# Patient Record
Sex: Female | Born: 1955
Health system: Southern US, Community
[De-identification: ages and names within clinical notes are randomized; demographics above are authoritative.]

## PROBLEM LIST (undated history)

## (undated) DIAGNOSIS — I341 Nonrheumatic mitral (valve) prolapse: Secondary | ICD-10-CM

## (undated) DIAGNOSIS — R9431 Abnormal electrocardiogram [ECG] [EKG]: Secondary | ICD-10-CM

## (undated) DIAGNOSIS — R202 Paresthesia of skin: Secondary | ICD-10-CM

## (undated) DIAGNOSIS — F319 Bipolar disorder, unspecified: Secondary | ICD-10-CM

## (undated) DIAGNOSIS — Z85828 Personal history of other malignant neoplasm of skin: Secondary | ICD-10-CM

## (undated) DIAGNOSIS — Z8619 Personal history of other infectious and parasitic diseases: Secondary | ICD-10-CM

## (undated) DIAGNOSIS — F32A Depression, unspecified: Secondary | ICD-10-CM

## (undated) DIAGNOSIS — B009 Herpesviral infection, unspecified: Secondary | ICD-10-CM

## (undated) DIAGNOSIS — U071 COVID-19: Secondary | ICD-10-CM

## (undated) DIAGNOSIS — K219 Gastro-esophageal reflux disease without esophagitis: Secondary | ICD-10-CM

## (undated) DIAGNOSIS — F419 Anxiety disorder, unspecified: Secondary | ICD-10-CM

## (undated) DIAGNOSIS — J42 Unspecified chronic bronchitis: Secondary | ICD-10-CM

## (undated) DIAGNOSIS — T7840XA Allergy, unspecified, initial encounter: Secondary | ICD-10-CM

## (undated) DIAGNOSIS — F329 Major depressive disorder, single episode, unspecified: Secondary | ICD-10-CM

## (undated) DIAGNOSIS — I471 Supraventricular tachycardia, unspecified: Secondary | ICD-10-CM

## (undated) DIAGNOSIS — M1990 Unspecified osteoarthritis, unspecified site: Secondary | ICD-10-CM

## (undated) DIAGNOSIS — H269 Unspecified cataract: Secondary | ICD-10-CM

## (undated) DIAGNOSIS — M810 Age-related osteoporosis without current pathological fracture: Secondary | ICD-10-CM

## (undated) DIAGNOSIS — R413 Other amnesia: Secondary | ICD-10-CM

## (undated) DIAGNOSIS — C801 Malignant (primary) neoplasm, unspecified: Secondary | ICD-10-CM

## (undated) DIAGNOSIS — S2239XA Fracture of one rib, unspecified side, initial encounter for closed fracture: Secondary | ICD-10-CM

## (undated) HISTORY — DX: Abnormal electrocardiogram (ECG) (EKG): R94.31

## (undated) HISTORY — PX: RHINOPLASTY: SHX2354

## (undated) HISTORY — DX: Age-related osteoporosis without current pathological fracture: M81.0

## (undated) HISTORY — DX: COVID-19: U07.1

## (undated) HISTORY — DX: Personal history of other malignant neoplasm of skin: Z85.828

## (undated) HISTORY — DX: Nonrheumatic mitral (valve) prolapse: I34.1

## (undated) HISTORY — DX: Gastro-esophageal reflux disease without esophagitis: K21.9

## (undated) HISTORY — DX: Herpesviral infection, unspecified: B00.9

## (undated) HISTORY — DX: Allergy, unspecified, initial encounter: T78.40XA

## (undated) HISTORY — PX: MOHS SURGERY: SHX181

## (undated) HISTORY — DX: Supraventricular tachycardia, unspecified: I47.10

## (undated) HISTORY — DX: Major depressive disorder, single episode, unspecified: F32.9

## (undated) HISTORY — DX: Paresthesia of skin: R20.2

## (undated) HISTORY — DX: Fracture of one rib, unspecified side, initial encounter for closed fracture: S22.39XA

## (undated) HISTORY — DX: Personal history of other infectious and parasitic diseases: Z86.19

## (undated) HISTORY — DX: Unspecified cataract: H26.9

## (undated) HISTORY — DX: Malignant (primary) neoplasm, unspecified: C80.1

## (undated) HISTORY — DX: Depression, unspecified: F32.A

## (undated) HISTORY — DX: Other amnesia: R41.3

## (undated) HISTORY — PX: TUBAL LIGATION: SHX77

## (undated) HISTORY — DX: Anxiety disorder, unspecified: F41.9

## (undated) HISTORY — DX: Bipolar disorder, unspecified: F31.9

## (undated) HISTORY — DX: Unspecified chronic bronchitis: J42

## (undated) HISTORY — DX: Unspecified osteoarthritis, unspecified site: M19.90

---

## 1978-09-30 DIAGNOSIS — Z85828 Personal history of other malignant neoplasm of skin: Secondary | ICD-10-CM

## 1978-09-30 HISTORY — DX: Personal history of other malignant neoplasm of skin: Z85.828

## 1999-05-04 ENCOUNTER — Other Ambulatory Visit: Admission: RE | Admit: 1999-05-04 | Discharge: 1999-05-04 | Payer: Self-pay | Admitting: *Deleted

## 1999-06-08 ENCOUNTER — Ambulatory Visit (HOSPITAL_COMMUNITY): Admission: RE | Admit: 1999-06-08 | Discharge: 1999-06-08 | Payer: Self-pay | Admitting: *Deleted

## 2000-05-26 ENCOUNTER — Other Ambulatory Visit: Admission: RE | Admit: 2000-05-26 | Discharge: 2000-05-26 | Payer: Self-pay | Admitting: *Deleted

## 2000-10-28 ENCOUNTER — Emergency Department (HOSPITAL_COMMUNITY): Admission: EM | Admit: 2000-10-28 | Discharge: 2000-10-28 | Payer: Self-pay | Admitting: Emergency Medicine

## 2001-01-05 ENCOUNTER — Encounter: Payer: Self-pay | Admitting: Internal Medicine

## 2001-01-05 ENCOUNTER — Ambulatory Visit (HOSPITAL_COMMUNITY): Admission: RE | Admit: 2001-01-05 | Discharge: 2001-01-05 | Payer: Self-pay | Admitting: Internal Medicine

## 2001-01-12 ENCOUNTER — Ambulatory Visit (HOSPITAL_BASED_OUTPATIENT_CLINIC_OR_DEPARTMENT_OTHER): Admission: RE | Admit: 2001-01-12 | Discharge: 2001-01-12 | Payer: Self-pay | Admitting: *Deleted

## 2001-01-12 ENCOUNTER — Encounter (INDEPENDENT_AMBULATORY_CARE_PROVIDER_SITE_OTHER): Payer: Self-pay | Admitting: *Deleted

## 2001-03-05 ENCOUNTER — Encounter (INDEPENDENT_AMBULATORY_CARE_PROVIDER_SITE_OTHER): Payer: Self-pay | Admitting: Specialist

## 2001-03-05 ENCOUNTER — Ambulatory Visit (HOSPITAL_BASED_OUTPATIENT_CLINIC_OR_DEPARTMENT_OTHER): Admission: RE | Admit: 2001-03-05 | Discharge: 2001-03-05 | Payer: Self-pay | Admitting: *Deleted

## 2001-07-13 ENCOUNTER — Other Ambulatory Visit: Admission: RE | Admit: 2001-07-13 | Discharge: 2001-07-13 | Payer: Self-pay | Admitting: *Deleted

## 2001-11-24 ENCOUNTER — Ambulatory Visit (HOSPITAL_COMMUNITY): Admission: RE | Admit: 2001-11-24 | Discharge: 2001-11-24 | Payer: Self-pay | Admitting: *Deleted

## 2001-11-24 ENCOUNTER — Encounter: Payer: Self-pay | Admitting: *Deleted

## 2001-12-08 ENCOUNTER — Encounter: Payer: Self-pay | Admitting: *Deleted

## 2001-12-08 ENCOUNTER — Ambulatory Visit (HOSPITAL_COMMUNITY): Admission: RE | Admit: 2001-12-08 | Discharge: 2001-12-08 | Payer: Self-pay | Admitting: *Deleted

## 2003-04-21 ENCOUNTER — Other Ambulatory Visit: Admission: RE | Admit: 2003-04-21 | Discharge: 2003-04-21 | Payer: Self-pay | Admitting: *Deleted

## 2004-09-26 ENCOUNTER — Ambulatory Visit: Payer: Self-pay | Admitting: Pulmonary Disease

## 2005-02-28 ENCOUNTER — Ambulatory Visit: Payer: Self-pay | Admitting: Pulmonary Disease

## 2005-03-01 ENCOUNTER — Ambulatory Visit: Payer: Self-pay | Admitting: Pulmonary Disease

## 2005-04-30 ENCOUNTER — Ambulatory Visit: Payer: Self-pay | Admitting: Internal Medicine

## 2006-09-03 ENCOUNTER — Ambulatory Visit: Payer: Self-pay | Admitting: Pulmonary Disease

## 2007-06-19 ENCOUNTER — Ambulatory Visit: Payer: Self-pay | Admitting: Pulmonary Disease

## 2007-06-19 DIAGNOSIS — R209 Unspecified disturbances of skin sensation: Secondary | ICD-10-CM | POA: Insufficient documentation

## 2007-06-19 DIAGNOSIS — K589 Irritable bowel syndrome without diarrhea: Secondary | ICD-10-CM | POA: Insufficient documentation

## 2007-06-19 DIAGNOSIS — J42 Unspecified chronic bronchitis: Secondary | ICD-10-CM | POA: Insufficient documentation

## 2007-06-19 DIAGNOSIS — Z8619 Personal history of other infectious and parasitic diseases: Secondary | ICD-10-CM | POA: Insufficient documentation

## 2007-06-19 DIAGNOSIS — F411 Generalized anxiety disorder: Secondary | ICD-10-CM | POA: Insufficient documentation

## 2007-06-19 DIAGNOSIS — B009 Herpesviral infection, unspecified: Secondary | ICD-10-CM | POA: Insufficient documentation

## 2007-06-19 DIAGNOSIS — R9431 Abnormal electrocardiogram [ECG] [EKG]: Secondary | ICD-10-CM | POA: Insufficient documentation

## 2007-06-19 DIAGNOSIS — J309 Allergic rhinitis, unspecified: Secondary | ICD-10-CM | POA: Insufficient documentation

## 2007-06-19 DIAGNOSIS — Z85828 Personal history of other malignant neoplasm of skin: Secondary | ICD-10-CM | POA: Insufficient documentation

## 2007-06-19 DIAGNOSIS — R413 Other amnesia: Secondary | ICD-10-CM | POA: Insufficient documentation

## 2007-06-19 DIAGNOSIS — I059 Rheumatic mitral valve disease, unspecified: Secondary | ICD-10-CM | POA: Insufficient documentation

## 2007-06-19 LAB — CONVERTED CEMR LAB
ALT: 15 units/L (ref 0–35)
Alkaline Phosphatase: 85 units/L (ref 39–117)
Basophils Absolute: 0 10*3/uL (ref 0.0–0.1)
Bilirubin Urine: NEGATIVE
CO2: 32 meq/L (ref 19–32)
Cholesterol: 172 mg/dL (ref 0–200)
Eosinophils Absolute: 0.1 10*3/uL (ref 0.0–0.6)
Glucose, Bld: 91 mg/dL (ref 70–99)
HCT: 37.5 % (ref 36.0–46.0)
Ketones, ur: NEGATIVE mg/dL
MCHC: 34.8 g/dL (ref 30.0–36.0)
Monocytes Absolute: 0.5 10*3/uL (ref 0.2–0.7)
Neutro Abs: 4 10*3/uL (ref 1.4–7.7)
Nitrite: NEGATIVE
RBC: 4.21 M/uL (ref 3.87–5.11)
Sodium: 144 meq/L (ref 135–145)
Total Bilirubin: 0.7 mg/dL (ref 0.3–1.2)
Total CHOL/HDL Ratio: 2.4
Total Protein: 7.7 g/dL (ref 6.0–8.3)
Urobilinogen, UA: 0.2 (ref 0.0–1.0)
WBC: 5.9 10*3/uL (ref 4.5–10.5)
pH: 6 (ref 5.0–8.0)

## 2007-07-27 ENCOUNTER — Ambulatory Visit: Payer: Self-pay | Admitting: Pulmonary Disease

## 2007-07-27 LAB — CONVERTED CEMR LAB
ALT: 14 units/L (ref 0–35)
Alkaline Phosphatase: 81 units/L (ref 39–117)
Bilirubin Urine: NEGATIVE
Calcium: 9.6 mg/dL (ref 8.4–10.5)
Creatinine, Ser: 0.7 mg/dL (ref 0.4–1.2)
GFR calc Af Amer: 113 mL/min
GFR calc non Af Amer: 94 mL/min
Ketones, ur: 15 mg/dL — AB
Urine Glucose: NEGATIVE mg/dL
Urobilinogen, UA: 0.2 (ref 0.0–1.0)
pH: 6 (ref 5.0–8.0)

## 2007-07-28 ENCOUNTER — Ambulatory Visit: Payer: Self-pay | Admitting: Cardiology

## 2007-07-28 ENCOUNTER — Inpatient Hospital Stay (HOSPITAL_COMMUNITY): Admission: EM | Admit: 2007-07-28 | Discharge: 2007-07-31 | Payer: Self-pay | Admitting: Emergency Medicine

## 2007-07-28 ENCOUNTER — Encounter (INDEPENDENT_AMBULATORY_CARE_PROVIDER_SITE_OTHER): Payer: Self-pay | Admitting: General Surgery

## 2007-08-01 HISTORY — PX: APPENDECTOMY: SHX54

## 2007-08-03 ENCOUNTER — Ambulatory Visit: Payer: Self-pay | Admitting: Pulmonary Disease

## 2007-08-03 LAB — CONVERTED CEMR LAB
BUN: 10 mg/dL (ref 6–23)
CO2: 30 meq/L (ref 19–32)
Calcium: 9.4 mg/dL (ref 8.4–10.5)
Creatinine, Ser: 0.7 mg/dL (ref 0.4–1.2)
GFR calc non Af Amer: 94 mL/min
Glucose, Bld: 79 mg/dL (ref 70–99)
Potassium: 4.8 meq/L (ref 3.5–5.1)

## 2007-08-04 ENCOUNTER — Ambulatory Visit: Payer: Self-pay | Admitting: Cardiology

## 2007-08-10 ENCOUNTER — Encounter: Payer: Self-pay | Admitting: Cardiology

## 2007-08-10 ENCOUNTER — Ambulatory Visit: Payer: Self-pay

## 2007-08-21 ENCOUNTER — Telehealth (INDEPENDENT_AMBULATORY_CARE_PROVIDER_SITE_OTHER): Payer: Self-pay | Admitting: *Deleted

## 2007-08-21 ENCOUNTER — Ambulatory Visit: Payer: Self-pay | Admitting: Pulmonary Disease

## 2007-08-21 LAB — CONVERTED CEMR LAB
Bilirubin Urine: NEGATIVE
Hemoglobin, Urine: NEGATIVE
Ketones, ur: NEGATIVE mg/dL
Leukocytes, UA: NEGATIVE
Specific Gravity, Urine: <=1.005 (ref 1.000–1.03)
Total Protein, Urine: NEGATIVE mg/dL
Urine Glucose: NEGATIVE mg/dL
pH: 6 (ref 5.0–8.0)

## 2007-09-03 ENCOUNTER — Ambulatory Visit: Payer: Self-pay | Admitting: Cardiology

## 2007-09-17 ENCOUNTER — Encounter: Payer: Self-pay | Admitting: Pulmonary Disease

## 2007-09-28 ENCOUNTER — Encounter: Payer: Self-pay | Admitting: Pulmonary Disease

## 2007-10-15 ENCOUNTER — Encounter: Payer: Self-pay | Admitting: Pulmonary Disease

## 2008-06-14 ENCOUNTER — Ambulatory Visit: Payer: Self-pay | Admitting: Internal Medicine

## 2008-06-27 ENCOUNTER — Ambulatory Visit: Payer: Self-pay | Admitting: Internal Medicine

## 2008-07-12 ENCOUNTER — Ambulatory Visit: Payer: Self-pay | Admitting: Pulmonary Disease

## 2008-08-08 ENCOUNTER — Ambulatory Visit: Payer: Self-pay | Admitting: Internal Medicine

## 2009-04-26 ENCOUNTER — Ambulatory Visit: Payer: Self-pay | Admitting: Pulmonary Disease

## 2009-06-26 ENCOUNTER — Ambulatory Visit: Payer: Self-pay | Admitting: Pulmonary Disease

## 2009-08-15 ENCOUNTER — Telehealth (INDEPENDENT_AMBULATORY_CARE_PROVIDER_SITE_OTHER): Payer: Self-pay | Admitting: *Deleted

## 2009-08-17 ENCOUNTER — Ambulatory Visit: Payer: Self-pay | Admitting: Pulmonary Disease

## 2009-08-22 ENCOUNTER — Ambulatory Visit: Payer: Self-pay | Admitting: Pulmonary Disease

## 2009-08-22 LAB — CONVERTED CEMR LAB
Alkaline Phosphatase: 63 units/L (ref 39–117)
BUN: 10 mg/dL (ref 6–23)
Basophils Absolute: 0 10*3/uL (ref 0.0–0.1)
Basophils Relative: 0.9 % (ref 0.0–3.0)
Bilirubin, Direct: 0.1 mg/dL (ref 0.0–0.3)
Calcium: 9.3 mg/dL (ref 8.4–10.5)
Cholesterol: 162 mg/dL (ref 0–200)
Creatinine, Ser: 0.7 mg/dL (ref 0.4–1.2)
GFR calc non Af Amer: 92.72 mL/min (ref >60)
HDL: 70.2 mg/dL (ref >39.00)
Hemoglobin: 13.1 g/dL (ref 12.0–15.0)
MCHC: 33.5 g/dL (ref 30.0–36.0)
Monocytes Absolute: 0.5 10*3/uL (ref 0.1–1.0)
Monocytes Relative: 11.7 % (ref 3.0–12.0)
Neutro Abs: 1.9 10*3/uL (ref 1.4–7.7)
Neutrophils Relative %: 48.9 % (ref 43.0–77.0)
RDW: 12.9 % (ref 11.5–14.6)
Sodium: 143 meq/L (ref 135–145)
Triglycerides: 62 mg/dL (ref 0.0–149.0)
VLDL: 12.4 mg/dL (ref 0.0–40.0)

## 2009-10-06 ENCOUNTER — Telehealth: Payer: Self-pay | Admitting: Pulmonary Disease

## 2009-10-06 DIAGNOSIS — M549 Dorsalgia, unspecified: Secondary | ICD-10-CM | POA: Insufficient documentation

## 2009-10-07 ENCOUNTER — Encounter: Payer: Self-pay | Admitting: Pulmonary Disease

## 2010-07-02 ENCOUNTER — Telehealth (INDEPENDENT_AMBULATORY_CARE_PROVIDER_SITE_OTHER): Payer: Self-pay | Admitting: *Deleted

## 2010-07-02 ENCOUNTER — Ambulatory Visit: Payer: Self-pay | Admitting: Pulmonary Disease

## 2010-07-02 DIAGNOSIS — J209 Acute bronchitis, unspecified: Secondary | ICD-10-CM | POA: Insufficient documentation

## 2010-09-13 ENCOUNTER — Ambulatory Visit: Payer: Self-pay | Admitting: Pulmonary Disease

## 2010-10-10 ENCOUNTER — Telehealth: Payer: Self-pay | Admitting: Pulmonary Disease

## 2010-10-15 ENCOUNTER — Ambulatory Visit
Admission: RE | Admit: 2010-10-15 | Discharge: 2010-10-15 | Payer: Self-pay | Source: Home / Self Care | Attending: Pulmonary Disease | Admitting: Pulmonary Disease

## 2010-10-18 ENCOUNTER — Other Ambulatory Visit: Payer: Self-pay | Admitting: Pulmonary Disease

## 2010-10-18 ENCOUNTER — Encounter: Payer: Self-pay | Admitting: Pulmonary Disease

## 2010-10-18 ENCOUNTER — Ambulatory Visit
Admission: RE | Admit: 2010-10-18 | Discharge: 2010-10-18 | Payer: Self-pay | Source: Home / Self Care | Attending: Pulmonary Disease | Admitting: Pulmonary Disease

## 2010-10-18 LAB — BASIC METABOLIC PANEL
BUN: 12 mg/dL (ref 6–23)
CO2: 28 mEq/L (ref 19–32)
Calcium: 8.9 mg/dL (ref 8.4–10.5)
Chloride: 104 mEq/L (ref 96–112)
Creatinine, Ser: 0.6 mg/dL (ref 0.4–1.2)
GFR: 106.19 mL/min (ref >60.00)
Glucose, Bld: 81 mg/dL (ref 70–99)
Potassium: 4.7 mEq/L (ref 3.5–5.1)
Sodium: 138 mEq/L (ref 135–145)

## 2010-10-18 LAB — CBC WITH DIFFERENTIAL/PLATELET
Basophils Absolute: 0.1 10*3/uL (ref 0.0–0.1)
Basophils Relative: 2.1 % (ref 0.0–3.0)
Eosinophils Absolute: 0.1 10*3/uL (ref 0.0–0.7)
Eosinophils Relative: 2.3 % (ref 0.0–5.0)
HCT: 39 % (ref 36.0–46.0)
Hemoglobin: 13.4 g/dL (ref 12.0–15.0)
Lymphocytes Relative: 25 % (ref 12.0–46.0)
Lymphs Abs: 1.3 10*3/uL (ref 0.7–4.0)
MCHC: 34.3 g/dL (ref 30.0–36.0)
MCV: 91.3 fl (ref 78.0–100.0)
Monocytes Absolute: 0.3 10*3/uL (ref 0.1–1.0)
Monocytes Relative: 6.4 % (ref 3.0–12.0)
Neutro Abs: 3.2 10*3/uL (ref 1.4–7.7)
Neutrophils Relative %: 64.2 % (ref 43.0–77.0)
Platelets: 192 10*3/uL (ref 150.0–400.0)
RBC: 4.27 Mil/uL (ref 3.87–5.11)
RDW: 13.5 % (ref 11.5–14.6)
WBC: 5 10*3/uL (ref 4.5–10.5)

## 2010-10-18 LAB — TSH: TSH: 1.26 u[IU]/mL (ref 0.35–5.50)

## 2010-10-18 LAB — LIPID PANEL
Cholesterol: 179 mg/dL (ref 0–200)
HDL: 74.9 mg/dL (ref >39.00)
LDL Cholesterol: 86 mg/dL (ref 0–99)
Total CHOL/HDL Ratio: 2
Triglycerides: 89 mg/dL (ref 0.0–149.0)
VLDL: 17.8 mg/dL (ref 0.0–40.0)

## 2010-10-18 LAB — HEPATIC FUNCTION PANEL
ALT: 16 U/L (ref 0–35)
AST: 22 U/L (ref 0–37)
Albumin: 4.1 g/dL (ref 3.5–5.2)
Alkaline Phosphatase: 73 U/L (ref 39–117)
Bilirubin, Direct: 0.1 mg/dL (ref 0.0–0.3)
Total Bilirubin: 0.4 mg/dL (ref 0.3–1.2)
Total Protein: 7 g/dL (ref 6.0–8.3)

## 2010-10-21 ENCOUNTER — Encounter: Payer: Self-pay | Admitting: Pulmonary Disease

## 2010-10-29 ENCOUNTER — Telehealth: Payer: Self-pay | Admitting: Pulmonary Disease

## 2010-11-01 NOTE — Progress Notes (Signed)
Summary: SICK NEEDS APPT  Phone Note Call from Patient Call back at Home Phone [phone]   Caller: Patient Call For: NADEL Summary of Call: PT IS SICK [place] WANTS AN APPT HAS COUGH /SINUS/TEMP/ACHE TAMMY IS FULL EXCEPT NURSE SPOTS + [place]  Initial call taken by: Lacinda Axon,  July 02, 2010 11:04 AM  Follow-up for Phone Call        [facility] Vernie Murders  July 02, 2010 11:13 AM  Spoke with pt.  She c/o "chest cold" x 2 days- has prod cough with ? color sputum.  She also c/o hoarness and sore throat since this am and states that she has low grade fever. Wants appt today and nothing available.  Pls advise thanks! NKDA Follow-up by: Vernie Murders,  July 02, 2010 11:20 AM  Additional Follow-up for Phone Call Additional follow up Details #1::        per SN---ok to add her on this afternoon to be seen Randell Loop CMA  July 02, 2010 12:54 PM .  Jeanene Erb, spoke with pt.  Informed her SN can see her today at 4pm -- pt ok with this.  Appt scheduled.  Gweneth Dimitri RN  July 02, 2010 1:39 PM

## 2010-11-01 NOTE — Progress Notes (Signed)
Summary: back aches  Phone Note Call from Patient Call back at Home Phone [phone]   Caller: Patient Call For: Monique [name] Reason for Call: Talk to Nurse Summary of Call: 2 weeks got bad feeling in back, been using heat, bengay and trammadol - pain has gotten worse, please respond.  does she need to come in? Initial call taken by: Eugene Gavia,  October 06, 2009 11:22 AM  Follow-up for Phone Call        Pt states she was exercising on new years eve, she was doing positive negative resistance training and was doing lateral pull downs. After that shebagan to have low back pain that has gradually gotten worse over the last 2 weeks. Pt has been applying heat, and using tramadol as needed for pain without releif. Pelase advise.Carron Curie CMA  October 06, 2009 11:38 AM   Additional Follow-up for Phone Call Additional follow up Details #1::        per SN---he would rec eval by sports meds--ortho specialist--for xrays, etc...refer to [facility]---thanks Randell Loop CMA  October 06, 2009 11:53 AM   New Problems: BACK PAIN (ICD-724.5)   Additional Follow-up for Phone Call Additional follow up Details #2::    Pt advised and orders placed. Zackery Barefoot CMA  October 06, 2009 12:30 PM   New Problems: BACK PAIN (ICD-724.5)

## 2010-11-01 NOTE — Progress Notes (Signed)
Summary: appt w/ [name]  Phone Note Call from Patient Call back at Home Phone [phone]   Caller: Patient Call For: [name] Summary of Call: Patient saw Dr. Edwin Dada a week ago.  Patient had a chest xray done at visit and was later left message on machine that she needed to f/u w/ Dr. Kriste Basque concerning xray results.  She accidentally erased message so she can not go into further detail.  Patient needing appt. w/Juventino Pavone. Initial call taken by: Lehman Prom,  October 10, 2010 3:39 PM  Follow-up for Phone Call        called and spoke with pt and she stated that she was sick and went to urgent care at [facility]---they did cxr and told her to follow up with SN due to spot seen on her lung and pleural thickening.  appt made for pt on 1-16 at 57,  pt is aware Randell Loop [facility]  October 10, 2010 3:46 PM

## 2010-11-01 NOTE — Letter (Signed)
Summary: [facility]  [facility]   Imported By: Lester Homecroft 10/20/2009 08:25:06  _____________________________________________________________________  External Attachment:    Type:   Image     Comment:   External Document

## 2010-11-01 NOTE — Assessment & Plan Note (Signed)
Summary: Acute NP office visit - sinus inf   CC:  red/sore throat, dry cough, sinus pressure/congestion, watery eyes, sneezing, PND, clear nasal drainage, "white bumps" on the inside of mouth, and chills x3-4days.  History of Present Illness: 55 year old with known history of  recurrent bronchitis, mitral valve prolapse, bipolar disorder, and anxiety.   July 12, 2008--Complains of nasal congestion, post nasal drip, cough, thick mucus x 4 days- Strep pharygitis-tx with 10 day of Pen Vee K.   August 08, 2008 --returns for follow up. feeling much better. Still complains of intermittent nasal drip and watery eyes.   April 26, 2009--Acute office viist. Complains of sinus congestion with clear drainage, headaches, PND with hacking cough, runny nose, eyes watering x2weeks.   June 26, 2009--Presents for an acute office visit. Complains of sore/itchy throat, head congestion with clear/watery drainage, ears itching, prod cough with clear mucus, wheezing, increased SOB, body aches x3days. otc not helping. Denies chest pain, dyspnea, orthopnea, hemoptysis, fever, n/v/d, edema, headache.  September 13, 2010 --Presents for red/sore throat, dry cough, sinus pressure/congestion, watery eyes, sneezing, PND, clear nasal drainage, "white bumps" on the inside of mouth, chills x3-4days. Started w/ stuffy nose, drainage, and sore throat. Feels tired, no energy. OTC not helping. Denies chest pain,  orthopnea, hemoptysis, fever, n/v/d, edema, headache.   Medications Prior to Update: 1)  Zyrtec Allergy 10 Mg Tabs (Cetirizine Hcl) .... Take 1 Tab By Mouth Once Daily in The Am... 2)  Flonase 50 Mcg/act Susp (Fluticasone Propionate) .... 2 Sp in Each Nostril At Bedtime.Marland KitchenMarland Kitchen 3)  Mucinex Maximum Strength 1200 Mg Xr12h-Tab (Guaifenesin) .... Take 1 Tab By Mouth Two Times A Day W/ Fluids.Marland KitchenMarland Kitchen 4)  Tramadol Hcl 50 Mg Tabs (Tramadol Hcl) .... Take 1 Tab By Mouth Every 6-8 H As Needed For Pain... 5)  Os-Cal 500 + D 500-200  Mg-Unit  Tabs (Calcium Carbonate-Vitamin D) .Marland Kitchen.. 1 Tab Two Times A Day 6)  Multivitamins   Tabs (Multiple Vitamin) .Marland Kitchen.. 1 Tab Daily 7)  Zoloft 50 Mg Tabs (Sertraline Hcl) .... 1.5 Tabs By Mouth Once Daily 8)  Lorazepam 0.5 Mg  Tabs (Lorazepam) .... 1.5 Tabs At Bedtime As Needed 9)  Augmentin 875-125 Mg Tabs (Amoxicillin-Pot Clavulanate) .... Take 1 Tab By Mouth Two Times A Day Til Gone... 10)  Prednisone (Pak) 5 Mg Tabs (Prednisone) .... Take As Directed 11)  Tussionex Pennkinetic Er 10-8 Mg/58ml Lqcr (Hydrocod Polst-Chlorphen Polst) .Marland Kitchen.. 1 Tsp By Mouth Two Times A Day As Needed For Cough...  Current Medications (verified): 1)  Zyrtec Allergy 10 Mg Tabs (Cetirizine Hcl) .... Take 1 Tab By Mouth Once Daily in The Am... 2)  Flonase 50 Mcg/act Susp (Fluticasone Propionate) .... 2 Sp in Each Nostril At Bedtime.Marland KitchenMarland Kitchen 3)  Mucinex Maximum Strength 1200 Mg Xr12h-Tab (Guaifenesin) .... Take 1 Tab By Mouth Two Times A Day W/ Fluids.Marland KitchenMarland Kitchen 4)  Tramadol Hcl 50 Mg Tabs (Tramadol Hcl) .... Take 1 Tab By Mouth Every 6-8 H As Needed For Pain... 5)  Os-Cal 500 + D 500-200 Mg-Unit  Tabs (Calcium Carbonate-Vitamin D) .Marland Kitchen.. 1 Tab Two Times A Day 6)  Multivitamins   Tabs (Multiple Vitamin) .Marland Kitchen.. 1 Tab Daily 7)  Zoloft 50 Mg Tabs (Sertraline Hcl) .... 1.5 Tabs By Mouth Once Daily 8)  Lorazepam 1 Mg Tabs (Lorazepam) .Marland Kitchen.. 1 - 1.5 Tabs By Mouth At Bedtime As Needed 9)  Tussionex Pennkinetic Er 10-8 Mg/8ml Lqcr (Hydrocod Polst-Chlorphen Polst) .Marland Kitchen.. 1 Tsp By Mouth Two Times A Day As  Needed For Cough...  Allergies (verified): No Known Drug Allergies  Past History:  Past Medical History: Last updated: 07/02/2010 RHINITIS, ALLERGIC NOS (ICD-477.9) BRONCHITIS, RECURRENT (ICD-491.9) MITRAL VALVE PROLAPSE (ICD-424.0) ABNORMAL ELECTROCARDIOGRAM (ICD-794.31) Hx of HEPATITIS C (ICD-070.51) IRRITABLE BOWEL SYNDROME (ICD-564.1) Family Hx of CANCER, COLORECTAL (ICD-154.0) HSV (ICD-054.9) SYMPTOM, MEMORY LOSS (ICD-780.93) Hx of  PARESTHESIA (ICD-782.0) DISORDER, BIPOLAR NOS (ICD-296.80) ANXIETY (ICD-300.00) SKIN CANCER, HX OF (ICD-V10.83)  Past Surgical History: Last updated: 07/02/2010 Rhinoplasty - 1983  Bilat Tubal Ligation S/P appendectomy 11/08  Family History: Last updated: 06/26/2009 emphysema - mother has COPD, pat uncle allergies - mother, father asthma - asthma heart disease - mother has a-fib, CHF rheumatism - father cancer - mother (colon), father (bladder) hypercholesterolemia - brother  Social History: Last updated: 07/02/2010 former smoker.  quit in 1998, 1ppd x15yrs. rare alcohol 1 cup caffeine daily. married no children  Risk Factors: Smoking Status: quit (07/02/2010)  Review of Systems      See HPI  Vital Signs:  Patient profile:   55 year old female Height:      67.5 inches Weight:      125.13 pounds BMI:     19.38 O2 Sat:      97 % on Room air Temp:     98.7 degrees F oral Pulse rate:   73 / minute BP sitting:   114 / 66  (left arm) Cuff size:   regular  Vitals Entered By: Boone Master CNA/MA (September 13, 2010 3:53 PM)  O2 Flow:  Room air  Physical Exam  Additional Exam:  GEN: A/Ox3; pleasant , NAD HEENT:  [place]/AT, , EACs-clear, TMs-wnl, NOSE-clear discharge, , non tender sinus, THROAT-clear NECK:  Supple w/ fair ROM; no JVD; normal carotid impulses w/o bruits; no thyromegaly or nodules palpated; no lymphadenopathy. CHEST:  CTA w/ no wheezing  HEART:  RRR, no m/r/g   ABDOMEN:  Soft & nt; nml bowel sounds; no organomegaly or masses detected. EXT: Warm bil,  no calf pain, edema, clubbing, pulses intact     Impression & Recommendations:  Problem # 1:  URI (ICD-465.9)  Mucinex DM two times a day as needed cough/congestion Saline nasal rinses as needed  Tylenol cold and sinus as needed sinus congestion  Increase fluids and rest.  Majic mouthwash as needed sore throat Omnicef 300mg  two times a day to have on hold if symptoms worsen with discolored mucus.    Please contact office for sooner follow up if symptoms do not improve or worsen  Her updated medication list for this problem includes:    Zyrtec Allergy 10 Mg Tabs (Cetirizine hcl) .Marland Kitchen... Take 1 tab by mouth once daily in the am...    Mucinex Maximum Strength 1200 Mg Xr12h-tab (Guaifenesin) .Marland Kitchen... Take 1 tab by mouth two times a day w/ fluids...    Tussionex Pennkinetic Er 10-8 Mg/76ml Lqcr (Hydrocod polst-chlorphen polst) .Marland Kitchen... 1 tsp by mouth two times a day as needed for cough...  Orders: Est. Patient Level III (40981)  Medications Added to Medication List This Visit: 1)  Lorazepam 1 Mg Tabs (Lorazepam) .Marland Kitchen.. 1 - 1.5 tabs by mouth at bedtime as needed 2)  First-dukes Mouthwash Susp (Diphenhyd-hydrocort-nystatin) .Marland Kitchen.. 1 tsp four times a day as needed sore throat, swish/swallow. 3)  Cefdinir 300 Mg Caps (Cefdinir) .Marland Kitchen.. 1 by mouth two times a day  Patient Instructions: 1)  Mucinex DM two times a day as needed cough/congestion 2)  Saline nasal rinses as needed  3)  Tylenol cold and sinus  as needed sinus congestion  4)  Increase fluids and rest.  5)  Majic mouthwash as needed sore throat 6)  Omnicef 300mg  two times a day to have on hold if symptoms worsen with discolored mucus.  7)  Please contact office for sooner follow up if symptoms do not improve or worsen  Prescriptions: CEFDINIR 300 MG CAPS (CEFDINIR) 1 by mouth two times a day  #14 x 0   Entered and Authorized by:   Rubye Oaks NP   Signed by:   Tammy Parrett NP on 09/13/2010   Method used:   Print then Give to Patient   RxID:   [id number] FIRST-DUKES MOUTHWASH  SUSP (DIPHENHYD-HYDROCORT-NYSTATIN) 1 tsp four times a day as needed sore throat, swish/swallow.  #4 oz x 0   Entered and Authorized by:   Rubye Oaks NP   Signed by:   Rubye Oaks NP on 09/13/2010   Method used:   Electronically to        [facility] (retail)       [address]       [place], [place]  [id number]       Ph: [phone]        Fax: [phone]   RxID:   [id number]    Immunization History:  Influenza Immunization History:    Influenza:  historical (06/30/2010)

## 2010-11-01 NOTE — Assessment & Plan Note (Signed)
Summary: chest cold - ok per [place] / cj   CC:  11 month ROV & add-on for URI....  History of Present Illness: 55 y/o WF here for a follow up visit... he has multiple medical problems as noted below...   ~  July 02, 2010:  Yearly ROV & add-on for URI symptoms> started w/sinus pressure, head & chest congestion, dry hacking cough, ?fever & chills, +swollen glands, and chest tightness x several days... taking OTC meds w/o relief & we discussed checking CXR (chr changes, NAD), & Rx w/ Depo/ dosepak/ Augmentin/ Tussionex... otherw notes stable> hx MVP- no CP, rare palpit, denies SOB etc... GI stable and she had her f/u colonoscopy 9/09> neg, no polyps & f/u 38yrs...  she still sees [name] on Zoloft & Lorazepam...    Problem List:  RHINITIS, ALLERGIC NOS (ICD-477.9) - uses Nasacort AQ as needed... she notes sinus & allergy problems on & off...  BRONCHITIS, RECURRENT (ICD-491.9) - exsmoker quit 1990's (20 yrs of up to 1.5ppd prior to that), uses Mucinex as needed  ~  CXR 10/11 showed sl hyperinflation, mild incr markings & apical scarring, NAD...  MITRAL VALVE PROLAPSE (ICD-424.0) - she has MSC on exam... she notes rare palpit (self limited), & Echo 1986 w/ mild MVP.  ~  2DEcho 11/08 showed norm LV w/ EF= 55-65% & no regional wall motion abn, mild thickening of MV w/ mild MR...  ABNORMAL ELECTROCARDIOGRAM (ICD-794.31) - poor R wave progression V1-V3, no known CAD  Hx of HEPATITIS C (ICD-070.51) - Hx IV drug abuse as teen; Abn LFT's in late 80's/ early 90's; liver Bx 93 w/ chr persist hepatitis w/o necrosis (DrPatterson); serology pos for HepB antibodies, neg HBSAg, pos HepCAb; had pos HepC PCR 2002 (genotype 2B) & Rx w/ interferon 9/02-3/03 ([name]) w/ complete disappearance of HepC viral RNA; all subseq tests have shown - nl LFT's,  pos HepCAb, & neg HepC RNA Quant PCR.  ~  labs 11/10 showed normal LFTs  IRRITABLE BOWEL SYNDROME (ICD-564.1)  Family Hx of CANCER, COLORECTAL (ICD-154.0) -  Mother w/ colon Ca age 22 (resected, no known recur); pt had normal colonoscopy 4/02, and f/u colonoscopy 9/09 was also neg- no polyps w/ f/u sched 2yrs...  HSV (ICD-054.9) - lab eval 5/08 by gyn [name])  SYMPTOM, MEMORY LOSS (ICD-780.93) - new complaint 9/08 w/ essent nl MMSE, refer to neuro fo rfurther testing per pt request... seen by [name] who felt her memory disturbance, fatigue, & pain were c/w Fibromyalgia> EEG was normal, MRI brain was normal, Sleep Study was relatively unremarkable, and Labs were neg including Lyme titer... Rx'd w/ Seroquel 50mg  Qhs, tried Provigil10mg /d, w/ consideration for use of Cymbalta or Adderall depending on response.  Hx of PARESTHESIA (ICD-782.0) - eval by neuro ([name]) nothing found.  DISORDER, BIPOLAR NOS (ICD-296.80) - Rx by [name] on Lamictal prev- off now &  taking ZOLOFT 100mg /d & LORAZEPAM 0.5mg - taking 1.5 tabs at bedtime...  ANXIETY (ICD-300.00) - takes Lorazepam as noted...  SKIN CANCER, HX OF (ICD-V10.83) - several basal cell carcinomas removed in past.   Preventive Screening-Counseling & Management  Alcohol-Tobacco     Smoking Status: quit     Year Started: 1974     Year Quit: 1999     Pack years: 1ppd x 20 yrs  Allergies (verified): No Known Drug Allergies  Comments:  Nurse/Medical Assistant: The patient's medications and allergies were reviewed with the patient and were updated in the Medication and Allergy Lists.  Past History:  Past  Medical History: RHINITIS, ALLERGIC NOS (ICD-477.9) BRONCHITIS, RECURRENT (ICD-491.9) MITRAL VALVE PROLAPSE (ICD-424.0) ABNORMAL ELECTROCARDIOGRAM (ICD-794.31) Hx of HEPATITIS C (ICD-070.51) IRRITABLE BOWEL SYNDROME (ICD-564.1) Family Hx of CANCER, COLORECTAL (ICD-154.0) HSV (ICD-054.9) SYMPTOM, MEMORY LOSS (ICD-780.93) Hx of PARESTHESIA (ICD-782.0) DISORDER, BIPOLAR NOS (ICD-296.80) ANXIETY (ICD-300.00) SKIN CANCER, HX OF (ICD-V10.83)  Past Surgical History: Rhinoplasty -  1983  Bilat Tubal Ligation S/P appendectomy 11/08  Family History: Reviewed history from 06/26/2009 and no changes required. emphysema - mother has COPD, pat uncle allergies - mother, father asthma - asthma heart disease - mother has a-fib, CHF rheumatism - father cancer - mother (colon), father (bladder) hypercholesterolemia - brother  Social History: Reviewed history from 04/26/2009 and no changes required. former smoker.  quit in 1998, 1ppd x72yrs. rare alcohol 1 cup caffeine daily. married no children  Review of Systems      See HPI       The patient complains of prolonged cough.  The patient denies anorexia, fever, weight loss, weight gain, vision loss, decreased hearing, hoarseness, chest pain, syncope, dyspnea on exertion, peripheral edema, headaches, hemoptysis, abdominal pain, melena, hematochezia, severe indigestion/heartburn, hematuria, incontinence, muscle weakness, suspicious skin lesions, transient blindness, difficulty walking, depression, unusual weight change, abnormal bleeding, enlarged lymph nodes, and angioedema.    Vital Signs:  Patient profile:   55 year old female Height:      67.5 inches Weight:      124.13 pounds BMI:     19.22 O2 Sat:      97 % on Room air Temp:     98.1 degrees F oral Pulse rate:   78 / minute BP sitting:   104 / 68  (left arm) Cuff size:   regular  Vitals Entered By: Randell Loop CMA (July 02, 2010 4:28 PM)  O2 Sat at Rest %:  97 O2 Flow:  Room air CC: 11 month ROV & add-on for URI... Is Patient Diabetic? No Pain Assessment Patient in pain? no      Comments no changes in meds today   Physical Exam  Additional Exam:  WD, WN, 55 y/o WF in NAD... GENERAL:  Alert & oriented; pleasant & cooperative... HEENT:  /AT, EOM-wnl, PERRLA, Fundi-benign, EACs-clear, TMs-wnl, NOSE-clear, THROAT-clear & wnl. NECK:  Supple w/ fairROM; no JVD; normal carotid impulses w/o bruits; no thyromegaly or nodules palpated; no  lymphadenopathy. CHEST:  Clear to P & A; without wheezes/ rales/ or rhonchi. HEART:  Regular Rhythm; without murmurs/ rubs/ or gallops. ABDOMEN:  Soft & nontender; normal bowel sounds; no organomegaly or masses detected. EXT: without deformities or arthritic changes; no varicose veins/ venous insuffic/ or edema; +triggers... NEURO:  CN's intact; motor testing normal; sensory testing normal; gait normal & balance OK. DERM:  No lesions noted; no rash etc...    CXR  Procedure date:  07/02/2010  Findings:      CHEST - 2 VIEW Comparison: 06/19/2007.   Findings: Chronic hyperinflation of the lungs.  Cardiac size and mediastinal contours are within normal limits.  Visualized tracheal air column is within normal limits.  Stable biapical pleural/parenchymal scarring.  No pneumothorax, pulmonary edema, pleural effusion or acute airspace opacity.  Diffuse increased interstitial markings are stable. No acute osseous abnormality identified.   IMPRESSION: Chronic pulmonary hyperinflation. No acute cardiopulmonary abnormality.   Read By:  Augusto Gamble,  M.D.   Impression & Recommendations:  Problem # 1:  ACUTE BRONCHITIS (ICD-466.0) She has an URI w/ sinus & bronchitic symptoms... CXR w/o acute changes... we discussed Rx  w/ Augmentin, Depo80, Pred dosepak, & Tussionex for Prn use... Her updated medication list for this problem includes:    Mucinex Maximum Strength 1200 Mg Xr12h-tab (Guaifenesin) .Marland Kitchen... Take 1 tab by mouth two times a day w/ fluids...    Augmentin 875-125 Mg Tabs (Amoxicillin-pot clavulanate) .Marland Kitchen... Take 1 tab by mouth two times a day til gone...    Tussionex Pennkinetic Er 10-8 Mg/10ml Lqcr (Hydrocod polst-chlorphen polst) .Marland Kitchen... 1 tsp by mouth two times a day as needed for cough...  Orders: T-2 View CXR (71020TC) Depo- Medrol 80mg  (J1040) Admin of Therapeutic Inj  intramuscular or subcutaneous (78469)  Problem # 2:  MITRAL VALVE PROLAPSE (ICD-424.0) Stable w/o signif  symptoms other than rare palpit which remains self limited etc...  Problem # 3:  Hx of HEPATITIS C (ICD-070.51) GI remains stable> up to date on colon & followed by [name]...  Problem # 4:  SYMPTOM, MEMORY LOSS (ICD-780.93) Prev eval from [name] w/ numerous tests, normal MMSE, felt to be poss CFS/ FM & pt denies on going symptoms & states she's doing well w/ Yoga...  Problem # 5:  DISORDER, BIPOLAR NOS (ICD-296.80) She remains under the care of [name] on Zoloft & Lorazepam...  Problem # 6:  OTHER MEDICAL PROBLEMS AS NOTED>>>  Complete Medication List: 1)  Zyrtec Allergy 10 Mg Tabs (Cetirizine hcl) .... Take 1 tab by mouth once daily in the am... 2)  Flonase 50 Mcg/act Susp (Fluticasone propionate) .... 2 sp in each nostril at bedtime.Marland KitchenMarland Kitchen 3)  Mucinex Maximum Strength 1200 Mg Xr12h-tab (Guaifenesin) .... Take 1 tab by mouth two times a day w/ fluids.Marland KitchenMarland Kitchen 4)  Tramadol Hcl 50 Mg Tabs (Tramadol hcl) .... Take 1 tab by mouth every 6-8 h as needed for pain.Marland KitchenMarland Kitchen 5)  Os-cal 500 + D 500-200 Mg-unit Tabs (Calcium carbonate-vitamin d) .Marland Kitchen.. 1 tab two times a day 6)  Multivitamins Tabs (Multiple vitamin) .Marland Kitchen.. 1 tab daily 7)  Zoloft 50 Mg Tabs (Sertraline hcl) .... 1.5 tabs by mouth once daily 8)  Lorazepam 0.5 Mg Tabs (Lorazepam) .... 1.5 tabs at bedtime as needed 9)  Augmentin 875-125 Mg Tabs (Amoxicillin-pot clavulanate) .... Take 1 tab by mouth two times a day til gone... 10)  Prednisone (pak) 5 Mg Tabs (Prednisone) .... Take as directed 11)  Tussionex Pennkinetic Er 10-8 Mg/50ml Lqcr (Hydrocod polst-chlorphen polst) .Marland Kitchen.. 1 tsp by mouth two times a day as needed for cough...  Patient Instructions: 1)  Today we updated your med list- see below.... 2)  We decided to treat your bronchitis & URI w/ AUGMENTIN - take one tab twice daily til gone... 3)  We will reduce the bronchial tube inflammation w/ the Depo shot & a Pred dosepak- take as directed til gone... 4)  Finally you may use the Tussionex  as needed for cough... 5)  Today we did your follow up CXR- please call the "phone tree" in a few days for your results.Marland KitchenMarland Kitchen 6)  Rest at home, drink plenty of fluids, try some Tylenol/ Mucinex/ hot Tea etc... 7)  Call for any questions... Prescriptions: AUGMENTIN 875-125 MG TABS (AMOXICILLIN-POT CLAVULANATE) take 1 tab by mouth two times a day til gone...  #14 x 0   Entered and Authorized by:   Michele Mcalpine MD   Signed by:   Michele Mcalpine MD on 07/02/2010   Method used:   Print then Give to Patient   RxID:   [id number] TUSSIONEX PENNKINETIC ER 10-8 MG/5ML LQCR (HYDROCOD POLST-CHLORPHEN POLST) 1 tsp by  mouth two times a day as needed for cough...  #4 oz x 3   Entered and Authorized by:   Michele Mcalpine MD   Signed by:   Michele Mcalpine MD on 07/02/2010   Method used:   Print then Give to Patient   RxID:   [id number] PREDNISONE (PAK) 5 MG TABS (PREDNISONE) take as directed  #5mg  6dpack x 0   Entered and Authorized by:   Michele Mcalpine MD   Signed by:   Michele Mcalpine MD on 07/02/2010   Method used:   Print then Give to Patient   RxID:   [id number]    Immunization History:  Influenza Immunization History:    Influenza:  historical (07/10/2009)    Medication Administration  Injection # 1:    Medication: Depo- Medrol 80mg     Diagnosis: ACUTE BRONCHITIS (ICD-466.0)    Route: IM    Site: LUOQ gluteus    Exp Date: 12/2012    Lot #: [id number]    Mfr: [employer]    Patient tolerated injection without complications    Given by: Randell Loop CMA (July 02, 2010 5:37 PM)  Orders Added: 1)  Est. Patient Level V [84696] 2)  T-2 View CXR [71020TC] 3)  Depo- Medrol 80mg  [J1040] 4)  Admin of Therapeutic Inj  intramuscular or subcutaneous [96372]   Appended Document: chest cold - ok per [place] / cj "Phone Tree" message recorded... SN

## 2010-11-01 NOTE — Assessment & Plan Note (Signed)
Summary: follow up from cxr done at [facility]   CC:  3 month ROV & add-on for abn CXR....  History of Present Illness: 55 y/o WF here for a follow up visit... he has multiple medical problems as noted below...   ~  July 02, 2010:  Yearly ROV & add-on for URI symptoms> started w/sinus pressure, head & chest congestion, dry hacking cough, ?fever & chills, +swollen glands, and chest tightness x several days... taking OTC meds w/o relief & we discussed checking CXR (chr changes, NAD), & Rx w/ Depo/ dosepak/ Augmentin/ Tussionex... otherw notes stable> hx MVP- no CP, rare palpit, denies SOB etc... GI stable and she had her f/u colonoscopy 9/09> neg, no polyps & f/u 62yrs...  she still sees [name] on Zoloft & Lorazepam...   ~  October 15, 2010:  she went to [facility] 10/02/10 w/ URI "I couldn't talk" & had CXR showing biapical pleuroparenchymal scarring & rather than checking old films in the [facility] sys they scared the bejesus out of her thinking she had pneumonia, lung ca, or worse... we did CXR 10/11 w/ no ch in this area over a 54yr period & she is reassured... incidently- no CP, cough, sputum, f/ c/ s, etc.    Problem List:  RHINITIS, ALLERGIC NOS (ICD-477.9) - uses FLONASE & ZYRTEK as needed... she notes sinus & allergy problems on & off... she wonders about "allergies" & we discussed checking IgE & Rast > pending.  BRONCHITIS, RECURRENT (ICD-491.9) - exsmoker quit 1990's (20 yrs of up to 1.5ppd prior to that), uses Mucinex as needed  ~  CXR 10/11 showed sl hyperinflation, mild incr markings & apical scarring, NAD...  ~  1/12:  CXR report from [facility] sounds similar to our prev films (not avail for review) see above.  MITRAL VALVE PROLAPSE (ICD-424.0) - she has MSC on exam... she notes rare palpit (self limited), & Echo 1986 w/ mild MVP.  ~  2DEcho 11/08 showed norm LV w/ EF= 55-65% & no regional wall motion abn, mild thickening of MV w/ mild MR...  ABNORMAL ELECTROCARDIOGRAM (ICD-794.31) - poor R wave  progression V1-V3, no known CAD  Hx of HEPATITIS C (ICD-070.51) - Hx IV drug abuse as teen; Abn LFT's in late 80's/ early 90's; liver Bx 93 w/ chr persist hepatitis w/o necrosis (DrPatterson); serology pos for HepB antibodies, neg HBSAg, pos HepCAb; had pos HepC PCR 2002 (genotype 2B) & Rx w/ interferon 9/02-3/03 ([name]) w/ complete disappearance of HepC viral RNA; all subseq tests have shown - nl LFT's,  pos HepCAb, & neg HepC RNA Quant PCR.  ~  labs 11/10 showed normal LFTs  IRRITABLE BOWEL SYNDROME (ICD-564.1)  Family Hx of CANCER, COLORECTAL (ICD-154.0) - Mother w/ colon Ca age 66 (resected, no known recur); pt had normal colonoscopy 4/02, and f/u colonoscopy 9/09 was also neg- no polyps w/ f/u sched 2yrs...  HSV (ICD-054.9) - lab eval 5/08 by gyn [name])  SYMPTOM, MEMORY LOSS (ICD-780.93) - new complaint 9/08 w/ essent nl MMSE, refer to neuro fo rfurther testing per pt request... seen by [name] who felt her memory disturbance, fatigue, & pain were c/w Fibromyalgia> EEG was normal, MRI brain was normal, Sleep Study was relatively unremarkable, and Labs were neg including Lyme titer... Rx'd w/ Seroquel 50mg  Qhs, tried Provigil10mg /d, w/ consideration for use of Cymbalta or Adderall depending on response.  Hx of PARESTHESIA (ICD-782.0) - eval by neuro ([name]) nothing found... she uses TRAMADOL 50mg  Prn.  DISORDER, BIPOLAR NOS (ICD-296.80) - Rx  by [name] on Lamictal prev- off now &  taking ZOLOFT 100mg /d & LORAZEPAM 0.5mg - taking 1.5 tabs at bedtime...  ANXIETY (ICD-300.00) - takes Lorazepam as noted...  SKIN CANCER, HX OF (ICD-V10.83) - several basal cell carcinomas removed in past.  Health Maintenance:  ~  GI:  followed by [name]> colonoscopy 9/09 was neg- no polyps seen, f/u 71yrs.  ~  GYN:  followed by [name] for PAP, Mammograms, BMD (?when done last & ?results)- Rx Calcium, MVI, Vit D).  ~  Immunizations:  she declines the yearly seasonal Flu  vaccine...   Preventive Screening-Counseling & Management  Alcohol-Tobacco     Smoking Status: quit     Year Started: 1974     Year Quit: 1999     Pack years: 1ppd x 20 yrs  Allergies (verified): No Known Drug Allergies  Comments:  Nurse/Medical Assistant: The patient's medications and allergies were reviewed with the patient and were updated in the Medication and Allergy Lists.  Past History:  Past Medical History: RHINITIS, ALLERGIC NOS (ICD-477.9) BRONCHITIS, RECURRENT (ICD-491.9) MITRAL VALVE PROLAPSE (ICD-424.0) ABNORMAL ELECTROCARDIOGRAM (ICD-794.31) Hx of HEPATITIS C (ICD-070.51) IRRITABLE BOWEL SYNDROME (ICD-564.1) Family Hx of CANCER, COLORECTAL (ICD-154.0) HSV (ICD-054.9) SYMPTOM, MEMORY LOSS (ICD-780.93) Hx of PARESTHESIA (ICD-782.0) DISORDER, BIPOLAR NOS (ICD-296.80) ANXIETY (ICD-300.00) SKIN CANCER, HX OF (ICD-V10.83)  Past Surgical History: Rhinoplasty - 1983  Bilat Tubal Ligation S/P appendectomy 11/08  Family History: Reviewed history from 06/26/2009 and no changes required. emphysema - mother has COPD, pat uncle allergies - mother, father asthma - asthma heart disease - mother has a-fib, CHF rheumatism - father cancer - mother (colon), father (bladder) hypercholesterolemia - brother  Social History: Reviewed history from 07/02/2010 and no changes required. former smoker.  quit in 1998, 1ppd x36yrs. rare alcohol 1 cup caffeine daily. married no children  Review of Systems      See HPI       The patient complains of dyspnea on exertion.  The patient denies anorexia, fever, weight loss, weight gain, vision loss, decreased hearing, hoarseness, chest pain, syncope, peripheral edema, prolonged cough, headaches, hemoptysis, abdominal pain, melena, hematochezia, severe indigestion/heartburn, hematuria, incontinence, muscle weakness, suspicious skin lesions, transient blindness, difficulty walking, depression, unusual weight change, abnormal  bleeding, enlarged lymph nodes, and angioedema.    Vital Signs:  Patient profile:   55 year old female Height:      67.5 inches Weight:      124.38 pounds BMI:     19.26 O2 Sat:      98 % on Room air Temp:     97.3 degrees F oral Pulse rate:   83 / minute BP sitting:   116 / 64  (left arm) Cuff size:   regular  Vitals Entered By: Randell Loop CMA (October 15, 2010 10:53 AM)  O2 Sat at Rest %:  98 O2 Flow:  Room air CC: 3 month ROV & add-on for abn CXR... Is Patient Diabetic? No Pain Assessment Patient in pain? no      Comments meds updated today with pt   Physical Exam  Additional Exam:  WD, WN, 55 y/o WF in NAD... she is somewhat anxious. GENERAL:  Alert & oriented; pleasant & cooperative... HEENT:  /AT, EOM-wnl, PERRLA, Fundi-benign, EACs-clear, TMs-wnl, NOSE-clear, THROAT-clear & wnl. NECK:  Supple w/ fairROM; no JVD; normal carotid impulses w/o bruits; no thyromegaly or nodules palpated; no lymphadenopathy. CHEST:  Clear to P & A; without wheezes/ rales/ or rhonchi. HEART:  Regular Rhythm; without murmurs/ rubs/ or gallops.  ABDOMEN:  Soft & nontender; normal bowel sounds; no organomegaly or masses detected. EXT: without deformities or arthritic changes; no varicose veins/ venous insuffic/ or edema; +triggers... NEURO:  CN's intact; motor testing normal; sensory testing normal; gait normal & balance OK. DERM:  No lesions noted; no rash etc...    Impression & Recommendations:  Problem # 1:  ABNORMAL CXR>>> She has chronic changes w/ biapical pleural thickening & no real ch serially from old films... she is also asymptomatic w/o cough, phlegm, hemoptysis, congestion, SOB, etc...  Problem # 2:  ACUTE URIS OF UNSPECIFIED SITE (ICD-465.9) This has resolved w/ prev Rx... Her updated medication list for this problem includes:    Zyrtec Allergy 10 Mg Tabs (Cetirizine hcl) .Marland Kitchen... Take 1 tab by mouth once daily in the am...    Mucinex Maximum Strength 1200 Mg Xr12h-tab  (Guaifenesin) .Marland Kitchen... Take 1 tab by mouth two times a day w/ fluids...    Tussionex Pennkinetic Er 10-8 Mg/63ml Lqcr (Hydrocod polst-chlorphen polst) .Marland Kitchen... 1 tsp by mouth two times a day as needed for cough...  Problem # 3:  BRONCHITIS, RECURRENT (ICD-491.9) Doing satis at present...  Problem # 4:  MULT MEDICAL PROBLEMS AS ADDRESSED ABOVE>>> She will ret for full fasting labs and IgE/ Rast test...  Complete Medication List: 1)  Zyrtec Allergy 10 Mg Tabs (Cetirizine hcl) .... Take 1 tab by mouth once daily in the am... 2)  Flonase 50 Mcg/act Susp (Fluticasone propionate) .... 2 sp in each nostril at bedtime.Marland KitchenMarland Kitchen 3)  Mucinex Maximum Strength 1200 Mg Xr12h-tab (Guaifenesin) .... Take 1 tab by mouth two times a day w/ fluids.Marland KitchenMarland Kitchen 4)  Tramadol Hcl 50 Mg Tabs (Tramadol hcl) .... Take 1 tab by mouth every 6-8 h as needed for pain.Marland KitchenMarland Kitchen 5)  Os-cal 500 + D 500-200 Mg-unit Tabs (Calcium carbonate-vitamin d) .Marland Kitchen.. 1 tab two times a day 6)  Multivitamins Tabs (Multiple vitamin) .Marland Kitchen.. 1 tab daily 7)  Zoloft 50 Mg Tabs (Sertraline hcl) .... 1.5 tabs by mouth once daily 8)  Lorazepam 1 Mg Tabs (Lorazepam) .Marland Kitchen.. 1 - 1.5 tabs by mouth at bedtime as needed 9)  Tussionex Pennkinetic Er 10-8 Mg/55ml Lqcr (Hydrocod polst-chlorphen polst) .Marland Kitchen.. 1 tsp by mouth two times a day as needed for cough... 10)  First-dukes Mouthwash Susp (Diphenhyd-hydrocort-nystatin) .Marland Kitchen.. 1 tsp four times a day as needed sore throat, swish/swallow.  Patient Instructions: 1)  Today we updated your med list- see below.... 2)  Please return to our lab one morning this week for your FASTING blood work, and we will include the allergy blood tests (RAST tests)... 3)  Then please call the "phone tree" in a few days for your lab results.Marland KitchenMarland Kitchen  4)  Call for any questions.Marland KitchenMarland Kitchen

## 2010-11-07 NOTE — Progress Notes (Signed)
Summary: lab results  Phone Note Call from Patient Call back at Home Phone [phone]   Caller: Patient Call For: Merriam Brandner Summary of Call: pt waiting for lab results.  Initial call taken by: Tivis Ringer, CNA,  October 29, 2010 1:29 PM  Follow-up for Phone Call        Pls advise lab results.  I have printed them. Thanks! Follow-up by: Vernie Murders,  October 29, 2010 3:35 PM  Additional Follow-up for Phone Call Additional follow up Details #1::        [email or web address] Randell Loop CMA  October 31, 2010 9:52 AM    Spoke with patient-she is aware of normal labs other than allergy rast test showing postive for cat dander.Reynaldo Minium CMA  October 31, 2010 4:14 PM

## 2011-01-11 ENCOUNTER — Telehealth: Payer: Self-pay | Admitting: Pulmonary Disease

## 2011-01-11 NOTE — Telephone Encounter (Signed)
Spoke w/ pt and she c/o of explosive diarrhea since this morning. Pt not sure if she is running fever but pt feels nauseous. Pt has not taking anything for this. Pt was last seen 10/15/10. Will froward to TP to see if she has any recs for pt. Please advise. Thanks.   KNDA   Carver Fila, CMA

## 2011-01-11 NOTE — Telephone Encounter (Signed)
?   abd pain ? Recent abx. ? Blood stools Suspect is viral illness.  Would increase fluids , may drink gatoratde Avoid lactose for 5 days May use immodium x 1 if needed.  Tylenol  As needed  Aches/chills.  Gas x As needed   If not improving will need ov or worsens will need ER Please contact office for sooner follow up if symptoms do not improve or worsen or seek emergency care

## 2011-01-11 NOTE — Telephone Encounter (Signed)
Pt denies any abd pain, recent abx use or blood in stools. She is aware of recs per TP and will seek emergency attention if her symptoms do not improve or get worse.

## 2011-01-23 ENCOUNTER — Other Ambulatory Visit: Payer: Self-pay | Admitting: Pulmonary Disease

## 2011-02-12 NOTE — Assessment & Plan Note (Signed)
[facility]                             PULMONARY OFFICE NOTE   NAME:Decuir, Vivia Ewing                    MRN:          [id number]  DATE:08/03/2007                            DOB:          [birth year]    HISTORY OF PRESENT ILLNESS:  Patient is a 55 year old white female  patient of Dr. Jodelle Green who has a known history of recurrent bronchitis,  mitral valve prolapse, bipolar disorder, and anxiety, who presents today  for an acute office visit.  Patient has recently been hospitalized one  week ago for an acute appendicitis, status post appendectomy.  She was  discharged on July 31, 2007.  The patient presents today complaining  that one day ago she noticed that she was having a pressure sensation  along the left anterior chest wall radiating to the axilla and post-  subscapular region.  The patient denied any increased shortness of  breath, diaphoresis, jaw pain, neck pain, or radiating arm pains.  Patient does complain that she does have some increased palpitations and  irregular heartbeat.  She has been having some difficulty with nausea  with her antibiotic, Augmentin, that she was discharged on.  Patient  does have a history with some mitral valve prolapse with previous  difficulties with recurrent palpitations; however, it on no regular  maintenance medications.  A previous 2D echo in 1986 showed mild MVP.  Previous EKGs have showed poor R wave progression in V1 and V3.  Patient  reports she has no known cardiac disease in her family.  Is a previous  smoker who quit 15 years ago.  Patient does have a history of A fib in  her father and mother.   PAST MEDICAL HISTORY:  1. Recurrent bronchitis.  2. Allergic rhinitis.  3. Mitral valve prolapse.  4. Hepatitis C.  5. Irritable bowel syndrome.  6. Family history of colon cancer.  7. Herpes simplex virus.  8. Paresthesias.  9. Bipolar disorder.  10.Anxiety.  11.History of skin cancer.   CURRENT  MEDICATIONS:  Multivitamins daily.  Currently on Percocet and  Augmentin, status post hospital discharge four days ago.   PHYSICAL EXAMINATION:  Patient is a pleasant female in no acute  distress.  She is afebrile with stable vital signs.  O2 saturation is 97% on room  air.  HEENT:  Unremarkable.  NECK:  Supple without cervical adenopathy.  No JVD.  Carotids are equal  with positive upstrokes bilaterally without any bruits or palpable  thyromegaly.  LUNGS:  The lung sounds are clear to auscultation bilaterally without  any wheezing or crackles.  CARDIAC:  With notable ectopic beats.  No murmur, rub or gallop is  noted.  ABDOMEN:  Soft.  Patient has a clean dressing along the right lower  quadrant with some notable tenderness due to recent surgery.  No  erythema is noted.  No bruits are noted.  Negative CVA tenderness.  EXTREMITIES:  Warm without any calf cyanosis, clubbing, or edema.  Negative Homans' sign.   DATA:  EKG reveals a sinus rhythm with several supraventricular ectopic  beats.   IMPRESSION/PLAN:  Atypical  chest pain with irregular EKG.  Patient has  notable irregularity on her EKG with notable pauses with  supraventricular ectopic beats versus possible second-degree block.  Patient without any significant ischemic node on EKG.  Patient will be  referred to cardiology for further evaluation.   OPTIONS:  She has been made an appointment with Dr. Jens Som tomorrow  morning, on August 04, 2007.  Patient is advised if symptoms worsen or  she has further increased palpitations, she is to contact our office for  immediate followup or seek emergency room attention.      Rubye Oaks, NP  Electronically Signed      Lonzo Cloud. Kriste Basque, MD  Electronically Signed   TP/MedQ  DD: 08/03/2007  DT: 08/04/2007  Job #: [id number]

## 2011-02-12 NOTE — H&P (Signed)
[name], [name]             ACCOUNT NO.:  [id number]   MEDICAL RECORD NO.:  [id number]          PATIENT TYPE:  INP   LOCATION:  0098                         FACILITY:  [facility]   PHYSICIAN:  Sharlet Salina T. Hoxworth, M.D.DATE OF BIRTH:  [birth year]   DATE OF ADMISSION:  07/28/2007  DATE OF DISCHARGE:                              HISTORY & PHYSICAL   CHIEF COMPLAINTS:  Abdominal pain.   HISTORY OF PRESENT ILLNESS:  Monique [name] is a 55 year old female who  about 10 days ago had the gradual onset of mid abdominal pain.  She  states this pain has been waxing and waning over the last 10 days, is  worse with motion.  She had some decreased appetite but no nausea or  vomiting.  Over the last 48 hours, however, the pain has intensified,  moved to the right lower quadrant, and has become more constant.  She  has had poor appetite and has felt somewhat chilled and warm today.  She  was seen by Dr. Kriste Basque in the office today, and abdominal CT scan was  obtained showing a very complex appendicitis as described below.  She  has had no chronic GI complaints or similar symptoms prior to this.  Bowel movements have been okay.  No vomiting.  No GU symptoms.   PAST MEDICAL HISTORY:  Generally healthy.  She has had a bilateral tubal  ligation.  No other significant medical or surgical problems.   MEDICATIONS:  Lorazepam for occasional anxiety.   ALLERGIES:  None.   SOCIAL HISTORY:  She is married.  Does not smoke cigarettes.  Drinks  alcohol rarely.   REVIEW OF SYSTEMS:  Generally healthy, all negative.   PHYSICAL EXAMINATION:  VITAL SIGNS:  Temperature 97.8, pulse 88,  respirations 18, blood pressure 111/50.  GENERAL:  A thin, healthy-appearing white female in no acute distress.  SKIN:  Warm, dry.  No rash or infection.  HEENT/NECK:  No palpable mass or thyromegaly.  Sclerae nonicteric.  LUNGS:  Clear without wheezing or increased work of breathing.  CARDIAC:  Regular rate and rhythm.  No  murmurs.  ABDOMEN:  Nondistended.  Bowel sounds are active.  There is fairly  marked right lower quadrant tenderness with some guarding.  I cannot  discern any definite masses.  No organomegaly.  EXTREMITIES:  No joint swelling to deformity.  NEUROLOGIC:  Alert, oriented.  Motor and sensory exams grossly normal.   LABORATORY:  Significant for white count of 12.7, hemoglobin 12.  Potassium 3.2. Remainder of LFTs, electrolytes normal.   CT scan of the abdomen and pelvis is reviewed.  This shows a fairly  extensive inflammatory process adjacent to the cecum surrounding the  appendix with a fluid collection 2-3 cm adjacent to the appendix  consistent with a walled-off perforation.   ASSESSMENT/PLAN:  Apparent perforated appendicitis with periappendiceal  abscess.  The patient is being started on broad-spectrum antibiotics.  I  have recommended proceeding with appendectomy.  I have discussed  approaching this initially laparoscopically in case it can be safely  dissected, but she may well require open procedure.  Lorne Skeens. Hoxworth, M.D.  Electronically Signed     BTH/MEDQ  D:  07/28/2007  T:  07/28/2007  Job:  [id number]

## 2011-02-12 NOTE — Assessment & Plan Note (Signed)
[facility]                            CARDIOLOGY OFFICE NOTE   [name], Monique [name]                    MRN:          [id number]  DATE:08/04/2007                            DOB:          05-07-1956    Monique [name] is a very pleasant 55 year old female with a past medical  history of mitral valve prolapse, fibromyalgia, anxiety, irritable bowel  syndrome with palpitations. She apparently has been diagnosed with  mitral valve prolapse in the past by Dr. Kriste [name].  I do not have  specifics.  She has had occasional palpitations in the past but these  appear to be self-limiting.  She underwent emergent appendectomy last  week.  She was in the hospital for 3 days.  Since she was discharged she  has had worsening palpitations.  They are described as a skip and a  racing.  There is associated shortness of breath and some discomfort in  the left axillary area.  She had symptoms for a good part of the night  last evening.  Because of the above, we were asked to further evaluate.  Note, she typically does not have dyspnea on exertion, orthopnea, PND,  pedal edema, exertional chest pain or syncope.   MEDICATIONS AT PRESENT:  1. Amox TR-K 875/125 daily.  2. Nasacort.  3. Calcium.  4. Vitamin D.  5. Fish Oil.   She has no known drug allergies.   SOCIAL HISTORY:  1. She does not smoke.  2. She rarely consumes alcohol.   FAMILY HISTORY:  Positive for atrial fibrillation but no coronary  disease.   PAST MEDICAL HISTORY:  1. There is no diabetes mellitus, hypertension or hyperlipidemia.  2. She does have a history of anxiety.  3. There is a history of fibromyalgia and irritable bowel syndrome.  4. She has had prior nasal surgery, recent appendectomy and tubal      ligation.  5. There is a history of irritable bowel syndrome.  6. There is a history of mitral valve prolapse.  7. There is a history of Hepatitis C.  Some of this is going from the previous  chart.   REVIEW OF SYSTEMS:  She denies any headaches or fevers or chills.  There  is no productive cough or hemoptysis.  There is no dysphagia,  odynophagia, melena or hematochezia.  There is no dysuria or hematuria.  There is no rash or seizure activity.  There is no orthopnea, PND or  pedal edema.  She does have some residual discomfort from a recent  appendectomy.  The remaining systems are negative.   PHYSICAL EXAMINATION TODAY:  Shows a blood pressure of 96/60 and her  pulse is 112.  She weighs 122 pounds.  She is well-developed and anxious  appearing.  She does not appear to be in any acute distress at present.  Skin is warm and dry.  There is no peripheral clubbing.  Her back is  normal.  HEENT:  Normal with normal eyelids.  NECK:  Supple with a normal upstroke bilaterally, no bruits noted.  There is no jugular venous distention and no thyromegaly noted.  She  does have a scar in the upper chest area from a previous basal cell  removal.  CHEST:  Clear to auscultation, normal expansion.  CARDIOVASCULAR EXAM:  A tachycardic rate and an irregular rhythm.  There  are no murmurs, rubs or gallops noted.  I cannot appreciate a click.  The PMI is nondisplaced.  ABDOMINAL EXAM:  She is status post recent appendectomy and has a  dressing in place.  There is minimal tenderness with palpation.  There  is no rebound or guarding.  I cannot palpate masses or  hepatosplenomegaly.  She has 2+ femoral pulses bilaterally, no bruits.  EXTREMITIES:  No edema that I could palpate, no cords.  She has 2+  posterior tibial pulses bilaterally.  NEUROLOGIC EXAM:  Grossly intact.   Her electrocardiogram shows a sinus rhythm with frequent runs of what  appears to be PAT vs. atrial fibrillation.  There are no significant ST  changes noted.   DIAGNOSES:  1. Palpitations - She has increased ectopy on her electrocardiogram      with short runs of paroxysmal atrial tachycardia or paroxysmal      atrial  fibrillation.  I think this is most likely related to the      hyperadrenergic state associated with a recent ruptured appendix      and appendectomy.  We will add Toprol 25 milligrams by mouth daily.      We will need to follow her blood pressure closely as it is      borderline at baseline.  I will also check a BMET and a TSH.  We      will check an echocardiogram to quantify her left ventricular      function and to evaluate her mitral valve.  This should improve as      she gets further away from surgery.  I have also asked her to take      enteric coated aspirin 325 milligrams by mouth daily.  Note, she      has no embolic risk factors including no diabetes mellitus,      hypertension, prior cerebrovascular accident and she is 55 years      old.  Her CHADS2 score is 0.  2. Anxiety - She requested a prescription for Xanax today as these      palpitations are causing a significant amount of stress.  I gave      her Xanax 0.25 milligrams by mouth daily as needed and a total of      15 with no refills.  I have asked her to follow up with Dr. Kriste [name]      concerning this issue.  3. History of mitral valve prolapse - We will check an echocardiogram.  4. Fibromyalgia - Per Dr. Kriste [name].  5. Irritable bowel syndrome - Per Dr. Kriste [name].   I will see her back in 4 weeks.     Madolyn Frieze Jens Som, MD, [facility]  Electronically Signed    BSC/MedQ  DD: 08/04/2007  DT: 08/04/2007  Job #: [id number]

## 2011-02-12 NOTE — Discharge Summary (Signed)
NAMEJERIE, Monique [name]             ACCOUNT NO.:  [id number]   MEDICAL RECORD NO.:  [id number]          PATIENT TYPE:  INP   LOCATION:  1509                         FACILITY:  [facility]   PHYSICIAN:  Sharlet Salina T. Hoxworth, M.D.DATE OF BIRTH:  1955-12-16   DATE OF ADMISSION:  07/28/2007  DATE OF DISCHARGE:  07/31/2007                               DISCHARGE SUMMARY   DISCHARGE DIAGNOSIS:  Perforated appendicitis with periappendiceal  abscess.   SURGICAL PROCEDURES:  Laparoscopy and open appendectomy on July 28, 2007.   HISTORY OF PRESENT ILLNESS:  Monique [name] is a 55 -year-old female who  had the gradual onset of mid abdominal pain about 10 days prior to her  presentation to the emergency room.  This has been waxing and waning,  worse with motion.  She has had decreased appetite, but no nausea or  vomiting.  Over the past 48 hours, the pain has intensified and moved to  the right lower quadrant and become more constant.  She was seen by Dr.  Kriste Basque on the day of admission and abdominal CT scan was obtained,  showing a complex appendicitis, as described below.   PAST MEDICAL HISTORY:  Generally healthy.  She has had bilateral tubal  ligation, no other significant medical or surgical problems.   MEDICATIONS:  Lorazepam p.r.n.   ALLERGIES:  None.   SOCIAL HISTORY FAMILY HISTORY AND REVIEW OF SYSTEMS:  See detailed H&P.   PERTINENT PHYSICAL EXAM:  VITAL SIGNS:  Afebrile, vital signs within  normal limits.  GENERAL:  A thin, healthy-appearing white female in no acute distress.  Abdominal exam showed fairly marked right lower quadrant tenderness with  some guarding and no palpable masses.   LABORATORY DATA:  White count elevated at 12.7, potassium 3.2, otherwise  unremarkable.   CT scan of the abdomen and pelvis shows a fairly extensive inflammatory  process adjacent to the cecum surrounding the appendix including a 2- to  3-cm adjacent fluid collection consistent with abscess of  walled-off  perforation.   HOSPITAL COURSE:  The patient was admitted with an impression of  perforated appendicitis and with a worsening clinical picture, I  recommended proceeding with surgery.  On the day of admission, she  underwent laparoscopy and then due to extensive inflammation and para-  appendiceal abscess, was converted to open procedure and underwent  appendectomy and drainage of her abscess for perforated appendicitis.  She was maintained on perioperative Unasyn.  She had a low-grade fever  the first postoperative day.  The second postop day, white count was  decreased to 8000.  She was having less pain and less tenderness and  diet was advanced.  By the third postoperative day, October 31, she was  afebrile.  The abdomen was soft and nontender.  She had a bowel  movement.  Her wound was left open and home health will do moist saline daily  dressing changes.  She was given a prescription for Augmentin for 1 week  and the time of discharge.  Follow-up will be in my office.  Final  pathology revealed acute appendicitis.  Lorne Skeens. Hoxworth, M.D.  Electronically Signed     BTH/MEDQ  D:  08/17/2007  T:  08/18/2007  Job:  [id number]   cc:   Lonzo Cloud. Kriste Basque, MD  [address]  [place]  [place] [address]

## 2011-02-12 NOTE — Op Note (Signed)
NAMERUTHA, MELGOZA             ACCOUNT NO.:  [id number]   MEDICAL RECORD NO.:  [id number]          PATIENT TYPE:  INP   LOCATION:  0098                         FACILITY:  [facility]   PHYSICIAN:  Sharlet Salina T. Hoxworth, M.D.DATE OF BIRTH:  December 10, 1955   DATE OF PROCEDURE:  07/28/2007  DATE OF DISCHARGE:                               OPERATIVE REPORT   PREOPERATIVE DIAGNOSIS:  Appendicitis with perforation and abscess.   POSTOPERATIVE DIAGNOSIS:  Appendicitis with perforation and abscess.   PROCEDURE:  Laparoscopy, open appendectomy.   SURGEON:  Lorne Skeens. Hoxworth, M.D.   ANESTHESIA:  General.   BRIEF HISTORY:  Elverta Dimiceli is a 55 year old female who, about ten  days ago, developed mid abdominal pain.  This has been persistent and  over the last 2-3 days has moved to the right lower quadrant and  worsened.  On evaluation today, she has significant tenderness in the  right lower quadrant, elevated white count, and CT scan of the abdomen  and pelvis has been obtained which shows an apparent complicated  appendicitis with a periappendiceal abscess.  I have recommended  proceeding with laparoscopic and possible open appendectomy.  The  procedure, indications, risks of bleeding, infection and increased need  for open procedure due to apparent perforation have been discussed and  understood.  She is now brought to the operating room for this  procedure.   DESCRIPTION OF OPERATION:  The patient was brought to the operating room  and placed in a supine position on the operating table and general  orotracheal anesthesia was induced.  The abdomen was widely sterilely  prepped and draped.  The correct patient and procedure were verified.  She had received preoperative broad spectrum antibiotics.  Abdominal  access was obtained with a 1 cm open Hassan technique at the umbilicus  through a mattress sutures of 0 Vicryl and pneumoperitoneum established.  Under direct vision, a 5 mm trocar  was placed in the right upper  quadrant and a 12 mm trocar in the left lower quadrant.   There was an obvious inflammatory process around the cecum.  The omentum  was adherent.  The cecum and terminal ileum were identified.  Some  inflammatory omental adhesions were bluntly dissected away from the  cecum.  The appendix was found lying anteriorly and down against the  terminal ileum and was severely inflamed.  There was fairly dense  inflammatory reaction.  The appendix was gently teased away partially  from the terminal ileum and a frank abscess entered with thick purulent  material.  This was cultured and completely suctioned and evacuated.  There was no general peritoneal contamination.  As the dissection  progressed, the appendix was very densely adherent to the terminal ileum  and there was very firm severe inflammation extending down near the base  of the appendix.  It was very difficult to safely mobilize the appendix  and, at this point, I felt that we needed to convert to an open  procedure.   Therefore, trocars were removed and the mattress sutures was secured at  the umbilicus.  An oblique incision was made in  the right lower quadrant  and dissection carried down through the subcutaneous tissue.  The  external oblique was divided along the line of its fibers.  The internal  oblique was bluntly divided along the lines of its fibers and a small  extension made medially into the rectus muscle with cautery.  The  peritoneum was entered under direct vision.  There was a good sized  palpable inflammatory mass around the appendix.  The cecum was mobilized  slightly dividing its lateral peritoneal attachments.  This enabled the  entire cecum and terminal ileum to be delivered up through the incision.  The omentum was densely adherent to the entire process and severely  inflamed and I came across the omentum away from the appendix with  clamps and tied this with 3-0 Vicryl completely  taking the omentum off  of the area in question.  The appendix was densely adherent along the  terminal ileum with a perforation at its mid point.  However, with the  specimen up in my hands, I was able to tease the appendix away from the  terminal ileum down to the mesoappendix which was then divided between  clamps and tied with 3-0 Vicryl ties.  The inflammation extended down to  the base of the appendix, but the tip of the cecum was relatively  uninflamed.  The mesoappendix was further mobilized and the appendix  completely mobilized down to the tip of the cecum and, at this point,  the specimen was divided from the tip of the cecum with a single firing  of the TX60 mm blue load stapler.  Again, the process did not extend  down onto the wall of the cecum.  The staple line was intact without  bleeding.   The viscera was returned to their anatomic position and the right lower  quadrant and pelvis thoroughly irrigated with saline.  The wound was  then closed in layers closing the peritoneum, transversus, and internal  oblique with a running 2-0 PDS and the external oblique with a running 0  PDS.  Due to purulence and contamination of the wound, the subcu was  left open and packed with moist saline gauze.  The small laparoscopic  incisions were closed with Monocryl and Dermabond.  A dry, sterile  dressing was applied.  The patient was taken to recovery in good  condition.      Lorne Skeens. Hoxworth, M.D.  Electronically Signed     BTH/MEDQ  D:  07/28/2007  T:  07/28/2007  Job:  [id number]

## 2011-02-12 NOTE — Letter (Signed)
June 19, 2007    C. Lesia Sago, M.D.  [address]  [address]  [place], [place] [address]   RE:  ALOMA, BOCH  MRN:  [id number]  /  DOB:  1955/11/26   Dear Mellody Dance:   Ms. Grizel Vesely is a 55 year old patient of mine whom I follow for  general medical purposes.  She has a history of recurrent bronchitis,  mitral valve prolapse, and previous hepatitis C treated with Interferon  in 2003.  She previously saw Dr. Fransisca Connors around that time for  paresthesias and nothing specific was found.   Jannifer presented today with great concern for recent memory problems.  She became somewhat tearful as she worried about not being able to find  her way home one evening while driving.  She states her family has  noticed that she is forgetful as well.  She has had quite a bit of  trouble with calculations.  She was previously employed at [employer] but was laid off with the collapse of the company in 2004.  Most recently she has been working and caring for a sick relative.   I performed a mini mental status exam and found her to be generally well  oriented and the only problems she had included serial 7's (she could  not subtract 100-7), but she was able to spell world backwards.  She  remembered 3 objects immediately but only 2 of the 3 objects after  distraction.   In view of the fact that she is 55 years old and quite concerned for her  recent memory problems, I recommended a thorough neurologic evaluation  and as always I appreciate your agreeing to see her.  Enclosed you will  find her complete problem list, current medications, and results of  recent chest x-ray and lab work for your records.  I look forward to  hearing your comments.    Sincerely,      Lonzo Cloud. Kriste Basque, MD  Electronically Signed    SMN/MedQ  DD: 06/19/2007  DT: 06/19/2007  Job #: [id number]

## 2011-02-12 NOTE — Assessment & Plan Note (Signed)
[facility]                            CARDIOLOGY OFFICE NOTE   NAME:[name], Monique Ewing                    MRN:          [id number]  DATE:09/03/2007                            DOB:          Mar 15, 1956    Ms. Monique [name] is a 55 year old that I recently saw on August 04, 2007,  secondary to palpitations.  She has recently had an appendectomy.  Following that episode, she had worsening palpitations.  We had an  electrocardiogram at that time that showed frequent runs of what  appeared to PAT versus atrial fibrillation.  At that time, we scheduled  her to have an echocardiogram which was performed on August 10, 2007.  Her LV function was normal.  There was mild mitral regurgitation.  She  also had a TSH which was normal at 2.69 and her potassium was 4.8.  We  added Toprol which she took for 2 weeks and her symptoms dramatically  improved.  Since that time she occasionally feels her heart skip but  there are no sustained arrhythmias.   PRESENT MEDICATIONS:  Nasacort, calcium, vitamin D, fish oil, Seroquel.   PHYSICAL EXAMINATION:  VITAL SIGNS:  Show a blood pressure 115/60 and  her pulse is 77.  She weighs 118 pounds.  HEENT:  Normal.  NECK:  Supple.  CHEST:  Clear  CARDIOVASCULAR:  Reveals a regular rate and rhythm.  ABDOMEN:  Shows no tenderness.  She is status post recent appendectomy.  EXTREMITIES:  Show no edema.   DIAGNOSES:  1. Recent palpitations secondary to short runs of paroxysmal atrial      tachycardia or paroxysmal atrial fibrillation.  This has completely      resolved and I think was most likely related to the hyperandrogenic      state associated with her recent surgery for a ruptured appendix.      She has discontinued her Toprol and her palpitations have not      worsened.  She will therefore continue off this, but I have      explained that she could take this in the future as needed.  2. History of anxiety.  She will follow up  with Dr. Kriste [name] concerning      this issue.  3. History of mitral valve prolapse.  This was not evident on a recent      echocardiogram.  4. Fibromyalgia.  5. Irritable bowel syndrome.   I will see her back on an as needed basis.     Madolyn Frieze Monique Som, MD, [facility]  Electronically Signed    BSC/MedQ  DD: 09/03/2007  DT: 09/03/2007  Job #: [id number]

## 2011-02-15 NOTE — Op Note (Signed)
[place]. [facility]  Patient:    Monique [name], Monique [name]                      MRN: [id number] Proc. Date: 01/12/01 Adm. Date:  [id number] Attending:  Melody Comas.                           Operative Report  PREOPERATIVE DIAGNOSIS: 1. A 1.5 cm basal cell carcinoma of the presternal area. 2. A 1.0 cm basal cell carcinoma of the suprasternal notch area. 3. A 1.0 cm basal cell carcinoma of the forehead.  POSTOPERATIVE DIAGNOSIS: 1. A 1.5 cm basal cell carcinoma of the presternal area. 2. A 1.0 cm basal cell carcinoma of the suprasternal notch area. 3. A 1.0 cm basal cell carcinoma of the forehead.  OPERATION PERFORMED: 1. Excisional biopsy of 1.5 cm basal cell carcinoma of the presternal area. 2. Excisional biopsy of 1.0 cm basal cell carcinoma of the suprasternal notch    area. 3. Excisional biopsy of 1.0 cm basal cell carcinoma of the forehead.  SURGEON:  Janet Berlin. Dan Humphreys, M.D.  ANESTHESIA:  Local.  ASSISTANT:  None.  INDICATIONS FOR PROCEDURE:  The patient is a 55 year old woman who has biopsy proven basal cell carcinomas at the presternal, suprasternal notch and forehead areas.  She is taken to the operating room at this time for excisional biopsy.  DESCRIPTION OF PROCEDURE:  The patient was brought to the minor surgery operating suite at the [facility].  The areas to be excised were exposed, sterilely prepped and sterilely draped.  I designed elliptically shaped excisions around each of these areas with grossly clear tissue margins.  Each of the areas was then infiltrated with 1% Xylocaine containing epinephrine.  I first addressed the forehead area.  This was excised in an elliptically shaped excision oriented transversely across the forehead, parallel with the lines of relaxed skin tension.  This was excised full thickness.  The specimen was passed off the operative field for permanent sections.  After obtaining hemostasis, the  wound was closed in layers. Interrupted, buried dermal sutures of 5-0 Vicryl were placed followed by a simple running suture of 6-0 Monofilament nylon as well as two sutures of 6-0 Monofilament nylon placed in vertical mattress fashion for wound edge eversion.  This area was dressed with bacitracin ointment, a small strip of Xeroform gauze and a sterile band-aid.  Attention was next directed to the suprasternal notch area.  This lesion was also excised elliptically with the long axis of the ellipse oriented transversely.  As before, the specimen was passed off the operative field for permanent sections.  The resulting defect was closed in layers.  Interrupted, buried dermal sutures of 4-0 Vicryl were placed followed by a running 5-0 monofilament nylon suture.  This area was also sterilely dressed with bacitracin ointment, Xeroform gauze, and surgical gauze immobilized into position with tape.  Finally, attention was directed to the presternal lesions.  This was roughly oriented in a transverse direction.  The skin also appeared to be more lax with the excision oriented transversely.  After excising the lesion full thickness, the specimen was passed off the operative field as before.  The resulting defect was closed with interrupted, buried dermal sutures of 4-0 Vicryl followed by a running 5-0 nylon suture for final epidermal leveling. This area was dressed in identical fashion to the suprasternal notch area.  The patient  was given wound care instructions and will see me in the office later this week for a wound check, possible suture removal, and review of her final pathology report. DD:  01/13/01 TD:  01/13/01 Job: [id number]

## 2011-02-15 NOTE — Op Note (Signed)
[place]. [facility]  Patient:    Monique [name], Monique [name]                      MRN: [id number] Proc. Date: 03/05/01 Adm. Date:  [id number] Disc. Date: [id number] Attending:  Melody Comas.                           Operative Report  PREOPERATIVE DIAGNOSIS:  Status post excision of presternal basal cell carcinoma with positive margins.  POSTOPERATIVE DIAGNOSIS:  Status post excision of presternal basal cell carcinoma with positive margins.  PROCEDURE:  Re-excision of 5.0 cm sternal scar for positive margins of resection.  SURGEON:  Janet Berlin. Dan Humphreys, M.D.  ANESTHESIA:  Local.  INDICATIONS:  [name] is a 55 year old woman who is about 1-1/2 months out from excision of several areas of basal cell carcinoma.  She had a particularly large basal cell carcinoma in the presternal area, which was noted on pathologic examination to have probable involvement of the margins of resection.  She is returned to the operating room at this time for a re-excision of the scar with a band of adjacent skin on either side in order to attempt to establish clear margins of resection and thereby to prevent local recurrence.  DESCRIPTION OF PROCEDURE:  The patient was brought to the minor surgery operating room in the [facility] and placed on the table in the supine position.  I diagrammed out the limits of resection to include the entirety of the existing scar as well as a zone of skin in the immediate vicinity of the scar measuring approximately 5 mm wide centrally.  The area was then infiltrated with 1% Xylocaine containing epinephrine.  I then excised the specimen full-thickness, removing approximately 5 cm in length strip of skin and scar.  Hemostasis was satisfactory.  The wound edges were reapproximated with interrupted, buried dermal sutures of 4-0 Vicryl.  A running monofilament 5-0 nylon suture was used for final epidermal leveling. The wound was  sterilely dressed with bacitracin ointment, Xeroform gauze, and dry surgical gauze, and mobilized in position with tape.  The specimen was passed off the operative field for permanent sections.  The patient was given wound care instructions and will see me back in the office in approximately one week for wound check, probable suture removal, and review of her final pathology report. DD:  03/05/01 TD:  03/06/01 Job: [id number]

## 2011-07-04 ENCOUNTER — Ambulatory Visit (INDEPENDENT_AMBULATORY_CARE_PROVIDER_SITE_OTHER): Payer: Self-pay

## 2011-07-04 DIAGNOSIS — Z23 Encounter for immunization: Secondary | ICD-10-CM

## 2011-07-05 ENCOUNTER — Ambulatory Visit: Payer: Self-pay

## 2011-07-10 LAB — CBC
HCT: 31.1 — ABNORMAL LOW
Hemoglobin: 10.7 — ABNORMAL LOW
Hemoglobin: 12
MCHC: 33.7
MCHC: 34.3
MCV: 89.4
MCV: 89.6
RBC: 3.97
RDW: 13.5

## 2011-07-10 LAB — BASIC METABOLIC PANEL
CO2: 30
Chloride: 106
Glucose, Bld: 114 — ABNORMAL HIGH
Potassium: 4.8
Sodium: 137

## 2011-07-10 LAB — URINALYSIS, ROUTINE W REFLEX MICROSCOPIC
Glucose, UA: NEGATIVE
Leukocytes, UA: NEGATIVE
Nitrite: NEGATIVE
Protein, ur: NEGATIVE
pH: 5.5

## 2011-07-10 LAB — CULTURE, ROUTINE-ABSCESS

## 2011-07-10 LAB — DIFFERENTIAL
Lymphocytes Relative: 6 — ABNORMAL LOW
Lymphs Abs: 0.8
Neutro Abs: 11.4 — ABNORMAL HIGH
Neutrophils Relative %: 90 — ABNORMAL HIGH

## 2011-07-10 LAB — COMPREHENSIVE METABOLIC PANEL
BUN: 6
CO2: 27
Calcium: 8.4
Creatinine, Ser: 0.64
GFR calc Af Amer: >60
GFR calc non Af Amer: >60
Glucose, Bld: 94

## 2011-07-22 ENCOUNTER — Other Ambulatory Visit: Payer: Self-pay | Admitting: Pulmonary Disease

## 2011-10-31 ENCOUNTER — Other Ambulatory Visit (INDEPENDENT_AMBULATORY_CARE_PROVIDER_SITE_OTHER): Payer: Federal, State, Local not specified - PPO

## 2011-10-31 ENCOUNTER — Encounter: Payer: Self-pay | Admitting: *Deleted

## 2011-10-31 ENCOUNTER — Ambulatory Visit (INDEPENDENT_AMBULATORY_CARE_PROVIDER_SITE_OTHER): Payer: Federal, State, Local not specified - PPO | Admitting: Adult Health

## 2011-10-31 VITALS — BP 118/72 | HR 70 | Temp 97.0°F | Ht 67.0 in | Wt 133.2 lb

## 2011-10-31 DIAGNOSIS — J029 Acute pharyngitis, unspecified: Secondary | ICD-10-CM

## 2011-10-31 DIAGNOSIS — J069 Acute upper respiratory infection, unspecified: Secondary | ICD-10-CM

## 2011-10-31 MED ORDER — FLUTICASONE PROPIONATE 50 MCG/ACT NA SUSP: 1.0000 | Freq: Every day | NASAL | Status: DC

## 2011-10-31 NOTE — Patient Instructions (Signed)
Salt water gargles As needed   Zyrtec 10mg  At bedtime  As needed  Drainage.  Tylenol As needed   Fluids and rest  Mucinex DM Twice daily  As needed  Cough/congestion  Please contact office for sooner follow up if symptoms do not improve or worsen or seek emergency care  Follow up Dr. Kriste Basque  In 2 months for physical.

## 2011-10-31 NOTE — Assessment & Plan Note (Signed)
URI  Check strep test   Plan:  Salt water gargles As needed   Zyrtec 10mg  At bedtime  As needed  Drainage.  Tylenol As needed   Fluids and rest  Mucinex DM Twice daily  As needed  Cough/congestion  Please contact office for sooner follow up if symptoms do not improve or worsen or seek emergency care  Follow up Dr. Kriste Basque  In 2 months for physical.

## 2011-10-31 NOTE — Progress Notes (Signed)
Subjective:    Patient ID: Monique [name], female    DOB: 09-15-1956, 56 y.o.   MRN: [id number]  HPI 56 y/o WF    ~ July 02, 2010: Yearly ROV & add-on for URI symptoms> started w/sinus pressure, head & chest congestion, dry hacking cough, ?fever & chills, +swollen glands, and chest tightness x several days... taking OTC meds w/o relief & we discussed checking CXR (chr changes, NAD), & Rx w/ Depo/ dosepak/ Augmentin/ Tussionex... otherw notes stable> hx MVP- no CP, rare palpit, denies SOB etc... GI stable and she had her f/u colonoscopy 9/09> neg, no polyps & f/u 33yrs... she still sees [name] on Zoloft & Lorazepam...   ~ October 15, 2010: she went to [facility] 10/02/10 w/ URI "I couldn't talk" & had CXR showing biapical pleuroparenchymal scarring & rather than checking old films in the [facility] sys they scared the bejesus out of her thinking she had pneumonia, lung ca, or worse... we did CXR 10/11 w/ no ch in this area over a 31yr period & she is reassured... incidently- no CP, cough, sputum, f/ c/ s, etc.   10/31/2011 Acute OV  Pt presents for an acute office visit. Complains of 2 days of sore throat ,productive cough, wheezing, fevers at night.  No fever or chest pain. No recent abx or travel. OTC not helping.  Sore throat is worst symptom.  No edema.     PMH:  RHINITIS, ALLERGIC NOS (ICD-477.9) - uses FLONASE & ZYRTEK as needed... she notes sinus & allergy problems on & off... she wonders about "allergies" & we discussed checking IgE & Rast > pending.  BRONCHITIS, RECURRENT (ICD-491.9) - exsmoker quit 1990's (20 yrs of up to 1.5ppd prior to that), uses Mucinex as needed  ~ CXR 10/11 showed sl hyperinflation, mild incr markings & apical scarring, NAD...  ~ 1/12: CXR report from [facility] sounds similar to our prev films (not avail for review) see above.  MITRAL VALVE PROLAPSE (ICD-424.0) - she has MSC on exam... she notes rare palpit (self limited), & Echo 1986 w/ mild MVP.  ~ 2DEcho 11/08 showed norm  LV w/ EF= 55-65% & no regional wall motion abn, mild thickening of MV w/ mild MR...  ABNORMAL ELECTROCARDIOGRAM (ICD-794.31) - poor R wave progression V1-V3, no known CAD  Hx of HEPATITIS C (ICD-070.51) - Hx IV drug abuse as teen; Abn LFT's in late 80's/ early 90's; liver Bx 93 w/ chr persist hepatitis w/o necrosis (DrPatterson); serology pos for HepB antibodies, neg HBSAg, pos HepCAb; had pos HepC PCR 2002 (genotype 2B) & Rx w/ interferon 9/02-3/03 ([name]) w/ complete disappearance of HepC viral RNA; all subseq tests have shown - nl LFT's, pos HepCAb, & neg HepC RNA Quant PCR.  ~ labs 11/10 showed normal LFTs  IRRITABLE BOWEL SYNDROME (ICD-564.1)  Family Hx of CANCER, COLORECTAL (ICD-154.0) - Mother w/ colon Ca age 12 (resected, no known recur); pt had normal colonoscopy 4/02, and f/u colonoscopy 9/09 was also neg- no polyps w/ f/u sched 57yrs...  HSV (ICD-054.9) - lab eval 5/08 by gyn [name])  SYMPTOM, MEMORY LOSS (ICD-780.93) - new complaint 9/08 w/ essent nl MMSE, refer to neuro fo rfurther testing per pt request... seen by [name] who felt her memory disturbance, fatigue, & pain were c/w Fibromyalgia> EEG was normal, MRI brain was normal, Sleep Study was relatively unremarkable, and Labs were neg including Lyme titer... Rx'd w/ Seroquel 50mg  Qhs, tried Provigil10mg /d, w/ consideration for use of Cymbalta or Adderall depending on response.  Hx  of PARESTHESIA (ICD-782.0) - eval by neuro ([name]) nothing found... she uses TRAMADOL 50mg  Prn.  DISORDER, BIPOLAR NOS (ICD-296.80) - Rx by [name] on Lamictal prev- off now & taking ZOLOFT 100mg /d & LORAZEPAM 0.5mg - taking 1.5 tabs at bedtime...  ANXIETY (ICD-300.00) - takes Lorazepam as noted...  SKIN CANCER, HX OF (ICD-V10.83) - several basal cell carcinomas removed in past.  Health Maintenance:  ~ GI: followed by [name]> colonoscopy 9/09 was neg- no polyps seen, f/u 57yrs.  ~ GYN: followed by [name] for PAP, Mammograms, BMD (?when done  last & ?results)- Rx Calcium, MVI, Vit D).  ~ Immunizations: she declines the yearly seasonal Flu vaccine...     Review of Systems Constitutional:   No  weight loss, night sweats,  Fevers, chills, fatigue, or  lassitude.  HEENT:   No headaches,  Difficulty swallowing,  Tooth/dental problems, or  + Sore throat,                No sneezing, itching, ear ache,  +nasal congestion, post nasal drip,   CV:  No chest pain,  Orthopnea, PND, swelling in lower extremities, anasarca, dizziness, palpitations, syncope.   GI  No heartburn, indigestion, abdominal pain, nausea, vomiting, diarrhea, change in bowel habits, loss of appetite, bloody stools.   Resp:    No coughing up of blood.  No change in color of mucus.    No chest wall deformity  Skin: no rash or lesions.  GU: no dysuria, change in color of urine, no urgency or frequency.  No flank pain, no hematuria   MS:  No joint pain or swelling.  No decreased range of motion.  No back pain.  Psych:  No change in mood or affect. No depression or anxiety.  No memory loss.         Objective:   Physical Exam GEN: A/Ox3; pleasant , NAD, well nourished   HEENT:  [place]/AT,  EACs-clear, TMs-wnl, NOSE-clear, THROAT-mild post pharynx redness ,  no lesions, no  exudate noted.   NECK:  Supple w/ fair ROM; no JVD; normal carotid impulses w/o bruits; no thyromegaly or nodules palpated; no lymphadenopathy.  RESP  Clear  P & A; w/o, wheezes/ rales/ or rhonchi.no accessory muscle use, no dullness to percussion  CARD:  RRR, no m/r/g  , no peripheral edema, pulses intact, no cyanosis or clubbing.  GI:   Soft & nt; nml bowel sounds; no organomegaly or masses detected.  Musco: Warm bil, no deformities or joint swelling noted.   Neuro: alert, no focal deficits noted.    Skin: Warm, no lesions or rashes         Assessment & Plan:

## 2011-11-06 ENCOUNTER — Telehealth: Payer: Self-pay | Admitting: Pulmonary Disease

## 2011-11-06 MED ORDER — AMOXICILLIN-POT CLAVULANATE 875-125 MG PO TABS: 1.0000 | ORAL_TABLET | Freq: Two times a day (BID) | ORAL | Status: AC

## 2011-11-06 NOTE — Telephone Encounter (Signed)
Pt returning call.Monique [name] ° °

## 2011-11-06 NOTE — Telephone Encounter (Signed)
Verified with patient that she has NKDA. Augmentin sent electronically to [facility] and she was made aware of additional recs from [facility] and verbalized understanding.

## 2011-11-06 NOTE — Telephone Encounter (Signed)
LMOMTCB x 1 

## 2011-11-06 NOTE — Telephone Encounter (Signed)
Returning call.

## 2011-11-06 NOTE — Telephone Encounter (Signed)
LMOMTCB x 1. Need to verify NKDA before sending medications to her pharmacy.

## 2011-11-06 NOTE — Telephone Encounter (Signed)
Pt says she is still taking the Zyrtec at bedtime and Mucinex DM twice daily as instructed by TP. Her cough is no longer prod and her throat is not sore now. Cough is dry and she now has nasal congestion that is green in color. She denies any fever. She wants to know if an abx would help clear this up. Pls advise.No Known Allergies

## 2011-11-06 NOTE — Telephone Encounter (Signed)
Per SN---ok to give pt augmentin 875mg   #14  1 po bid until gone, use align one daily while on the abx and use  Nasal saline spray every hour while awake.  Make sure pt has no drug allergies.  thanks

## 2012-01-01 ENCOUNTER — Telehealth: Payer: Self-pay | Admitting: Internal Medicine

## 2012-01-01 ENCOUNTER — Encounter: Payer: Self-pay | Admitting: Pulmonary Disease

## 2012-01-01 ENCOUNTER — Ambulatory Visit (INDEPENDENT_AMBULATORY_CARE_PROVIDER_SITE_OTHER): Payer: Federal, State, Local not specified - PPO | Admitting: Pulmonary Disease

## 2012-01-01 VITALS — BP 92/64 | HR 66 | Temp 97.7°F | Ht 67.5 in | Wt 132.4 lb

## 2012-01-01 DIAGNOSIS — F329 Major depressive disorder, single episode, unspecified: Secondary | ICD-10-CM

## 2012-01-01 DIAGNOSIS — I059 Rheumatic mitral valve disease, unspecified: Secondary | ICD-10-CM

## 2012-01-01 DIAGNOSIS — B1710 Acute hepatitis C without hepatic coma: Secondary | ICD-10-CM

## 2012-01-01 DIAGNOSIS — R1310 Dysphagia, unspecified: Secondary | ICD-10-CM

## 2012-01-01 DIAGNOSIS — M79609 Pain in unspecified limb: Secondary | ICD-10-CM

## 2012-01-01 DIAGNOSIS — J42 Unspecified chronic bronchitis: Secondary | ICD-10-CM

## 2012-01-01 DIAGNOSIS — F319 Bipolar disorder, unspecified: Secondary | ICD-10-CM | POA: Insufficient documentation

## 2012-01-01 DIAGNOSIS — F411 Generalized anxiety disorder: Secondary | ICD-10-CM

## 2012-01-01 DIAGNOSIS — J309 Allergic rhinitis, unspecified: Secondary | ICD-10-CM

## 2012-01-01 DIAGNOSIS — F32A Depression, unspecified: Secondary | ICD-10-CM | POA: Insufficient documentation

## 2012-01-01 DIAGNOSIS — K589 Irritable bowel syndrome without diarrhea: Secondary | ICD-10-CM

## 2012-01-01 DIAGNOSIS — M79645 Pain in left finger(s): Secondary | ICD-10-CM

## 2012-01-01 MED ORDER — METOPROLOL TARTRATE 50 MG PO TABS: ORAL_TABLET | ORAL | Status: DC

## 2012-01-01 MED ORDER — DICLOFENAC SODIUM 1 % TD GEL: 1.0000 "application " | Freq: Two times a day (BID) | TRANSDERMAL | Status: DC

## 2012-01-01 MED ORDER — PANTOPRAZOLE SODIUM 40 MG PO TBEC: 40.0000 mg | DELAYED_RELEASE_TABLET | Freq: Every day | ORAL | Status: DC

## 2012-01-01 MED ORDER — FLUTICASONE PROPIONATE 50 MCG/ACT NA SUSP: 1.0000 | Freq: Every day | NASAL | Status: DC

## 2012-01-01 MED ORDER — PREDNISONE (PAK) 5 MG PO TABS: ORAL_TABLET | ORAL | Status: DC

## 2012-01-01 NOTE — Patient Instructions (Signed)
Today we updated your med list in our EPIC system...    Continue your current medications the same...    We refilled your meds per request   For your thumb pain:    Get a wrist/ thumb splint at the drug store...    Do some warm soaks as needed or try the biofreeze...    We wrote a prescription for VOLTAREN GELL to apply twice daily as needed...    You may use your TRAMADOL up to 3 times daily... We will set up an appt w. [facility] hand specialist [name]...  For your swallowing difficulty:    We will start PROTONIX one tab daily taken 30 min prior to the 1st meal of the day...    We will arrange for a GI consultation w/ [name]...  We also wrote a new prescription for METOPROLOL 50mg  to tak 1/2 to 1 tab up to twice daily as needed for the palpitations...  Call for any questions...  Let's plan a follow up visit in 6 months or sooner if needed for problems.Marland KitchenMarland Kitchen

## 2012-01-01 NOTE — Telephone Encounter (Signed)
Scheduled patient on 01/06/12 at 1:30 PM with Willette Cluster, NP. Libby notified patient.

## 2012-01-01 NOTE — Progress Notes (Signed)
Subjective:     Patient ID: Monique [name], female   DOB: 12/25/1955, 56 y.o.   MRN: [id number]  HPI 56 y/o WF here for a follow up visit... he has multiple medical problems as noted below...  ~  July 02, 2010:  Yearly ROV & add-on for URI symptoms> started w/sinus pressure, head & chest congestion, dry hacking cough, ?fever & chills, +swollen glands, and chest tightness x several days... taking OTC meds w/o relief & we discussed checking CXR (chr changes, NAD), & Rx w/ Depo/ dosepak/ Augmentin/ Tussionex... otherw notes stable> hx MVP- no CP, rare palpit, denies SOB etc... GI stable and she had her f/u colonoscopy 9/09> neg, no polyps & f/u 78yrs...  she still sees [name] on Zoloft & Lorazepam...  ~  October 15, 2010:  she went to [facility] 10/02/10 w/ URI "I couldn't talk" & had CXR showing biapical pleuroparenchymal scarring & rather than checking old films in the [facility] sys they scared the bejesus out of her thinking she had pneumonia, lung ca, or worse... we did CXR 10/11 w/ no ch in this area over a 28yr period & she is reassured... incidently- no CP, cough, sputum, f/ c/ s, etc.  ~  January 01, 2012:  12mo ROV & Monique [name] recently saw TP w/ URI, congestion, drainage, etc; treated w/ Zyrtek, Mucinex, & Tylenol & did well;  She is under increased stress caring for her elderly parents (mother is Monique [name]);  Her CC today is "my thumb" c/o discomfort on left thumb at MCP joint> rec hot soaks, apply splint, anti-inflamm med rx (Voltaren Gel, Tramadol) & she wants referral to [name]- ok...  She also c/o intermittent dysphagia & states that even water will choke her on occas (occurs ?2d per week)> we discuused Rx w/ Protonix taken 30 min before a meal & f/u w/ [name] for further eval... Finally she notes additional palpitations on occas & was prev on BBlocker from [name]> she knows to avoid caffeine, etc;  Rx w/ Metoprolol 50mg - 1/2 to 1 tab as needed...    Problem List:    RHINITIS, ALLERGIC  NOS (ICD-477.9) - uses ZYRTEK, Saline, FLONASE as needed... she notes sinus & "allergy" problems on & off... she wonders about what he sensitivities are & we discussed checking IgE (36) & Rast testing (pos to cat only)...  BRONCHITIS, RECURRENT (ICD-491.9) - exsmoker quit 1990's (20 yrs of up to 1.5ppd prior to that), uses Mucinex as needed ~  CXR 10/11 showed sl hyperinflation, mild incr markings & apical scarring, NAD... ~  1/12:  CXR report from [facility] sounds similar to our prev films (not avail for review) see above.  MITRAL VALVE PROLAPSE (ICD-424.0) - she has MSC on exam... she notes rare palpit (self limited), & Echo 1986 w/ mild MVP. ~  2DEcho 11/08 showed norm LV w/ EF= 55-65% & no regional wall motion abn, mild thickening of MV w/ mild MR... ~  DrCDrenshaw treated her w/ BBlocker in the past for intermittent palpit & we will write for METOPROLOL 50mg  1/2 to 1 tab po prn...  ABNORMAL ELECTROCARDIOGRAM (ICD-794.31) - poor R wave progression V1-V3, no known CAD  Intermittent DYSPHAGIA >> rec to start PROTONIX 40mg  daily... ~  4/13:  She c/o intermittent dyspagia for solids & liqs that occurs 1-2 d per week; we will start PPI & refer to GI for EGD...  Hx of HEPATITIS C (ICD-070.51) - Hx IV drug abuse as teen; Abn LFT's in late 80's/ early 90's; liver Bx  93 w/ chr persist hepatitis w/o necrosis (DrPatterson); serology pos for HepB antibodies, neg HBSAg, pos HepCAb; had pos HepC PCR 2002 (genotype 2B) & Rx w/ interferon 9/02-3/03 ([name]) w/ complete disappearance of HepC viral RNA; all subseq tests have shown - nl LFT's,  pos HepCAb, & neg HepC RNA Quant PCR. ~  labs 11/10 showed normal LFTs ~  Labs 1/12 showed LFTs are WNL.Marland KitchenMarland Kitchen  IRRITABLE BOWEL SYNDROME (ICD-564.1)  Family Hx of CANCER, COLORECTAL (ICD-154.0) - Mother w/ colon Ca age 79 (resected, no known recur); pt had normal colonoscopy 4/02, and f/u colonoscopy 9/09 was also neg- no polyps w/ f/u sched 63yrs...  HSV (ICD-054.9) - lab  eval 5/08 by gyn [name])  DJD >> Rec to take TRAMADOL 50mg  prn & use VOLTAREN Gel when needed... ~  4/13: she is c/o pain in left thumb MCP joint; rec hot soaks, meds above, refer to [name] per her request...  SYMPTOM, MEMORY LOSS (ICD-780.93) - new complaint 9/08 w/ essent nl MMSE, refer to neuro fo rfurther testing per pt request... seen by [name] who felt her memory disturbance, fatigue, & pain were c/w Fibromyalgia> EEG was normal, MRI brain was normal, Sleep Study was relatively unremarkable, and Labs were neg including Lyme titer... Rx'd w/ Seroquel 50mg  Qhs, tried Provigil10mg /d, w/ consideration for use of Cymbalta or Adderall depending on response.  Hx of PARESTHESIA (ICD-782.0) - eval by neuro ([name]) nothing found... she uses TRAMADOL 50mg  Prn.  ANXIETY & DEPRESSION >> Rx by [name] on Lamictal prev- off now &  taking ZOLOFT 100mg /d & LORAZEPAM 0.5mg - taking 1.5 tabs at bedtime... ~  She wants to be sure that the chart reflects the fact that she does not have Bipolar illness (this Dx was entertained by [name] but she decided that [place] didn't have this)...  ANXIETY (ICD-300.00) - takes Lorazepam as noted...  SKIN CANCER, HX OF (ICD-V10.83) - several basal cell carcinomas removed in past.  Health Maintenance: ~  GI:  followed by [name]> colonoscopy 9/09 was neg- no polyps seen, f/u 72yrs. ~  GYN:  followed by [name] for PAP, Mammograms, BMD (?when done last & ?results)- Rx Calcium, MVI, Vit D). ~  Immunizations:  she declines the yearly seasonal Flu vaccine...   Past Surgical History  Procedure Date  . Rhinoplasty   . Tubal ligation     bilateral  . Appendectomy 11/08    Outpatient Encounter Prescriptions as of 01/01/2012  Medication Sig Dispense Refill  . fluticasone (FLONASE) 50 MCG/ACT nasal spray Place 1 spray into the nose daily.  16 g  6  . LORazepam (ATIVAN) 1 MG tablet Take 1 mg by mouth every 8 (eight) hours.      . sertraline (ZOLOFT) 100 MG  tablet Take 100 mg by mouth at bedtime.      Marland Kitchen ULTRAM 50 MG tablet TAKE ONE TABLET EVERY 6  HOURS AS NEEDED.  50 each  5    No Known Allergies   Current Medications, Allergies, Past Medical History, Past Surgical History, Family History, and Social History were reviewed in [employer] record.   Review of Systems        See HPI - all other systems neg except as noted...       The patient complains of dyspnea on exertion.  The patient denies anorexia, fever, weight loss, weight gain, vision loss, decreased hearing, hoarseness, chest pain, syncope, peripheral edema, prolonged cough, headaches, hemoptysis, abdominal pain, melena, hematochezia, severe indigestion/heartburn, hematuria, incontinence, muscle weakness, suspicious  skin lesions, transient blindness, difficulty walking, depression, unusual weight change, abnormal bleeding, enlarged lymph nodes, and angioedema.     Objective:   Physical Exam    WD, WN, 56 y/o WF in NAD... she is somewhat anxious. GENERAL:  Alert & oriented; pleasant & cooperative... HEENT:  [place]/AT, EOM-wnl, PERRLA, Fundi-benign, EACs-clear, TMs-wnl, NOSE-clear, THROAT-clear & wnl. NECK:  Supple w/ fairROM; no JVD; normal carotid impulses w/o bruits; no thyromegaly or nodules palpated; no lymphadenopathy. CHEST:  Clear to P & A; without wheezes/ rales/ or rhonchi. HEART:  Regular Rhythm; without murmurs/ rubs/ or gallops. ABDOMEN:  Soft & nontender; normal bowel sounds; no organomegaly or masses detected. EXT: without deformities or arthritic changes; no varicose veins/ venous insuffic/ or edema; +triggers... NEURO:  CN's intact; motor testing normal; sensory testing normal; gait normal & balance OK. DERM:  No lesions noted; no rash etc...  RADIOLOGY DATA:  Reviewed in the EPIC EMR & discussed w/ the patient...  LABORATORY DATA:  Reviewed in the EPIC EMR & discussed w/ the patient...   Assessment:     AR, Bronchits>  She was allergic to  cats on RAST test 2012; she gets occas bronchitic infections requiring antibiotis...  MVP>  As noted- she gets occas palpit assoc w/ nerves etc;  We decided to treat w/ METOPROLOL 50mg - 1/2=1 tab po bid as needed...  DYSPHAGIA>  New complaint w/ intermittent upper esoph/ throat symptoms w/ choking that occurs 1-2 per week;  Asked to swallow slowly & deliberately, start PROTONIX 40mg /d; refer to GI for eval & prob EGD...  IBS, Constip, +FamHx colon ca>  sheis up to date on screening & we reviewed Miralax, Senakot-S, etc....  HxHepC>  Treated 2002-3 w/ Inerferon & resolved in clinical remission & LFTs have remained normal...  DJD>  We discussed use of Voltaren Gel & Tramadol for any aches or pains...  Anxiety/ Depression>  Followed by [name] & stable on Zoloft & Lorazepam... Pt notes tht she is NOT bipolar...     Plan:     Patient's Medications  New Prescriptions   DICLOFENAC SODIUM (VOLTAREN) 1 % GEL    Apply 1 application topically 2 (two) times daily.   METOPROLOL (LOPRESSOR) 50 MG TABLET    Take 1/2 to 1 tablet by mouth two times daily as directed   PANTOPRAZOLE (PROTONIX) 40 MG TABLET    Take 1 tablet (40 mg total) by mouth daily. 30 minutes before a meal   PREDNISONE (STERAPRED UNI-PAK) 5 MG TABS    Take as directed.  Please give a 6 day pack  Previous Medications   LORAZEPAM (ATIVAN) 1 MG TABLET    Take 1 mg by mouth every 8 (eight) hours.   SERTRALINE (ZOLOFT) 100 MG TABLET    Take 100 mg by mouth at bedtime.   ULTRAM 50 MG TABLET    TAKE ONE TABLET EVERY 6  HOURS AS NEEDED.  Modified Medications   Modified Medication Previous Medication   FLUTICASONE (FLONASE) 50 MCG/ACT NASAL SPRAY fluticasone (FLONASE) 50 MCG/ACT nasal spray      Place 1 spray into the nose daily.    Place 1 spray into the nose daily.  Discontinued Medications   No medications on file

## 2012-01-06 ENCOUNTER — Ambulatory Visit (INDEPENDENT_AMBULATORY_CARE_PROVIDER_SITE_OTHER): Payer: Federal, State, Local not specified - PPO | Admitting: Nurse Practitioner

## 2012-01-06 ENCOUNTER — Encounter: Payer: Self-pay | Admitting: Internal Medicine

## 2012-01-06 ENCOUNTER — Encounter: Payer: Self-pay | Admitting: Nurse Practitioner

## 2012-01-06 ENCOUNTER — Ambulatory Visit: Payer: Federal, State, Local not specified - PPO | Admitting: Nurse Practitioner

## 2012-01-06 DIAGNOSIS — R1310 Dysphagia, unspecified: Secondary | ICD-10-CM

## 2012-01-06 NOTE — Progress Notes (Signed)
01/06/2012 Monique [name] [id number] 1956-07-20   HISTORY OF PRESENT ILLNESS:ie Patient is a 56 year old female known to Dr. Juanda Chance for a history of hepatitis C (treated) and a family history of colon cancer. She had a screening colonoscopy September 2009, exam normal, no polyps. Next colonoscopy due October 2014. Patient was referred here today for evaluation of dysphagia. She has had occasional solid food dysphagia for years but foods getting stuck more frequency. Rice is definitely problematic. No heartburn.  Patient was seen by pulmonary on 01/01/12 at which time she complained of dysphagia to both solids and liquids. Patient was started on a proton pump inhibitor but not taking it secondary to concerns for osteoporosis.   Past Medical History  Diagnosis Date  . Allergic rhinitis   . Unspecified chronic bronchitis   . MVP (mitral valve prolapse)   . Abnormal electrocardiogram   . History of hepatitis C   . HSV infection   . Memory loss   . Paresthesia   . Bipolar disorder   . Anxiety   . History of skin cancer    Past Surgical History  Procedure Date  . Rhinoplasty   . Tubal ligation     bilateral  . Appendectomy 11/08    reports that she quit smoking about 15 years ago. Her smoking use included Cigarettes. She has a 37.5 pack-year smoking history. She does not have any smokeless tobacco history on file. She reports that she drinks alcohol. Her drug history not on file. family history includes Allergies in her father and mother; Asthma in an unspecified family member; Atrial fibrillation in her mother; COPD in her mother and paternal uncle; Cancer in her father; Colon cancer in her mother; Heart disease in her mother; Heart failure in her mother; and Rheum arthritis in her father. No Known Allergies    Outpatient Encounter Prescriptions as of 01/06/2012  Medication Sig Dispense Refill  . Calcium Carbonate-Vit D-Min (CALCIUM 1200 PO) Take by mouth. Take 1 tab once daily.      .  Cholecalciferol (D3 DOTS) 2000 UNITS TBDP Take by mouth. Take 1 tab twice daily.      . diclofenac sodium (VOLTAREN) 1 % GEL Apply 1 application topically 2 (two) times daily.  100 g  3  . fluticasone (FLONASE) 50 MCG/ACT nasal spray Place 1 spray into the nose daily.  16 g  11  . LORazepam (ATIVAN) 1 MG tablet Take 1 mg by mouth every 8 (eight) hours.      . metoprolol (LOPRESSOR) 50 MG tablet Take 1/2 to 1 tablet by mouth two times daily as directed  60 tablet  6  . Omega-3 Fatty Acids (FISH OIL) 1000 MG CAPS Take by mouth. Take 1 cap by mouth daily.      . pantoprazole (PROTONIX) 40 MG tablet Take 1 tablet (40 mg total) by mouth daily. 30 minutes before a meal  30 tablet  6  . predniSONE (STERAPRED UNI-PAK) 5 MG TABS Take as directed.  Please give a 6 day pack  1 each  0  . sertraline (ZOLOFT) 100 MG tablet Take 100 mg by mouth at bedtime.      Marland Kitchen ULTRAM 50 MG tablet TAKE ONE TABLET EVERY 6  HOURS AS NEEDED.  50 each  5    REVIEW OF SYSTEMS  : Positive for sinus trouble, anxiety, arthritis, back pain, vision changes, confusion, cough, depression, fatigue, rhythm changes, sleeping problems, sore throat, swollen lymph nodes. All other systems reviewed and negative except where  noted in the History of Present Illness.   PHYSICAL EXAM: BP 102/64  Pulse 72  Ht 5\' 7"  (1.702 m)  Wt 130 lb 9.6 oz (59.24 kg)  BMI 20.45 kg/m2 General: Well developed white female in no acute distress Head: Normocephalic and atraumatic Eyes:  sclerae anicteric,conjunctive pink. Ears: Normal auditory acuity Neck: Supple, no masses.  Lungs: Clear throughout to auscultation Heart: Regular rate and rhythm Abdomen: Soft, non distended, nontender. No masses or hepatomegaly noted. Normal Bowel sounds Rectal: not done Musculoskeletal: Symmetrical with no gross deformities  Skin: No lesions on visible extremities Extremities: No edema or deformities noted Neurological: Alert oriented , grossly nonfocal Cervical Nodes:   No significant cervical adenopathy Psychological:  Alert and cooperative. Normal mood and affect  ASSESSMENT AND PLAN;  1. solid food dysphagia, rule out stricture, esophageal ring, or maybe just dysmotility. For further evaluation patient will be scheduled for EGD. The benefits, risks, and potential complications of EGD with possible biopsies and/or dilation were discussed with the patient and she agrees to proceed.   2. Family history of colon cancer, up to date on screening.

## 2012-01-06 NOTE — Patient Instructions (Signed)
We have scheduled you for an Endoscopy with Dr. Lina Sar on 01-23-2012. Directions provided.  Between now and then, take very small bites of food when eating, especially breads and meats.  Chew the food thoroughly before swallowing.

## 2012-01-07 ENCOUNTER — Encounter: Payer: Self-pay | Admitting: Nurse Practitioner

## 2012-01-08 NOTE — Progress Notes (Signed)
Agree with Ms. Guenther's assessment and plan. Garrit Marrow E. Julienne Vogler, MD, FACG   

## 2012-01-15 ENCOUNTER — Telehealth: Payer: Self-pay | Admitting: Internal Medicine

## 2012-01-16 NOTE — Telephone Encounter (Signed)
Left a message for patient to call me. 

## 2012-01-16 NOTE — Telephone Encounter (Signed)
Please schedule without Propofol

## 2012-01-16 NOTE — Telephone Encounter (Signed)
Spoke with patient and her insurance will not pay for Propofol and she would like to have procedure without it. Please, advise.

## 2012-01-17 NOTE — Telephone Encounter (Signed)
Left a message for patient to call me. 

## 2012-01-20 NOTE — Telephone Encounter (Signed)
Left a message for patient to call me. 

## 2012-01-20 NOTE — Telephone Encounter (Signed)
Patient left me a message requesting I leave information on her voice mail if unable to reach her. Left a message as requested.

## 2012-01-23 ENCOUNTER — Other Ambulatory Visit: Payer: Self-pay | Admitting: Adult Health

## 2012-01-23 ENCOUNTER — Encounter: Payer: Self-pay | Admitting: Internal Medicine

## 2012-01-23 ENCOUNTER — Ambulatory Visit (AMBULATORY_SURGERY_CENTER): Payer: Federal, State, Local not specified - PPO | Admitting: Internal Medicine

## 2012-01-23 ENCOUNTER — Ambulatory Visit: Payer: Federal, State, Local not specified - PPO | Admitting: Internal Medicine

## 2012-01-23 DIAGNOSIS — R1319 Other dysphagia: Secondary | ICD-10-CM

## 2012-01-23 MED ORDER — SODIUM CHLORIDE 0.9 % IV SOLN: 500.0000 mL | INTRAVENOUS | Status: DC

## 2012-01-23 NOTE — Patient Instructions (Signed)
YOU HAD AN ENDOSCOPIC PROCEDURE TODAY AT THE [facility]: Refer to the procedure report that was given to you for any specific questions about what was found during the examination.  If the procedure report does not answer your questions, please call your gastroenterologist to clarify.  If you requested that your care partner not be given the details of your procedure findings, then the procedure report has been included in a sealed envelope for you to review at your convenience later.  YOU SHOULD EXPECT: Some feelings of bloating in the abdomen. Passage of more gas than usual.  Walking can help get rid of the air that was put into your GI tract during the procedure and reduce the bloating. If you had a lower endoscopy (such as a colonoscopy or flexible sigmoidoscopy) you may notice spotting of blood in your stool or on the toilet paper. If you underwent a bowel prep for your procedure, then you may not have a normal bowel movement for a few days.  DIET: Your first meal following the procedure should be a light meal and then it is ok to progress to your normal diet.  A half-sandwich or bowl of soup is an example of a good first meal.  Heavy or fried foods are harder to digest and may make you feel nauseous or bloated.  Likewise meals heavy in dairy and vegetables can cause extra gas to form and this can also increase the bloating.  Drink plenty of fluids but you should avoid alcoholic beverages for 24 hours.  ACTIVITY: Your care partner should take you home directly after the procedure.  You should plan to take it easy, moving slowly for the rest of the day.  You can resume normal activity the day after the procedure however you should NOT DRIVE or use heavy machinery for 24 hours (because of the sedation medicines used during the test).    SYMPTOMS TO REPORT IMMEDIATELY: A gastroenterologist can be reached at any hour.  During normal business hours, 8:30 AM to 5:00 PM Monday through Friday,  call [phone].  After hours and on weekends, please call the GI answering service at [phone] who will take a message and have the physician on call contact you.  Following upper endoscopy (EGD)  Vomiting of blood or coffee ground material  New chest pain or pain under the shoulder blades  Painful or persistently difficult swallowing  New shortness of breath  Fever of 100F or higher  Black, tarry-looking stools  FOLLOW UP: If any biopsies were taken you will be contacted by phone or by letter within the next 1-3 weeks.  Call your gastroenterologist if you have not heard about the biopsies in 3 weeks.  Our staff will call the home number listed on your records the next business day following your procedure to check on you and address any questions or concerns that you may have at that time regarding the information given to you following your procedure. This is a courtesy call and so if there is no answer at the home number and we have not heard from you through the emergency physician on call, we will assume that you have returned to your regular daily activities without incident.  SIGNATURES/CONFIDENTIALITY: You and/or your care partner have signed paperwork which will be entered into your electronic medical record.  These signatures attest to the fact that that the information above on your After Visit Summary has been reviewed and is understood.  Full responsibility of   the confidentiality of this discharge information lies with you and/or your care-partner. 

## 2012-01-23 NOTE — Progress Notes (Signed)
Patient did not have preoperative order for IV antibiotic SSI prophylaxis. (G8918)  Patient did not experience any of the following events: a burn prior to discharge; a fall within the facility; wrong site/side/patient/procedure/implant event; or a hospital transfer or hospital admission upon discharge from the facility. (G8907)  

## 2012-01-24 ENCOUNTER — Telehealth: Payer: Self-pay | Admitting: *Deleted

## 2012-01-24 ENCOUNTER — Other Ambulatory Visit: Payer: Self-pay | Admitting: Adult Health

## 2012-01-24 NOTE — Telephone Encounter (Signed)
No answer left message to call office if questions or concerns. 

## 2012-01-24 NOTE — Telephone Encounter (Signed)
Spoke with pt. She states that she had endo done yesterday and is having severe sore throat and her throat is "bloody red". I advised that she needs to call GI and discuss this with them since they did the procedure. She verbalized understanding and states nothing further needed.

## 2012-01-24 NOTE — Op Note (Signed)
[facility] [address] [place], [place]  [address]  ENDOSCOPY PROCEDURE REPORT  PATIENT:  Monique [name], Monique [name]  MR#:  [id number] BIRTHDATE:  03/11/1956, 55 yrs. old  GENDER:  female  ENDOSCOPIST:  Hedwig Morton. Juanda Chance, MD Referred by:  Alroy Dust, M.D.  PROCEDURE DATE:  01/23/2012 PROCEDURE:  EGD, diagnostic 43235, Maloney Dilation of Esophagus ASA CLASS:  Class I INDICATIONS:  dysphagia, GERD  MEDICATIONS:   These medications were titrated to patient response per physician's verbal order, Versed 8 mg, Fentanyl 75 mcg TOPICAL ANESTHETIC:  Cetacaine Spray  DESCRIPTION OF PROCEDURE:   After the risks benefits and alternatives of the procedure were thoroughly explained, informed consent was obtained.  The [id number] endoscope was introduced through the mouth and advanced to the second portion of the duodenum, without limitations.  The instrument was slowly withdrawn as the mucosa was fully examined. <<PROCEDUREIMAGES>>  The upper, middle, and distal third of the esophagus were carefully inspected and no abnormalities were noted. The z-line was well seen at the GEJ. The endoscope was pushed into the fundus which was normal including a retroflexed view. The antrum,gastric body, first and second part of the duodenum were unremarkable (see image1, image2, image3, image5, image4, image6, and image7). no evidence of esophageal stricture normal z-line, slight spasm at the LES level maloney dilator 69F maloney dil passed without difficulty    Retroflexed views revealed no abnormalities.    The scope was then withdrawn from the patient and the procedure completed.  COMPLICATIONS:  None  ENDOSCOPIC IMPRESSION: 1) Normal EGD no stricture, passage of 54 F maloney dolator RECOMMENDATIONS: 1) Anti-reflux regimen to be follow cont PPI, if dysphagia continues, will do Barium esophagram to assess motility  REPEAT EXAM:  In 0 year(s)  for.  ______________________________ Hedwig Morton. Juanda Chance, MD  CC:  n. eSIGNED:   Hedwig Morton. Kinesha Auten at 01/23/2012 03:51 PM  [place], [phone]

## 2012-01-24 NOTE — Telephone Encounter (Signed)
LMTCBx1 to speak with pt about need for MMW. I do not see where this has been prescribed recently. Carron Curie, CMA

## 2012-01-29 MED ORDER — FIRST-DUKES MOUTHWASH MT SUSP: OROMUCOSAL | Status: DC

## 2012-01-29 NOTE — Telephone Encounter (Signed)
Okay per TP to fill.  Will change from powdered form to suspension.

## 2012-05-28 ENCOUNTER — Other Ambulatory Visit (INDEPENDENT_AMBULATORY_CARE_PROVIDER_SITE_OTHER): Payer: Federal, State, Local not specified - PPO

## 2012-05-28 DIAGNOSIS — R1310 Dysphagia, unspecified: Secondary | ICD-10-CM

## 2012-05-28 DIAGNOSIS — F411 Generalized anxiety disorder: Secondary | ICD-10-CM

## 2012-05-28 DIAGNOSIS — I059 Rheumatic mitral valve disease, unspecified: Secondary | ICD-10-CM

## 2012-05-28 LAB — BASIC METABOLIC PANEL
Calcium: 9 mg/dL (ref 8.4–10.5)
GFR: 96.54 mL/min (ref >60.00)
Sodium: 139 mEq/L (ref 135–145)

## 2012-05-28 LAB — CBC WITH DIFFERENTIAL/PLATELET
Basophils Absolute: 0 10*3/uL (ref 0.0–0.1)
Basophils Relative: 0.7 % (ref 0.0–3.0)
HCT: 39.8 % (ref 36.0–46.0)
Hemoglobin: 13.1 g/dL (ref 12.0–15.0)
Lymphs Abs: 1.6 10*3/uL (ref 0.7–4.0)
MCHC: 32.9 g/dL (ref 30.0–36.0)
Monocytes Relative: 8.4 % (ref 3.0–12.0)
Neutro Abs: 3.6 10*3/uL (ref 1.4–7.7)
RDW: 14.7 % — ABNORMAL HIGH (ref 11.5–14.6)

## 2012-05-28 LAB — LIPID PANEL
HDL: 73.9 mg/dL (ref >39.00)
Total CHOL/HDL Ratio: 2
VLDL: 10.8 mg/dL (ref 0.0–40.0)

## 2012-05-28 LAB — HEPATIC FUNCTION PANEL
Albumin: 4 g/dL (ref 3.5–5.2)
Alkaline Phosphatase: 61 U/L (ref 39–117)
Bilirubin, Direct: 0.1 mg/dL (ref 0.0–0.3)

## 2012-06-02 ENCOUNTER — Telehealth: Payer: Self-pay | Admitting: Pulmonary Disease

## 2012-06-02 NOTE — Telephone Encounter (Signed)
Notes Recorded by Michele Mcalpine, MD on 06/02/2012 at 8:17 AM Please notify patient> SMN Labs done prior to planned 07/02/12 f/u ov are all WNL.Marland KitchenMarland Kitchen  I spoke with patient about results and she verbalized understanding and had no questions

## 2012-07-01 ENCOUNTER — Encounter: Payer: Self-pay | Admitting: *Deleted

## 2012-07-02 ENCOUNTER — Ambulatory Visit: Payer: Federal, State, Local not specified - PPO | Admitting: Pulmonary Disease

## 2012-07-13 ENCOUNTER — Encounter: Payer: Self-pay | Admitting: Pulmonary Disease

## 2012-07-13 ENCOUNTER — Ambulatory Visit (INDEPENDENT_AMBULATORY_CARE_PROVIDER_SITE_OTHER)
Admission: RE | Admit: 2012-07-13 | Discharge: 2012-07-13 | Disposition: A | Discharge status: Home / Self Care | Payer: Federal, State, Local not specified - PPO | Source: Ambulatory Visit | Attending: Pulmonary Disease | Admitting: Pulmonary Disease

## 2012-07-13 ENCOUNTER — Encounter: Payer: Self-pay | Admitting: *Deleted

## 2012-07-13 ENCOUNTER — Ambulatory Visit (INDEPENDENT_AMBULATORY_CARE_PROVIDER_SITE_OTHER): Payer: Federal, State, Local not specified - PPO | Admitting: Pulmonary Disease

## 2012-07-13 VITALS — BP 122/60 | HR 63 | Temp 97.5°F | Ht 67.5 in | Wt 129.8 lb

## 2012-07-13 DIAGNOSIS — M79609 Pain in unspecified limb: Secondary | ICD-10-CM

## 2012-07-13 DIAGNOSIS — R079 Chest pain, unspecified: Secondary | ICD-10-CM

## 2012-07-13 DIAGNOSIS — M79646 Pain in unspecified finger(s): Secondary | ICD-10-CM

## 2012-07-13 DIAGNOSIS — R0781 Pleurodynia: Secondary | ICD-10-CM

## 2012-07-13 DIAGNOSIS — J309 Allergic rhinitis, unspecified: Secondary | ICD-10-CM

## 2012-07-13 DIAGNOSIS — M1990 Unspecified osteoarthritis, unspecified site: Secondary | ICD-10-CM

## 2012-07-13 DIAGNOSIS — I059 Rheumatic mitral valve disease, unspecified: Secondary | ICD-10-CM

## 2012-07-13 DIAGNOSIS — Z23 Encounter for immunization: Secondary | ICD-10-CM

## 2012-07-13 DIAGNOSIS — K589 Irritable bowel syndrome without diarrhea: Secondary | ICD-10-CM

## 2012-07-13 MED ORDER — METHYLPREDNISOLONE 4 MG PO KIT: PACK | ORAL | Status: DC

## 2012-07-13 NOTE — Progress Notes (Signed)
Subjective:     Patient ID: Monique [name], female   DOB: August 23, 1956, 56 y.o.   MRN: [id number]  HPI 56 y/o WF here for a follow up visit... he has multiple medical problems as noted below...  ~  July 02, 2010:  Yearly ROV & add-on for URI symptoms> started w/sinus pressure, head & chest congestion, dry hacking cough, ?fever & chills, +swollen glands, and chest tightness x several days... taking OTC meds w/o relief & we discussed checking CXR (chr changes, NAD), & Rx w/ Depo/ dosepak/ Augmentin/ Tussionex... otherw notes stable> hx MVP- no CP, rare palpit, denies SOB etc... GI stable and she had her f/u colonoscopy 9/09> neg, no polyps & f/u 24yrs...  she still sees Monique [name] on Zoloft & Lorazepam...  ~  October 15, 2010:  she went to [facility] 10/02/10 w/ URI "I couldn't talk" & had CXR showing biapical pleuroparenchymal scarring & rather than checking old films in the [facility] sys they scared the bejesus out of her thinking she had pneumonia, lung ca, or worse... we did CXR 10/11 w/ no ch in this area over a 62yr period & she is reassured... incidently- no CP, cough, sputum, f/ c/ s, etc.  ~  January 01, 2012:  72mo ROV & Monique [name] recently saw TP w/ URI, congestion, drainage, etc; treated w/ Zyrtek, Mucinex, & Tylenol & did well;  She is under increased stress caring for her elderly parents (mother is Monique [name]);  Her CC today is "my thumb" c/o discomfort on left thumb at MCP joint> rec hot soaks, apply splint, anti-inflamm med rx (Voltaren Gel, Tramadol) & she wants referral to Monique [name]- ok...  She also c/o intermittent dysphagia & states that even water will choke her on occas (occurs ?2d per week)> we discuused Rx w/ Protonix taken 30 min before a meal & f/u w/ Monique [name] for further eval... Finally she notes additional palpitations on occas & was prev on BBlocker from Monique [name]> she knows to avoid caffeine, etc;  Rx w/ Metoprolol 50mg - 1/2 to 1 tab as needed...  ~  July 13, 2012:  58mo ROV & Monique [name] is  doing well overall- just had skin cancer removed from upper lip via Moh's;  She notes some recurrent pain at left thumb & wrist (saw Monique [name] w/ XRays showing mild arthritis per pt <we don't have notes from him> & treated w/ Dosepak & Tramadol; also c/o some left ribcage discomfort & exam shows some tenderness, but clear lungs...    We reviewed prob list, meds, xrays and labs> see below for updates >> OK Flu shot today... LABS 8/13:  FLP- wnl on diet alone;  Chems- wnl;  CBC- wnl;  TSH=2.53 CXR 10/13 showed normal heart size, clear lungs, NAD...   Problem List:    RHINITIS, ALLERGIC NOS (ICD-477.9) - uses ZYRTEK, Saline, FLONASE as needed... she notes sinus & "allergy" problems on & off... she wonders about what he sensitivities are & we discussed checking IgE (36) & Rast testing (pos to cat only)...  BRONCHITIS, RECURRENT (ICD-491.9) - exsmoker quit 1990's (20 yrs of up to 1.5ppd prior to that), uses Mucinex as needed ~  CXR 10/11 showed sl hyperinflation, mild incr markings & apical scarring, NAD... ~  1/12:  CXR report from [facility] sounds similar to our prev films (not avail for review) see above. ~  CXR 10/13 showed normal heart size, clear lungs, NAD...  MITRAL VALVE PROLAPSE (ICD-424.0) - she has MSC on exam... she notes rare palpit (self limited), & Echo  1986 w/ mild MVP. ~  2DEcho 11/08 showed norm LV w/ EF= 55-65% & no regional wall motion abn, mild thickening of MV w/ mild MR... ~  DrCDrenshaw treated her w/ BBlocker in the past for intermittent palpit & we will write for METOPROLOL 50mg  1/2 to 1 tab po prn...  ABNORMAL ELECTROCARDIOGRAM (ICD-794.31) - poor R wave progression V1-V3, no known CAD  Intermittent DYSPHAGIA >> rec to start PROTONIX 40mg  daily==> she is only taking it prn. ~  4/13:  She c/o intermittent dyspagia for solids & liqs that occurs 1-2 d per week; we will start PPI & refer to GI for EGD... ~  4/13:  Seen by Monique [name] w/ EGD that was normal, Maloney54 dilator passed,  rec to continue antireflux regimen, consider Ba Swallow...  Hx of HEPATITIS C (ICD-070.51) - Hx IV drug abuse as teen; Abn LFT's in late 80's/ early 90's; liver Bx 93 w/ chr persist hepatitis w/o necrosis (Monique [name]); serology pos for HepB antibodies, neg HBSAg, pos HepCAb; had pos HepC PCR 2002 (genotype 2B) & Rx w/ interferon 9/02-3/03 (Monique [name]) w/ complete disappearance of HepC viral RNA; all subseq tests have shown - nl LFT's,  pos HepCAb, & neg HepC RNA Quant PCR. ~  labs 11/10 showed normal LFTs ~  Labs 1/12 showed LFTs are WNL.Marland Kitchen. ~  Labs 8/13 showed LFTs are WNL.Marland KitchenMarland Kitchen  IRRITABLE BOWEL SYNDROME (ICD-564.1) Family Hx of CANCER, COLORECTAL (ICD-154.0) - Mother w/ colon Ca age 15 (resected, no known recur); pt had normal colonoscopy 4/02, and f/u colonoscopy 9/09 was also neg- no polyps w/ f/u sched 23yrs...  HSV (ICD-054.9) - lab eval 5/08 by gyn Monique [name])  DJD >> Rec to take TRAMADOL 50mg  prn & use VOLTAREN Gel when needed... ~  4/13: she is c/o pain in left thumb MCP joint; rec hot soaks, meds above, refer to Monique [name] per her request... ~  10/13: she saw Monique [name] 9we don't have note to review) & she reports XRay showed "mild arthritis" & improved w/ dosepak  SYMPTOM, MEMORY LOSS (ICD-780.93) - new complaint 9/08 w/ essent nl MMSE, refer to neuro fo rfurther testing per pt request... seen by Monique [name] who felt her memory disturbance, fatigue, & pain were c/w Fibromyalgia> EEG was normal, MRI brain was normal, Sleep Study was relatively unremarkable, and Labs were neg including Lyme titer... Rx'd w/ Seroquel 50mg  Qhs, tried Provigil10mg /d, w/ consideration for use of Cymbalta or Adderall depending on response.  Hx of PARESTHESIA (ICD-782.0) - eval by neuro (Monique [name]) nothing found... she uses TRAMADOL 50mg  Prn.  ANXIETY & DEPRESSION >> Rx by Monique [name] on Lamictal prev- off now &  taking ZOLOFT 100mg /d & LORAZEPAM 0.5mg - taking 1.5 tabs at bedtime... ~  She wants to be sure that the chart  reflects the fact that she does not have Bipolar illness (this Dx was entertained by Monique [name] but she decided that [place] didn't have this)...  ANXIETY (ICD-300.00) - takes Lorazepam as noted...  SKIN CANCER, HX OF (ICD-V10.83) - several basal cell carcinomas removed in past... ~  10/13: she had basal cell removed from upper lip via Moh's by DrAlbertini...  Health Maintenance: ~  GI:  followed by Monique [name]> colonoscopy 9/09 was neg- no polyps seen, f/u 77yrs. ~  GYN:  followed by [name] for PAP, Mammograms, BMD (?when done last & ?results)- Rx Calcium, MVI, Vit D). ~  Immunizations:  she declines the yearly seasonal Flu vaccine...   Past Surgical History  Procedure Date  . Rhinoplasty   . Tubal ligation  bilateral  . Appendectomy 11/08    Outpatient Encounter Prescriptions as of 07/13/2012  Medication Sig Dispense Refill  . Calcium Carbonate-Vit D-Min (CALCIUM 1200 PO) Take by mouth. Take 1 tab once daily.      . Cholecalciferol (D3 DOTS) 2000 UNITS TBDP Take by mouth. Take 1 tab twice daily.      . Diphenhyd-Hydrocort-Nystatin (FIRST-DUKES MOUTHWASH) SUSP Swish and swallow 1 teaspoon four times daily as needed for sore throat.  240 mL  1  . Diphenhydramine-APAP (PERCOGESIC) 12.5-325 MG TABS Take 2 tablets by mouth at bedtime.      . fluticasone (FLONASE) 50 MCG/ACT nasal spray Place 1 spray into the nose daily.  16 g  11  . LORazepam (ATIVAN) 1 MG tablet Take 1 mg by mouth every 8 (eight) hours.      . metoprolol (LOPRESSOR) 50 MG tablet Take 1/2 to 1 tablet by mouth two times daily as directed  60 tablet  6  . Omega-3 Fatty Acids (FISH OIL) 1000 MG CAPS Take by mouth. Take 1 cap by mouth daily.      . sertraline (ZOLOFT) 100 MG tablet Take 100 mg by mouth at bedtime.      Marland Kitchen ULTRAM 50 MG tablet TAKE ONE TABLET EVERY 6  HOURS AS NEEDED.  50 each  5  . valACYclovir (VALTREX) 500 MG tablet Take 1/2 tablet daily as needed for herpes simplex      . DISCONTD: diclofenac sodium  (VOLTAREN) 1 % GEL Apply 1 application topically 2 (two) times daily.  100 g  3  . DISCONTD: pantoprazole (PROTONIX) 40 MG tablet Take 1 tablet (40 mg total) by mouth daily. 30 minutes before a meal  30 tablet  6  . DISCONTD: predniSONE (STERAPRED UNI-PAK) 5 MG TABS Take as directed.  Please give a 6 day pack  1 each  0    No Known Allergies   Current Medications, Allergies, Past Medical History, Past Surgical History, Family History, and Social History were reviewed in [employer] record.   Review of Systems        See HPI - all other systems neg except as noted...       The patient complains of dyspnea on exertion.  The patient denies anorexia, fever, weight loss, weight gain, vision loss, decreased hearing, hoarseness, chest pain, syncope, peripheral edema, prolonged cough, headaches, hemoptysis, abdominal pain, melena, hematochezia, severe indigestion/heartburn, hematuria, incontinence, muscle weakness, suspicious skin lesions, transient blindness, difficulty walking, depression, unusual weight change, abnormal bleeding, enlarged lymph nodes, and angioedema.     Objective:   Physical Exam    WD, WN, 56 y/o WF in NAD... she is somewhat anxious. GENERAL:  Alert & oriented; pleasant & cooperative... HEENT:  [place]/AT, EOM-wnl, PERRLA, Fundi-benign, EACs-clear, TMs-wnl, NOSE-clear, THROAT-clear & wnl. NECK:  Supple w/ fairROM; no JVD; normal carotid impulses w/o bruits; no thyromegaly or nodules palpated; no lymphadenopathy. CHEST:  Clear to P & A; without wheezes/ rales/ or rhonchi. HEART:  Regular Rhythm; without murmurs/ rubs/ or gallops. ABDOMEN:  Soft & nontender; normal bowel sounds; no organomegaly or masses detected. EXT: without deformities or arthritic changes; no varicose veins/ venous insuffic/ or edema; +triggers... NEURO:  CN's intact; motor testing normal; sensory testing normal; gait normal & balance OK. DERM:  No lesions noted; no rash  etc...  RADIOLOGY DATA:  Reviewed in the EPIC EMR & discussed w/ the patient...  LABORATORY DATA:  Reviewed in the EPIC EMR & discussed w/ the patient.Marland KitchenMarland Kitchen  Assessment:      AR, Bronchits>  She was allergic to cats on RAST test 2012; she gets occas bronchitic infections requiring antibiotis...  MVP>  As noted- she gets occas palpit assoc w/ nerves etc;  She has METOPROLOL 50mg - 1/2=1 tab po bid as needed...  DYSPHAGIA>  Hx intermittent upper esoph/ throat symptoms w/ choking that occurs 1-2 per week, now improved;  Neg EGD w/ dilatation 4/13 by DrBrodie, pt has stopped the Protonix on her own...  IBS, Constip, +FamHx colon ca>  She is up to date on screening & we reviewed Miralax, Senakot-S, etc....  HxHepC>  Treated 2002-3 w/ Inerferon & resolved in clinical remission & LFTs have remained normal...  DJD>  We discussed use of Voltaren Gel & Tramadol for any aches or pains...  Anxiety/ Depression>  Followed by Monique [name] & stable on Zoloft & Lorazepam... Pt notes that she is NOT bipolar...     Plan:     Patient's Medications  New Prescriptions   METHYLPREDNISOLONE (MEDROL, PAK,) 4 MG TABLET    follow package directions  Previous Medications   CALCIUM CARBONATE-VIT D-MIN (CALCIUM 1200 PO)    Take by mouth. Take 1 tab once daily.   CHOLECALCIFEROL (D3 DOTS) 2000 UNITS TBDP    Take by mouth. Take 1 tab twice daily.   DIPHENHYD-HYDROCORT-NYSTATIN (FIRST-DUKES MOUTHWASH) SUSP    Swish and swallow 1 teaspoon four times daily as needed for sore throat.   DIPHENHYDRAMINE-APAP (PERCOGESIC) 12.5-325 MG TABS    Take 2 tablets by mouth at bedtime.   FLUTICASONE (FLONASE) 50 MCG/ACT NASAL SPRAY    Place 1 spray into the nose daily.   LORAZEPAM (ATIVAN) 1 MG TABLET    Take 1 mg by mouth every 8 (eight) hours.   METOPROLOL (LOPRESSOR) 50 MG TABLET    Take 1/2 to 1 tablet by mouth two times daily as directed   OMEGA-3 FATTY ACIDS (FISH OIL) 1000 MG CAPS    Take by mouth. Take 1 cap by mouth  daily.   SERTRALINE (ZOLOFT) 100 MG TABLET    Take 100 mg by mouth at bedtime.   ULTRAM 50 MG TABLET    TAKE ONE TABLET EVERY 6  HOURS AS NEEDED.   VALACYCLOVIR (VALTREX) 500 MG TABLET    Take 1/2 tablet daily as needed for herpes simplex  Modified Medications   No medications on file  Discontinued Medications   DICLOFENAC SODIUM (VOLTAREN) 1 % GEL    Apply 1 application topically 2 (two) times daily.   PANTOPRAZOLE (PROTONIX) 40 MG TABLET    Take 1 tablet (40 mg total) by mouth daily. 30 minutes before a meal   PREDNISONE (STERAPRED UNI-PAK) 5 MG TABS    Take as directed.  Please give a 6 day pack

## 2012-07-13 NOTE — Patient Instructions (Addendum)
Today we updated your med list in our EPIC system...    Continue your current medications the same...  Today we reviewed your recent blood work- all looked good!    We also did your follow up CXR- we will call you w/ the results...  We wrote a new prescription for a Pred dosepak as we discussed...  We also gave you the 2013 Flu vaccine today...  Call for any questions...  Let's plan a follow up visit again in 6 months.Marland KitchenMarland Kitchen

## 2012-09-09 ENCOUNTER — Other Ambulatory Visit: Payer: Self-pay | Admitting: Pulmonary Disease

## 2012-09-30 HISTORY — PX: COLONOSCOPY: SHX174

## 2012-10-27 ENCOUNTER — Telehealth: Payer: Self-pay | Admitting: Pulmonary Disease

## 2012-10-27 MED ORDER — AMOXICILLIN-POT CLAVULANATE 875-125 MG PO TABS: 1.0000 | ORAL_TABLET | Freq: Two times a day (BID) | ORAL | Status: DC

## 2012-10-27 MED ORDER — HYDROCODONE-HOMATROPINE 5-1.5 MG/5ML PO SYRP: 5.0000 mL | ORAL_SOLUTION | Freq: Four times a day (QID) | ORAL | Status: DC | PRN

## 2012-10-27 MED ORDER — PREDNISONE (PAK) 5 MG PO TABS: ORAL_TABLET | ORAL | Status: DC

## 2012-10-27 NOTE — Telephone Encounter (Signed)
I spoke with pt. C/o severe productive cough (unsure color), chest congestion, nasal congestion, PND, watery eyes, HA x 2 days. No chest tx, wheezing. Taking mucinex DM. She is requesting abx, cough syrup, and prednisone. Please advise Dr. Kriste Basque thanks Last OV 07/13/12 Pending 01/11/13 No Known Allergies

## 2012-10-27 NOTE — Telephone Encounter (Signed)
i called and lmom to make the pt aware of SN recs.   i have called the hycodan to the pharmacy and nothing further is needed.

## 2012-10-27 NOTE — Telephone Encounter (Signed)
Pt returned triage's call.  Holly D Pryor ° °

## 2012-10-27 NOTE — Telephone Encounter (Signed)
Per SN--ok to send in augmentin 875 mg  #14  1 po bid, hycodan #6 oz  1 tsp every 6 hours prn cough, pred dosepak  5 mg   6 day pack, take align once daily.  thanks

## 2012-10-27 NOTE — Telephone Encounter (Signed)
lmomtcb x1 for pt 

## 2013-01-11 ENCOUNTER — Ambulatory Visit (INDEPENDENT_AMBULATORY_CARE_PROVIDER_SITE_OTHER): Payer: Federal, State, Local not specified - PPO | Admitting: Pulmonary Disease

## 2013-01-11 ENCOUNTER — Encounter: Payer: Self-pay | Admitting: Pulmonary Disease

## 2013-01-11 VITALS — BP 116/66 | HR 63 | Temp 98.2°F | Ht 67.0 in | Wt 124.6 lb

## 2013-01-11 DIAGNOSIS — F411 Generalized anxiety disorder: Secondary | ICD-10-CM

## 2013-01-11 DIAGNOSIS — I059 Rheumatic mitral valve disease, unspecified: Secondary | ICD-10-CM

## 2013-01-11 DIAGNOSIS — F3289 Other specified depressive episodes: Secondary | ICD-10-CM

## 2013-01-11 DIAGNOSIS — J309 Allergic rhinitis, unspecified: Secondary | ICD-10-CM

## 2013-01-11 DIAGNOSIS — Z85828 Personal history of other malignant neoplasm of skin: Secondary | ICD-10-CM

## 2013-01-11 DIAGNOSIS — M79645 Pain in left finger(s): Secondary | ICD-10-CM

## 2013-01-11 DIAGNOSIS — M79609 Pain in unspecified limb: Secondary | ICD-10-CM

## 2013-01-11 DIAGNOSIS — F329 Major depressive disorder, single episode, unspecified: Secondary | ICD-10-CM

## 2013-01-11 DIAGNOSIS — F32A Depression, unspecified: Secondary | ICD-10-CM

## 2013-01-11 DIAGNOSIS — M1990 Unspecified osteoarthritis, unspecified site: Secondary | ICD-10-CM

## 2013-01-11 DIAGNOSIS — K589 Irritable bowel syndrome without diarrhea: Secondary | ICD-10-CM

## 2013-01-11 MED ORDER — FLUTICASONE PROPIONATE 50 MCG/ACT NA SUSP: 2.0000 | Freq: Two times a day (BID) | NASAL | Status: DC

## 2013-01-11 NOTE — Patient Instructions (Addendum)
Today we updated your med list in our EPIC system...    Continue your current medications the same...  We filled a new prescription for FLONASE (Fluticasone) to use 2 sprays in each nostril up to twice daily...  We will also arrange for [facility] w/ DrSypher Antony Haste regarding your left thumb predicament...  Call for any questions...  Let's plan a follow up visit in 56mo, sooner if needed for problems.Marland KitchenMarland Kitchen

## 2013-01-11 NOTE — Progress Notes (Signed)
Subjective:     Patient ID: Monique [name], female   DOB: 04/27/1956, 57 y.o.   MRN: [id number]  HPI 57 y/o WF here for a follow up visit... he has multiple medical problems as noted below...  ~  October 15, 2010:  she went to [facility] 10/02/10 w/ URI "I couldn't talk" & had CXR showing biapical pleuroparenchymal scarring & rather than checking old films in the [facility] sys they scared the bejesus out of her thinking she had pneumonia, lung ca, or worse... we did CXR 10/11 w/ no ch in this area over a 51yr period & she is reassured... incidently- no CP, cough, sputum, f/ c/ s, etc.  ~  January 01, 2012:  29mo ROV & Mya recently saw TP w/ URI, congestion, drainage, etc; treated w/ Zyrtek, Mucinex, & Tylenol & did well;  She is under increased stress caring for her elderly parents (mother is [name]);  Her CC today is "my thumb" c/o discomfort on left thumb at MCP joint> rec hot soaks, apply splint, anti-inflamm med rx (Voltaren Gel, Tramadol) & she wants referral to [name]- ok...  She also c/o intermittent dysphagia & states that even water will choke her on occas (occurs ?2d per week)> we discuused Rx w/ Protonix taken 30 min before a meal & f/u w/ [name] for further eval... Finally she notes additional palpitations on occas & was prev on BBlocker from [name]> she knows to avoid caffeine, etc;  Rx w/ Metoprolol 50mg - 1/2 to 1 tab as needed...  ~  July 13, 2012:  65mo ROV & [name] is doing well overall- just had skin cancer removed from upper lip via Moh's;  She notes some recurrent pain at left thumb & wrist (saw [name] w/ XRays showing mild arthritis per pt <we don't have notes from him> & treated w/ Dosepak & Tramadol; also c/o some left ribcage discomfort & exam shows some tenderness, but clear lungs...    We reviewed prob list, meds, xrays and labs> see below for updates >> OK Flu shot today... LABS 8/13:  FLP- wnl on diet alone;  Chems- wnl;  CBC- wnl;  TSH=2.53 CXR 10/13 showed normal  heart size, clear lungs, NAD.Marland Kitchen.   ~  January 11, 2013:  65mo ROV & [name] indicates that she has been working for [employer] helping w/ her parents... Medically she is stable w/o new complaints or concerns except for discomfort in her left thumb (see below)... We reviewed the following medical problems during today's office visit >>     AR, Recurrent bronchitis> on Zyrtek, Saline, Flonase (refilled); recently treated w/ Augmentin/ Dosepak/ Hycodan & resolved back to baseline...    MVP, palpit> on Metop50- 1/2 to 1 tab prn palpit; she denies CP, recent palpit, SOB, dizzy, edema, etc...    Hx Dysphagia> see below- not currently on meds; she denies abd pain, recurrent dysphagia, n/v, c/d, blood seen...    Hx IBS & +FamHx colon ca in mother> her last colonoscopy was 2009 & wnl; f/u suggested 14yrs w/ her family hx...    Hx HepC> see below- treated w/ Interferon in 2002-3 & HepC viral RNA disappeared from her system; LFTs are wnl...    GYN> followed by [name]- on Valtrex500- 1/2 daily for prevention of outbreaks...    DJD> on Tramadol, calcium, MVI, VitD2000; he CC is pain in her left thumb thenar area- seen by [facility] but no better; she notes Medrol dosepak helped thumb pain & breathing; we discussed Rx w/ heat, Tramadol,  Tylenol, & refer to Drsypher...    Anxiety, Depression> on Lorazepam1mg - taking 1-2Qhs and Zoloft100/d; still followed by [name]; under stress w/ parents ill...    Hx skin cancer> she has had several basal cells removed, icluding Moh's on upper lip 10/13 by DrAlbertini... We reviewed prob list, meds, xrays and labs> see below for updates >>           Problem List:    RHINITIS, ALLERGIC NOS (ICD-477.9) - uses ZYRTEK, Saline, FLONASE as needed... she notes sinus & "allergy" problems on & off... she wonders about what he sensitivities are & we discussed checking IgE (36) & Rast testing (pos to cat only)...  BRONCHITIS, RECURRENT (ICD-491.9) - exsmoker quit 1990's (20 yrs of up to  1.5ppd prior to that), uses Mucinex as needed ~  CXR 10/11 showed sl hyperinflation, mild incr markings & apical scarring, NAD... ~  1/12:  CXR report from [facility] sounds similar to our prev films (not avail for review) see above. ~  CXR 10/13 showed normal heart size, clear lungs, NAD...  MITRAL VALVE PROLAPSE (ICD-424.0) - she has MSC on exam... she notes rare palpit (self limited), & Echo 1986 w/ mild MVP. ~  2DEcho 11/08 showed norm LV w/ EF= 55-65% & no regional wall motion abn, mild thickening of MV w/ mild MR... ~  DrCDrenshaw treated her w/ BBlocker in the past for intermittent palpit & we will write for METOPROLOL 50mg  1/2 to 1 tab po prn...  ABNORMAL ELECTROCARDIOGRAM (ICD-794.31) - poor R wave progression V1-V3, no known CAD  Intermittent DYSPHAGIA >> rec to start PROTONIX 40mg  daily==> she is only taking it prn. ~  4/13:  She c/o intermittent dyspagia for solids & liqs that occurs 1-2 d per week; we will start PPI & refer to GI for EGD... ~  4/13:  Seen by [name] w/ EGD that was normal, Maloney54 dilator passed, rec to continue antireflux regimen, consider Ba Swallow...  Hx of HEPATITIS C (ICD-070.51) - Hx IV drug abuse as teen; Abn LFT's in late 80's/ early 90's; liver Bx 93 w/ chr persist hepatitis w/o necrosis (DrPatterson); serology pos for HepB antibodies, neg HBSAg, pos HepCAb; had pos HepC PCR 2002 (genotype 2B) & Rx w/ interferon 9/02-3/03 ([name]) w/ complete disappearance of HepC viral RNA; all subseq tests have shown - nl LFT's,  pos HepCAb, & neg HepC RNA Quant PCR. ~  labs 11/10 showed normal LFTs ~  Labs 1/12 showed LFTs are WNL.Marland Kitchen. ~  Labs 8/13 showed LFTs are WNL.Marland KitchenMarland Kitchen  IRRITABLE BOWEL SYNDROME (ICD-564.1) Family Hx of CANCER, COLORECTAL (ICD-154.0) - Mother w/ colon Ca age 16 (resected, no known recur); pt had normal colonoscopy 4/02, and f/u colonoscopy 9/09 was also neg- no polyps w/ f/u sched 29yrs...  HSV (ICD-054.9) - lab eval 5/08 by gyn [name])  DJD >>  Rec to take TRAMADOL 50mg  prn & use VOLTAREN Gel when needed... ~  4/13: she is c/o pain in left thumb MCP joint; rec hot soaks, meds above, refer to [name] per her request... ~  10/13: she saw [name] 9we don't have note to review) & she reports XRay showed "mild arthritis" & improved w/ dosepak ~  4/14: still c/o pain in left thumb area & rec to take Tramadol, Tylenol, & we will refer to DrSypher...  SYMPTOM, MEMORY LOSS (ICD-780.93) - new complaint 9/08 w/ essent nl MMSE, refer to neuro fo rfurther testing per pt request... seen by [name] who felt her memory disturbance, fatigue, & pain were  c/w Fibromyalgia> EEG was normal, MRI brain was normal, Sleep Study was relatively unremarkable, and Labs were neg including Lyme titer... Rx'd w/ Seroquel 50mg  Qhs, tried Provigil10mg /d, w/ consideration for use of Cymbalta or Adderall depending on response.  Hx of PARESTHESIA (ICD-782.0) - eval by neuro ([name]) nothing found... she uses TRAMADOL 50mg  Prn.  ANXIETY & DEPRESSION >> Rx by [name] on Lamictal prev- off now &  taking ZOLOFT 100mg /d & LORAZEPAM 0.5mg - taking 1.5 tabs at bedtime... ~  She wants to be sure that the chart reflects the fact that she does not have Bipolar illness (this Dx was entertained by [name] but she decided that [place] didn't have this)...  ANXIETY (ICD-300.00) - takes Lorazepam as noted... Under stress w/ parents illness...  SKIN CANCER, HX OF (ICD-V10.83) - several basal cell carcinomas removed in past... ~  10/13: she had basal cell removed from upper lip via Moh's by DrAlbertini...  Health Maintenance: ~  GI:  followed by [name]> colonoscopy 9/09 was neg- no polyps seen, f/u 5yrs. ~  GYN:  followed by [name] for PAP, Mammograms, BMD (?when done last & ?results)- Rx Calcium, MVI, Vit D). ~  Immunizations:  she declines the yearly seasonal Flu vaccine...   Past Surgical History  Procedure Laterality Date  . Rhinoplasty    . Tubal ligation       bilateral  . Appendectomy  11/08    Outpatient Encounter Prescriptions as of 01/11/2013  Medication Sig Dispense Refill  . Calcium Carbonate-Vit D-Min (CALCIUM 1200 PO) Take by mouth. Take 1 tab once daily.      . Cholecalciferol (D3 DOTS) 2000 UNITS TBDP Take by mouth. Take 1 tab twice daily.      . Diphenhyd-Hydrocort-Nystatin (FIRST-DUKES MOUTHWASH) SUSP Swish and swallow 1 teaspoon four times daily as needed for sore throat.  240 mL  1  . Diphenhydramine-APAP (PERCOGESIC) 12.5-325 MG TABS Take 1-2 tablets by mouth at bedtime.       . fluticasone (FLONASE) 50 MCG/ACT nasal spray Place 1 spray into the nose daily.  16 g  11  . HYDROcodone-homatropine (HYCODAN) 5-1.5 MG/5ML syrup Take 5 mLs by mouth every 6 (six) hours as needed for cough.  240 mL  1  . LORazepam (ATIVAN) 1 MG tablet Take 1-2 mg by mouth at bedtime. And as needed during the day      . metoprolol (LOPRESSOR) 50 MG tablet Take 1/2 to 1 tablet by mouth two times daily as directed  60 tablet  6  . Omega-3 Fatty Acids (FISH OIL) 1000 MG CAPS Take by mouth. Take 1 cap by mouth daily.      . sertraline (ZOLOFT) 100 MG tablet Take 100 mg by mouth at bedtime.      . traMADol (ULTRAM) 50 MG tablet TAKE ONE TABLET EVERY 6 HOURS AS NEEDED.  50 tablet  5  . valACYclovir (VALTREX) 500 MG tablet Take 1/2 tablet daily as needed for herpes simplex      . [DISCONTINUED] amoxicillin-clavulanate (AUGMENTIN) 875-125 MG per tablet Take 1 tablet by mouth 2 (two) times daily.  14 tablet  0  . [DISCONTINUED] methylPREDNISolone (MEDROL, PAK,) 4 MG tablet follow package directions  21 tablet  0  . [DISCONTINUED] predniSONE (STERAPRED UNI-PAK) 5 MG TABS Take as directed---please give a 6 day taper.  21 each  0   No facility-administered encounter medications on file as of 01/11/2013.    No Known Allergies   Current Medications, Allergies, Past Medical History, Past Surgical History,  Family History, and Social History were reviewed in [facility] record.   Review of Systems        See HPI - all other systems neg except as noted...       The patient complains of dyspnea on exertion.  The patient denies anorexia, fever, weight loss, weight gain, vision loss, decreased hearing, hoarseness, chest pain, syncope, peripheral edema, prolonged cough, headaches, hemoptysis, abdominal pain, melena, hematochezia, severe indigestion/heartburn, hematuria, incontinence, muscle weakness, suspicious skin lesions, transient blindness, difficulty walking, depression, unusual weight change, abnormal bleeding, enlarged lymph nodes, and angioedema.     Objective:   Physical Exam    WD, WN, 57 y/o WF in NAD... she is somewhat anxious. GENERAL:  Alert & oriented; pleasant & cooperative... HEENT:  [place]/AT, EOM-wnl, PERRLA, Fundi-benign, EACs-clear, TMs-wnl, NOSE-clear, THROAT-clear & wnl. NECK:  Supple w/ fairROM; no JVD; normal carotid impulses w/o bruits; no thyromegaly or nodules palpated; no lymphadenopathy. CHEST:  Clear to P & A; without wheezes/ rales/ or rhonchi. HEART:  Regular Rhythm; without murmurs/ rubs/ or gallops. ABDOMEN:  Soft & nontender; normal bowel sounds; no organomegaly or masses detected. EXT: without deformities or arthritic changes; no varicose veins/ venous insuffic/ or edema; +triggers... NEURO:  CN's intact; motor testing normal; sensory testing normal; gait normal & balance OK. DERM:  No lesions noted; no rash etc...  RADIOLOGY DATA:  Reviewed in the EPIC EMR & discussed w/ the patient...  LABORATORY DATA:  Reviewed in the EPIC EMR & discussed w/ the patient...   Assessment:      AR, Bronchits>  She was allergic to cats on RAST test 2012; she gets occas bronchitic infections requiring antibiotis...  MVP>  As noted- she gets occas palpit assoc w/ nerves etc;  She has METOPROLOL 50mg - 1/2=1 tab po bid as needed...  DYSPHAGIA>  Hx intermittent upper esoph/ throat symptoms w/ choking that occurs 1-2 per  week, now improved;  Neg EGD w/ dilatation 4/13 by DrBrodie, pt has stopped the Protonix on her own...  IBS, Constip, +FamHx colon ca>  She is up to date on screening & we reviewed Miralax, Senakot-S, etc....  HxHepC>  Treated 2002-3 w/ Inerferon & resolved in clinical remission & LFTs have remained normal...  DJD>  We discussed use of Voltaren Gel & Tramadol for any aches or pains; refer to DrSypher for left thumb discomfort...  Anxiety/ Depression>  Followed by [name] & stable on Zoloft & Lorazepam... Pt notes that she is NOT bipolar...     Plan:     Patient's Medications  New Prescriptions   No medications on file  Previous Medications   CALCIUM CARBONATE-VIT D-MIN (CALCIUM 1200 PO)    Take by mouth. Take 1 tab once daily.   CHOLECALCIFEROL (D3 DOTS) 2000 UNITS TBDP    Take by mouth. Take 1 tab twice daily.   DIPHENHYD-HYDROCORT-NYSTATIN (FIRST-DUKES MOUTHWASH) SUSP    Swish and swallow 1 teaspoon four times daily as needed for sore throat.   DIPHENHYDRAMINE-APAP (PERCOGESIC) 12.5-325 MG TABS    Take 1-2 tablets by mouth at bedtime.    HYDROCODONE-HOMATROPINE (HYCODAN) 5-1.5 MG/5ML SYRUP    Take 5 mLs by mouth every 6 (six) hours as needed for cough.   LORAZEPAM (ATIVAN) 1 MG TABLET    Take 1-2 mg by mouth at bedtime. And as needed during the day   METOPROLOL (LOPRESSOR) 50 MG TABLET    Take 1/2 to 1 tablet by mouth two times daily as directed   OMEGA-3  FATTY ACIDS (FISH OIL) 1000 MG CAPS    Take by mouth. Take 1 cap by mouth daily.   SERTRALINE (ZOLOFT) 100 MG TABLET    Take 100 mg by mouth at bedtime.   TRAMADOL (ULTRAM) 50 MG TABLET    TAKE ONE TABLET EVERY 6 HOURS AS NEEDED.   VALACYCLOVIR (VALTREX) 500 MG TABLET    Take 1/2 tablet daily as needed for herpes simplex  Modified Medications   Modified Medication Previous Medication   FLUTICASONE (FLONASE) 50 MCG/ACT NASAL SPRAY fluticasone (FLONASE) 50 MCG/ACT nasal spray      Place 2 sprays into the nose 2 (two) times daily.     Place 1 spray into the nose daily.  Discontinued Medications   AMOXICILLIN-CLAVULANATE (AUGMENTIN) 875-125 MG PER TABLET    Take 1 tablet by mouth 2 (two) times daily.   METHYLPREDNISOLONE (MEDROL, PAK,) 4 MG TABLET    follow package directions   PREDNISONE (STERAPRED UNI-PAK) 5 MG TABS    Take as directed---please give a 6 day taper.

## 2013-04-25 ENCOUNTER — Ambulatory Visit (INDEPENDENT_AMBULATORY_CARE_PROVIDER_SITE_OTHER): Payer: Federal, State, Local not specified - PPO | Admitting: Physician Assistant

## 2013-04-25 VITALS — BP 116/60 | HR 64 | Temp 98.2°F | Resp 18 | Ht 67.0 in | Wt 123.8 lb

## 2013-04-25 DIAGNOSIS — R05 Cough: Secondary | ICD-10-CM

## 2013-04-25 DIAGNOSIS — J329 Chronic sinusitis, unspecified: Secondary | ICD-10-CM

## 2013-04-25 DIAGNOSIS — R059 Cough, unspecified: Secondary | ICD-10-CM

## 2013-04-25 MED ORDER — HYDROCOD POLST-CHLORPHEN POLST 10-8 MG/5ML PO LQCR: 5.0000 mL | Freq: Two times a day (BID) | ORAL | Status: DC | PRN

## 2013-04-25 MED ORDER — AMOXICILLIN-POT CLAVULANATE 875-125 MG PO TABS: 1.0000 | ORAL_TABLET | Freq: Two times a day (BID) | ORAL | Status: DC

## 2013-04-25 NOTE — Progress Notes (Signed)
  Subjective:    Patient ID: Monique [name], female    DOB: 16-Sep-1956, 57 y.o.   MRN: [id number]  HPI 57 year old female presents with 4 day history of sinus pressure, nasal congestion, PND, fatigue, dry, hacking cough, and bilateral ear pressure. Admits to subjective fever and chills.  Denies nausea, vomiting, otalgia, sore throat, headache, or dizziness.  Does use Flonase and zyrtec regularly. Has also been taking Mucinex for the past 2 days.   Patient is otherwise doing well with no other concerns today.     Review of Systems  Constitutional: Positive for fever (subjective) and chills.  HENT: Positive for congestion, rhinorrhea, postnasal drip and sinus pressure. Negative for ear pain, sore throat and ear discharge.   Respiratory: Positive for cough. Negative for shortness of breath and wheezing.   Gastrointestinal: Negative for nausea, vomiting and abdominal pain.  Neurological: Negative for dizziness and headaches.       Objective:   Physical Exam  Constitutional: She is oriented to person, place, and time. She appears well-developed and well-nourished.  HENT:  Head: Normocephalic and atraumatic.  Right Ear: Hearing, tympanic membrane, external ear and ear canal normal.  Left Ear: Hearing, tympanic membrane, external ear and ear canal normal.  Mouth/Throat: Uvula is midline, oropharynx is clear and moist and mucous membranes are normal. No oropharyngeal exudate (clear PND) or posterior oropharyngeal erythema.  Eyes: Conjunctivae are normal.  Neck: Normal range of motion. Neck supple.  Cardiovascular: Normal rate and regular rhythm.   Pulmonary/Chest: Effort normal and breath sounds normal.  Lymphadenopathy:    She has no cervical adenopathy.  Neurological: She is alert and oriented to person, place, and time.  Psychiatric: She has a normal mood and affect. Her behavior is normal. Judgment and thought content normal.          Assessment & Plan:  Sinusitis - Plan:  amoxicillin-clavulanate (AUGMENTIN) 875-125 MG per tablet  Cough - Plan: chlorpheniramine-HYDROcodone (TUSSIONEX PENNKINETIC ER) 10-8 MG/5ML LQCR  Start Augmentin 875 mg bid x 10 days Tussionex q12hours prn cough Increase fluids and rest Continue Mucinex, Zyrtec, and Flonase Follow up if symptoms worsen or fail to improve.

## 2013-04-25 NOTE — Progress Notes (Deleted)
Patient here for TB read that was placed at another facility. He presents with appropriate paperwork and he is within his window. Read as negative at 0.00 mm. Not seen by a provider  

## 2013-05-03 ENCOUNTER — Encounter: Payer: Self-pay | Admitting: *Deleted

## 2013-05-10 ENCOUNTER — Encounter: Payer: Self-pay | Admitting: Internal Medicine

## 2013-06-09 ENCOUNTER — Encounter: Payer: Self-pay | Admitting: Internal Medicine

## 2013-07-19 ENCOUNTER — Ambulatory Visit (INDEPENDENT_AMBULATORY_CARE_PROVIDER_SITE_OTHER): Payer: Federal, State, Local not specified - PPO | Admitting: Pulmonary Disease

## 2013-07-19 ENCOUNTER — Encounter: Payer: Self-pay | Admitting: Pulmonary Disease

## 2013-07-19 VITALS — BP 130/82 | HR 61 | Temp 97.7°F | Ht 67.0 in | Wt 130.8 lb

## 2013-07-19 DIAGNOSIS — I059 Rheumatic mitral valve disease, unspecified: Secondary | ICD-10-CM

## 2013-07-19 DIAGNOSIS — Z23 Encounter for immunization: Secondary | ICD-10-CM

## 2013-07-19 DIAGNOSIS — F32A Depression, unspecified: Secondary | ICD-10-CM

## 2013-07-19 DIAGNOSIS — E7800 Pure hypercholesterolemia, unspecified: Secondary | ICD-10-CM

## 2013-07-19 DIAGNOSIS — E559 Vitamin D deficiency, unspecified: Secondary | ICD-10-CM

## 2013-07-19 DIAGNOSIS — B1710 Acute hepatitis C without hepatic coma: Secondary | ICD-10-CM

## 2013-07-19 DIAGNOSIS — K589 Irritable bowel syndrome without diarrhea: Secondary | ICD-10-CM

## 2013-07-19 DIAGNOSIS — J309 Allergic rhinitis, unspecified: Secondary | ICD-10-CM

## 2013-07-19 DIAGNOSIS — M549 Dorsalgia, unspecified: Secondary | ICD-10-CM

## 2013-07-19 DIAGNOSIS — F411 Generalized anxiety disorder: Secondary | ICD-10-CM

## 2013-07-19 DIAGNOSIS — J42 Unspecified chronic bronchitis: Secondary | ICD-10-CM

## 2013-07-19 DIAGNOSIS — M1990 Unspecified osteoarthritis, unspecified site: Secondary | ICD-10-CM

## 2013-07-19 DIAGNOSIS — R1310 Dysphagia, unspecified: Secondary | ICD-10-CM

## 2013-07-19 DIAGNOSIS — F329 Major depressive disorder, single episode, unspecified: Secondary | ICD-10-CM

## 2013-07-19 DIAGNOSIS — Z85828 Personal history of other malignant neoplasm of skin: Secondary | ICD-10-CM

## 2013-07-19 MED ORDER — METOPROLOL TARTRATE 50 MG PO TABS: ORAL_TABLET | ORAL | Status: DC

## 2013-07-19 MED ORDER — FLUTICASONE PROPIONATE 50 MCG/ACT NA SUSP: 2.0000 | Freq: Two times a day (BID) | NASAL | Status: DC

## 2013-07-19 NOTE — Progress Notes (Signed)
Subjective:     Patient ID: Winn Jock, female   DOB: 1956/07/23, 57 y.o.   MRN: [id number]  HPI 57 y/o WF here for a follow up visit... he has multiple medical problems as noted below...  ~  October 15, 2010:  she went to [facility] 10/02/10 w/ URI "I couldn't talk" & had CXR showing biapical pleuroparenchymal scarring & rather than checking old films in the [facility] sys they scared the bejesus out of her thinking she had pneumonia, lung ca, or worse... we did CXR 10/11 w/ no ch in this area over a 60yr period & she is reassured... incidently- no CP, cough, sputum, f/ c/ s, etc.  ~  January 01, 2012:  78mo ROV & Monalisa recently saw TP w/ URI, congestion, drainage, etc; treated w/ Zyrtek, Mucinex, & Tylenol & did well;  She is under increased stress caring for her elderly parents (mother is [name]);  Her CC today is "my thumb" c/o discomfort on left thumb at MCP joint> rec hot soaks, apply splint, anti-inflamm med rx (Voltaren Gel, Tramadol) & she wants referral to [name]- ok...  She also c/o intermittent dysphagia & states that even water will choke her on occas (occurs ?2d per week)> we discuused Rx w/ Protonix taken 30 min before a meal & f/u w/ [name] for further eval... Finally she notes additional palpitations on occas & was prev on BBlocker from [name]> she knows to avoid caffeine, etc;  Rx w/ Metoprolol 50mg - 1/2 to 1 tab as needed...  ~  July 13, 2012:  82mo ROV & [name] is doing well overall- just had skin cancer removed from upper lip via Moh's;  She notes some recurrent pain at left thumb & wrist (saw [name] w/ XRays showing mild arthritis per pt <we don't have notes from him> & treated w/ Dosepak & Tramadol; also c/o some left ribcage discomfort & exam shows some tenderness, but clear lungs...    We reviewed prob list, meds, xrays and labs> see below for updates >> OK Flu shot today... LABS 8/13:  FLP- wnl on diet alone;  Chems- wnl;  CBC- wnl;  TSH=2.53 CXR 10/13 showed normal  heart size, clear lungs, NAD.Marland Kitchen.   ~  January 11, 2013:  82mo ROV & [name] indicates that she has been working for [employer] helping w/ her parents... Medically she is stable w/o new complaints or concerns except for discomfort in her left thumb (see below)... We reviewed the following medical problems during today's office visit >>     AR, Recurrent bronchitis> on Zyrtek, Saline, Flonase (refilled); recently treated w/ Augmentin/ Dosepak/ Hycodan & resolved back to baseline...    MVP, palpit> on Metop50- 1/2 to 1 tab prn palpit; she denies CP, recent palpit, SOB, dizzy, edema, etc...    Hx Dysphagia> see below- not currently on meds; she denies abd pain, recurrent dysphagia, n/v, c/d, blood seen...    Hx IBS & +FamHx colon ca in mother> her last colonoscopy was 2009 & wnl; f/u suggested 44yrs w/ her family hx...    Hx HepC> see below- treated w/ Interferon in 2002-3 & HepC viral RNA disappeared from her system; LFTs are wnl...    GYN> followed by [name]- on Valtrex500- 1/2 daily for prevention of outbreaks...    DJD> on Tramadol, calcium, MVI, VitD2000; he CC is pain in her left thumb thenar area- seen by [facility] but no better; she notes Medrol dosepak helped thumb pain & breathing; we discussed Rx w/ heat, Tramadol,  Tylenol, & refer to Drsypher...    Anxiety, Depression> on Lorazepam1mg - taking 1-2Qhs and Zoloft100/d; still followed by [name]; under stress w/ parents ill...    Hx skin cancer> she has had several basal cells removed, icluding Moh's on upper lip 10/13 by DrAlbertini... We reviewed prob list, meds, xrays and labs> see below for updates >>   ~  July 19, 2013:  36mo ROV & Lesleyanne is stable- "just feel tired" w/ lots of stress caring for mother, etc... We reviewed the following medical problems during today's office visit >>     AR, Recurrent bronchitis> on Zyrtek, Saline, Flonase, & MMW; prev treated w/ Augmentin/ Tussionex per [facility] 7/14 & improved...     MVP, palpit> on Metop50- 1/2 to  1 tab prn palpit; she states that 1/2 tab daily works well & denies CP, recent palpit, SOB, dizzy, edema, etc...    Hx Dysphagia> see below- not currently on meds; she denies abd pain, recurrent dysphagia, n/v, c/d, blood seen...    Hx IBS & +FamHx colon ca in mother> her last colonoscopy was 2009 & wnl; f/u suggested 32yrs w/ her family hx & she will set this up.    Hx HepC> see below- treated w/ Interferon in 2002-3 & HepC viral RNA disappeared from her system; LFTs are wnl...    GYN> followed by [name]- on Valtrex500- 1/2 daily for prevention of outbreaks...    DJD> on Tramadol, calcium, MVI, VitD2000; he CC is pain in her left thumb thenar area- seen by [facility] but no better; she notes Medrol dosepak helped thumb pain & breathing; we discussed Rx w/ heat, Tramadol, Aleve/Tylenol; splint per DrSypher.    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    Hx skin cancer> she has had several basal cells removed, icluding Moh's on upper lip 10/13 by DrAlbertini... We reviewed prob list, meds, xrays and labs> see below for updates >> OK 2014 flu shot today...  LABS 10/14:  FLP- at goals on diet alone;  Chems- wnl;  CBC- wnl;  TSH=2.13;  VitD=50...           Problem List:    RHINITIS, ALLERGIC NOS (ICD-477.9) - uses ZYRTEK, Saline, FLONASE as needed... she notes sinus & "allergy" problems on & off... she wonders about what he sensitivities are & we discussed checking IgE (36) & Rast testing (pos to cat only)...  BRONCHITIS, RECURRENT (ICD-491.9) - exsmoker quit 1990's (20 yrs of up to 1.5ppd prior to that), uses Mucinex as needed ~  CXR 10/11 showed sl hyperinflation, mild incr markings & apical scarring, NAD... ~  1/12:  CXR report from [facility] sounds similar to our prev films (not avail for review) see above. ~  CXR 10/13 showed normal heart size, clear lungs, NAD...  MITRAL VALVE PROLAPSE  (ICD-424.0) - she has MSC on exam... she notes rare palpit (self limited), & Echo 1986 w/ mild MVP. ~  2DEcho 11/08 showed norm LV w/ EF= 55-65% & no regional wall motion abn, mild thickening of MV w/ mild MR... ~  DrCDrenshaw treated her w/ BBlocker in the past for intermittent palpit & we will write for METOPROLOL 50mg  1/2 to 1 tab po prn... ~  10/14: she reports that Metoprolol50- 1/2 tab daily works well & she denies CP, palpit, SOB, etc...  ABNORMAL ELECTROCARDIOGRAM (ICD-794.31) - poor R wave progression V1-V3, no known CAD  Intermittent DYSPHAGIA >> rec to start  PROTONIX 40mg  daily==> she is only taking it prn. ~  4/13:  She c/o intermittent dyspagia for solids & liqs that occurs 1-2 d per week; we will start PPI & refer to GI for EGD... ~  4/13:  Seen by [name] w/ EGD that was normal, Maloney54 dilator passed, rec to continue antireflux regimen, consider Ba Swallow...  IRRITABLE BOWEL SYNDROME (ICD-564.1) Family Hx of CANCER, COLORECTAL (ICD-154.0) - Mother w/ colon Ca age 75 (resected, no known recur); pt had normal colonoscopy 4/02, and f/u colonoscopy 9/09 was also neg- no polyps w/ f/u sched 74yrs... ~  10/14:  She is due for f/u colonoscopy & will set this up w/ [name]...  Hx of HEPATITIS C (ICD-070.51) - Hx IV drug abuse as teen; Abn LFT's in late 80's/ early 90's; liver Bx 93 w/ chr persist hepatitis w/o necrosis (DrPatterson); serology pos for HepB antibodies, neg HBSAg, pos HepCAb; had pos HepC PCR 2002 (genotype 2B) & Rx w/ interferon 9/02-3/03 ([name]) w/ complete disappearance of HepC viral RNA; all subseq tests have shown - nl LFT's,  pos HepCAb, & neg HepC RNA Quant PCR. ~  labs 11/10 showed normal LFTs ~  Labs 1/12 showed LFTs are WNL.Marland Kitchen. ~  Labs 8/13 showed LFTs are WNL.Marland Kitchen. ~  LFTs remain WNL.Marland KitchenMarland Kitchen  HSV (ICD-054.9) - lab eval 5/08 by gyn [name]) & she has Valtrex from her...  DJD >> Rec to take TRAMADOL 50mg  prn & use VOLTAREN Gel when needed... ~  4/13: she is  c/o pain in left thumb MCP joint; rec hot soaks, meds above, refer to [name] per her request... ~  10/13: she saw [name] 9we don't have note to review) & she reports XRay showed "mild arthritis" & improved w/ dosepak ~  4/14: still c/o pain in left thumb area & rec to take Tramadol, Tylenol, & we will refer to DrSypher=> seen & dx w/ CMC arthrosis, given splint/ biofreeze, aleve, & consider shot if not responding...  SYMPTOM, MEMORY LOSS (ICD-780.93) - new complaint 9/08 w/ essent nl MMSE, refer to neuro fo rfurther testing per pt request... seen by [name] who felt her memory disturbance, fatigue, & pain were c/w Fibromyalgia> EEG was normal, MRI brain was normal, Sleep Study was relatively unremarkable, and Labs were neg including Lyme titer... Rx'd w/ Seroquel 50mg  Qhs, tried Provigil10mg /d, w/ consideration for use of Cymbalta or Adderall depending on response.  Hx of PARESTHESIA (ICD-782.0) - eval by neuro ([name]) nothing found... she uses TRAMADOL 50mg  Prn.  ANXIETY & DEPRESSION >> Rx by [name] on Lamictal prev- off now &  taking ZOLOFT 100mg /d & LORAZEPAM 0.5mg - taking 1.5 tabs at bedtime... ~  She wants to be sure that the chart reflects the fact that she does not have Bipolar illness (this Dx was entertained by [name] but she decided that [place] didn't have this)...  ANXIETY (ICD-300.00) - takes Lorazepam as noted... Under stress w/ parents illness...  SKIN CANCER, HX OF (ICD-V10.83) - several basal cell carcinomas removed in past... ~  10/13: she had basal cell removed from upper lip via Moh's by DrAlbertini...  Health Maintenance: ~  GI:  followed by [name]> colonoscopy 9/09 was neg- no polyps seen, f/u 38yrs. ~  GYN:  followed by [name] for PAP, Mammograms, BMD (?when done last & ?results)- Rx Calcium, MVI, Vit D). ~  Immunizations:  she declines the yearly seasonal Flu vaccine...   Past Surgical History  Procedure Laterality Date  . Rhinoplasty    . Tubal  ligation  bilateral  . Appendectomy  11/08    Outpatient Encounter Prescriptions as of 07/19/2013  Medication Sig Dispense Refill  . b complex vitamins tablet Take 1 tablet by mouth daily.      . Calcium Carbonate-Vit D-Min (CALCIUM 1200 PO) Take by mouth. Take 1 tab once daily.      . Cholecalciferol (VITAMIN D3) 1000 UNITS CHEW Chew 1 tablet by mouth 2 (two) times daily.      . Diphenhyd-Hydrocort-Nystatin (FIRST-DUKES MOUTHWASH) SUSP Swish and swallow 1 teaspoon four times daily as needed for sore throat.  240 mL  1  . Diphenhydramine-APAP (PERCOGESIC) 12.5-325 MG TABS Take 1-2 tablets by mouth at bedtime.       . fluticasone (FLONASE) 50 MCG/ACT nasal spray Place 2 sprays into the nose 2 (two) times daily.  16 g  11  . LORazepam (ATIVAN) 1 MG tablet Take 1-2 mg by mouth at bedtime. And as needed during the day      . metoprolol (LOPRESSOR) 50 MG tablet Take 1/2 to 1 tablet by mouth two times daily as directed  60 tablet  6  . Omega-3 Fatty Acids (FISH OIL) 1000 MG CAPS Take by mouth. Take 1 cap by mouth daily.      . sertraline (ZOLOFT) 100 MG tablet Take 100 mg by mouth at bedtime.      . traMADol (ULTRAM) 50 MG tablet TAKE ONE TABLET EVERY 6 HOURS AS NEEDED.  50 tablet  5  . valACYclovir (VALTREX) 500 MG tablet Take 1/2 tablet daily  for herpes simplex      . [DISCONTINUED] amoxicillin-clavulanate (AUGMENTIN) 875-125 MG per tablet Take 1 tablet by mouth 2 (two) times daily.  20 tablet  0  . [DISCONTINUED] chlorpheniramine-HYDROcodone (TUSSIONEX PENNKINETIC ER) 10-8 MG/5ML LQCR Take 5 mLs by mouth every 12 (twelve) hours as needed (cough).  60 mL  0  . [DISCONTINUED] Cholecalciferol (D3 DOTS) 2000 UNITS TBDP Take by mouth. Take 1 tab twice daily.      . [DISCONTINUED] HYDROcodone-homatropine (HYCODAN) 5-1.5 MG/5ML syrup Take 5 mLs by mouth every 6 (six) hours as needed for cough.  240 mL  1   No facility-administered encounter medications on file as of 07/19/2013.    No Known  Allergies   Current Medications, Allergies, Past Medical History, Past Surgical History, Family History, and Social History were reviewed in [employer] record.   Review of Systems        See HPI - all other systems neg except as noted...       The patient complains of dyspnea on exertion.  The patient denies anorexia, fever, weight loss, weight gain, vision loss, decreased hearing, hoarseness, chest pain, syncope, peripheral edema, prolonged cough, headaches, hemoptysis, abdominal pain, melena, hematochezia, severe indigestion/heartburn, hematuria, incontinence, muscle weakness, suspicious skin lesions, transient blindness, difficulty walking, depression, unusual weight change, abnormal bleeding, enlarged lymph nodes, and angioedema.     Objective:   Physical Exam    WD, WN, 57 y/o WF in NAD... she is somewhat anxious. GENERAL:  Alert & oriented; pleasant & cooperative... HEENT:  [place]/AT, EOM-wnl, PERRLA, Fundi-benign, EACs-clear, TMs-wnl, NOSE-clear, THROAT-clear & wnl. NECK:  Supple w/ fairROM; no JVD; normal carotid impulses w/o bruits; no thyromegaly or nodules palpated; no lymphadenopathy. CHEST:  Clear to P & A; without wheezes/ rales/ or rhonchi. HEART:  Regular Rhythm; without murmurs/ rubs/ or gallops. ABDOMEN:  Soft & nontender; normal bowel sounds; no organomegaly or masses detected. EXT: without deformities or arthritic changes; no  varicose veins/ venous insuffic/ or edema; +triggers... NEURO:  CN's intact; motor testing normal; sensory testing normal; gait normal & balance OK. DERM:  No lesions noted; no rash etc...  RADIOLOGY DATA:  Reviewed in the EPIC EMR & discussed w/ the patient...  LABORATORY DATA:  Reviewed in the EPIC EMR & discussed w/ the patient...   Assessment:      AR, Bronchits>  She was allergic to cats on RAST test 2012; she gets occas bronchitic infections requiring antibiotis...  MVP>  As noted- she gets occas palpit assoc w/  nerves etc;  She has METOPROLOL 50mg - 1/2=1 tab po bid as needed...  DYSPHAGIA>  Hx intermittent upper esoph/ throat symptoms w/ choking that occurs 1-2 per week, now improved;  Neg EGD w/ dilatation 4/13 by DrBrodie, pt has stopped the Protonix on her own...  IBS, Constip, +FamHx colon ca>  She is due for colonoscopy screening & we reviewed Miralax, Senakot-S, etc....  HxHepC>  Treated 2002-3 w/ Inerferon & resolved in clinical remission & LFTs have remained normal...  DJD>  We discussed use of Voltaren Gel & Tramadol for any aches or pains; refer to DrSypher for left thumb discomfort...  Anxiety/ Depression>  Followed by [name] & stable on Zoloft & Lorazepam... Pt notes that she is NOT bipolar...     Plan:     Patient's Medications  New Prescriptions   No medications on file  Previous Medications   B COMPLEX VITAMINS TABLET    Take 1 tablet by mouth daily.   CALCIUM CARBONATE-VIT D-MIN (CALCIUM 1200 PO)    Take by mouth. Take 1 tab once daily.   CHOLECALCIFEROL (VITAMIN D3) 1000 UNITS CHEW    Chew 1 tablet by mouth 2 (two) times daily.   DIPHENHYD-HYDROCORT-NYSTATIN (FIRST-DUKES MOUTHWASH) SUSP    Swish and swallow 1 teaspoon four times daily as needed for sore throat.   DIPHENHYDRAMINE-APAP (PERCOGESIC) 12.5-325 MG TABS    Take 1-2 tablets by mouth at bedtime.    LORAZEPAM (ATIVAN) 1 MG TABLET    Take 1-2 mg by mouth at bedtime. And as needed during the day   OMEGA-3 FATTY ACIDS (FISH OIL) 1000 MG CAPS    Take by mouth. Take 1 cap by mouth daily.   SERTRALINE (ZOLOFT) 100 MG TABLET    Take 100 mg by mouth at bedtime.   TRAMADOL (ULTRAM) 50 MG TABLET    TAKE ONE TABLET EVERY 6 HOURS AS NEEDED.   VALACYCLOVIR (VALTREX) 500 MG TABLET    Take 1/2 tablet daily  for herpes simplex  Modified Medications   Modified Medication Previous Medication   FLUTICASONE (FLONASE) 50 MCG/ACT NASAL SPRAY fluticasone (FLONASE) 50 MCG/ACT nasal spray      Place 2 sprays into the nose 2 (two) times  daily.    Place 2 sprays into the nose 2 (two) times daily.   METOPROLOL (LOPRESSOR) 50 MG TABLET metoprolol (LOPRESSOR) 50 MG tablet      Take 1/2 to 1 tablet by mouth two times daily as directed    Take 1/2 to 1 tablet by mouth two times daily as directed  Discontinued Medications   AMOXICILLIN-CLAVULANATE (AUGMENTIN) 875-125 MG PER TABLET    Take 1 tablet by mouth 2 (two) times daily.   CHLORPHENIRAMINE-HYDROCODONE (TUSSIONEX PENNKINETIC ER) 10-8 MG/5ML LQCR    Take 5 mLs by mouth every 12 (twelve) hours as needed (cough).   CHOLECALCIFEROL (D3 DOTS) 2000 UNITS TBDP    Take by mouth. Take 1 tab twice daily.  HYDROCODONE-HOMATROPINE (HYCODAN) 5-1.5 MG/5ML SYRUP    Take 5 mLs by mouth every 6 (six) hours as needed for cough.

## 2013-07-19 NOTE — Patient Instructions (Signed)
Today we updated your med list in our EPIC system...    Continue your current medications the same...    We refilled the meds you requested...  Please return to our lab one morning this week for your FASTING blood work...    We will contact you w/ the results when available...   Get plenty of rest & take care of Monique [name]...  Call for any questions...  Let's plan a follow up visit in 59mo, sooner if needed for problems.Marland KitchenMarland Kitchen

## 2013-07-29 ENCOUNTER — Other Ambulatory Visit (INDEPENDENT_AMBULATORY_CARE_PROVIDER_SITE_OTHER): Payer: Federal, State, Local not specified - PPO

## 2013-07-29 DIAGNOSIS — E559 Vitamin D deficiency, unspecified: Secondary | ICD-10-CM

## 2013-07-29 DIAGNOSIS — E7800 Pure hypercholesterolemia, unspecified: Secondary | ICD-10-CM

## 2013-07-29 DIAGNOSIS — F411 Generalized anxiety disorder: Secondary | ICD-10-CM

## 2013-07-29 DIAGNOSIS — B1710 Acute hepatitis C without hepatic coma: Secondary | ICD-10-CM

## 2013-07-29 DIAGNOSIS — K589 Irritable bowel syndrome without diarrhea: Secondary | ICD-10-CM

## 2013-07-29 DIAGNOSIS — R1310 Dysphagia, unspecified: Secondary | ICD-10-CM

## 2013-07-29 LAB — CBC WITH DIFFERENTIAL/PLATELET
Basophils Absolute: 0 10*3/uL (ref 0.0–0.1)
Hemoglobin: 13 g/dL (ref 12.0–15.0)
Lymphocytes Relative: 29.7 % (ref 12.0–46.0)
Monocytes Relative: 8.5 % (ref 3.0–12.0)
Neutro Abs: 3.4 10*3/uL (ref 1.4–7.7)
Platelets: 221 10*3/uL (ref 150.0–400.0)
RDW: 13.7 % (ref 11.5–14.6)

## 2013-07-29 LAB — TSH: TSH: 2.13 u[IU]/mL (ref 0.35–5.50)

## 2013-07-29 LAB — BASIC METABOLIC PANEL
CO2: 29 mEq/L (ref 19–32)
Calcium: 9.5 mg/dL (ref 8.4–10.5)
Glucose, Bld: 91 mg/dL (ref 70–99)
Sodium: 143 mEq/L (ref 135–145)

## 2013-07-29 LAB — LIPID PANEL
Cholesterol: 176 mg/dL (ref 0–200)
HDL: 74.6 mg/dL (ref >39.00)
Triglycerides: 68 mg/dL (ref 0.0–149.0)

## 2013-07-29 LAB — HEPATIC FUNCTION PANEL
Albumin: 4.2 g/dL (ref 3.5–5.2)
Alkaline Phosphatase: 74 U/L (ref 39–117)
Total Protein: 7.4 g/dL (ref 6.0–8.3)

## 2013-07-30 LAB — VITAMIN D 25 HYDROXY (VIT D DEFICIENCY, FRACTURES): Vit D, 25-Hydroxy: 50 ng/mL (ref 30–89)

## 2013-08-03 ENCOUNTER — Ambulatory Visit (AMBULATORY_SURGERY_CENTER): Payer: Self-pay | Admitting: *Deleted

## 2013-08-03 VITALS — Ht 67.0 in | Wt 129.2 lb

## 2013-08-03 DIAGNOSIS — Z800 Family history of malignant neoplasm of digestive organs: Secondary | ICD-10-CM

## 2013-08-03 MED ORDER — MOVIPREP 100 G PO SOLR: ORAL | Status: DC

## 2013-08-03 NOTE — Progress Notes (Signed)
No allergies to eggs or soy. No problems with anesthesia.  

## 2013-08-05 ENCOUNTER — Encounter: Payer: Self-pay | Admitting: Internal Medicine

## 2013-08-11 ENCOUNTER — Other Ambulatory Visit (HOSPITAL_COMMUNITY): Payer: Self-pay | Admitting: Obstetrics & Gynecology

## 2013-08-17 ENCOUNTER — Other Ambulatory Visit (HOSPITAL_COMMUNITY): Payer: Self-pay | Admitting: Obstetrics & Gynecology

## 2013-08-17 ENCOUNTER — Encounter: Payer: Self-pay | Admitting: Internal Medicine

## 2013-08-17 ENCOUNTER — Ambulatory Visit (AMBULATORY_SURGERY_CENTER): Payer: Federal, State, Local not specified - PPO | Admitting: Internal Medicine

## 2013-08-17 VITALS — BP 103/57 | HR 59 | Temp 97.7°F | Resp 16 | Ht 67.0 in | Wt 129.0 lb

## 2013-08-17 DIAGNOSIS — Z1211 Encounter for screening for malignant neoplasm of colon: Secondary | ICD-10-CM

## 2013-08-17 DIAGNOSIS — Z800 Family history of malignant neoplasm of digestive organs: Secondary | ICD-10-CM

## 2013-08-17 DIAGNOSIS — M899 Disorder of bone, unspecified: Secondary | ICD-10-CM

## 2013-08-17 DIAGNOSIS — Z1231 Encounter for screening mammogram for malignant neoplasm of breast: Secondary | ICD-10-CM

## 2013-08-17 DIAGNOSIS — N951 Menopausal and female climacteric states: Secondary | ICD-10-CM

## 2013-08-17 MED ORDER — SODIUM CHLORIDE 0.9 % IV SOLN: 500.0000 mL | INTRAVENOUS | Status: DC

## 2013-08-17 NOTE — Patient Instructions (Signed)

## 2013-08-17 NOTE — Op Note (Signed)
[facility] [address] [place], [address]   COLONOSCOPY PROCEDURE REPORT  PATIENT: Monique [name], Monique [name]  MR#: [id number] BIRTHDATE: 12-Dec-1955 , 56  yrs. old GENDER: Female ENDOSCOPIST: Hart Carwin, MD REFERRED XB:JYNWG Kriste Basque, M.D. PROCEDURE DATE:  08/17/2013 PROCEDURE:   Colonoscopy, screening First Screening Colonoscopy - Avg.  risk and is 50 yrs.  old or older - No.  Prior Negative Screening - Now for repeat screening. Above average risk  History of Adenoma - Now for follow-up colonoscopy & has been > or = to 3 yrs.  N/A  Polyps Removed Today? No.  Recommend repeat exam, <10 yrs? Yes.  High risk (family or personal hx). ASA CLASS:   Class II INDICATIONS:parent with colon cancer.  Last colonoscopy was in 2002 and in September 2009. MEDICATIONS: MAC sedation, administered by CRNA and propofol (Diprivan) 200mg  IV  DESCRIPTION OF PROCEDURE:   After the risks benefits and alternatives of the procedure were thoroughly explained, informed consent was obtained.  A digital rectal exam revealed no abnormalities of the rectum.   The LB [id number]  endoscope was introduced through the anus and advanced to the cecum, which was identified by both the appendix and ileocecal valve. No adverse events experienced.   The quality of the prep was Prepopik excellent  The instrument was then slowly withdrawn as the colon was fully examined.      COLON FINDINGS: A normal appearing cecum, ileocecal valve, and appendiceal orifice were identified.  The ascending, hepatic flexure, transverse, splenic flexure, descending, sigmoid colon and rectum appeared unremarkable.  No polyps or cancers were seen. Retroflexed views revealed no abnormalities. The time to cecum=4 minutes 51 seconds.  Withdrawal time=6 minutes 45 seconds.  The scope was withdrawn and the procedure completed. COMPLICATIONS: There were no complications.  ENDOSCOPIC IMPRESSION: Normal  colon  RECOMMENDATIONS: 1.  High fiber diet 2.   recall colonoscopy in 5 years   eSigned:  Hart Carwin, MD 08/17/2013 12:17 PM   cc:

## 2013-08-17 NOTE — Progress Notes (Signed)
Patient did not experience any of the following events: a burn prior to discharge; a fall within the facility; wrong site/side/patient/procedure/implant event; or a hospital transfer or hospital admission upon discharge from the facility. (G8907) Patient did not have preoperative order for IV antibiotic SSI prophylaxis. ([id number])  

## 2013-08-18 ENCOUNTER — Telehealth: Payer: Self-pay | Admitting: *Deleted

## 2013-08-18 NOTE — Telephone Encounter (Signed)
Number identifier, left message, follow-up  

## 2013-08-19 ENCOUNTER — Other Ambulatory Visit: Payer: Self-pay | Admitting: *Deleted

## 2013-08-19 MED ORDER — TRAMADOL HCL 50 MG PO TABS: ORAL_TABLET | ORAL | Status: DC

## 2013-08-23 ENCOUNTER — Telehealth: Payer: Self-pay | Admitting: Pulmonary Disease

## 2013-08-23 NOTE — Telephone Encounter (Signed)
Pt was last seen by SN on 07/19/2013 and all labs were WNL at this last visit.  Per SN---pt will need to follow up with ortho for further eval.  thanks

## 2013-08-23 NOTE — Telephone Encounter (Signed)
I called and spoke with pt. She reports she has been having cramps in her feet at night. It is located where the foot bends on top and both feet does this at the same time. She reports this has been going a couple other times but was happening once every few months. Her feet kind of feel like they are "contracting". Pt thinks maybe she might have a vitamin deficiency or not sure what is going on. She is concerned their may be a underlying issue going on. Please advise SN thanks

## 2013-08-23 NOTE — Telephone Encounter (Signed)
Pt advised. Welford Christmas, CMA  

## 2013-08-27 IMAGING — CR DG CHEST 2V
2 series · 2 of 2 positions shown · non-contrast
Comparison: 07/02/2010

CLINICAL DATA: Left-sided chest pain

CHEST - 2 VIEW

[view not recorded (1 of 2)]
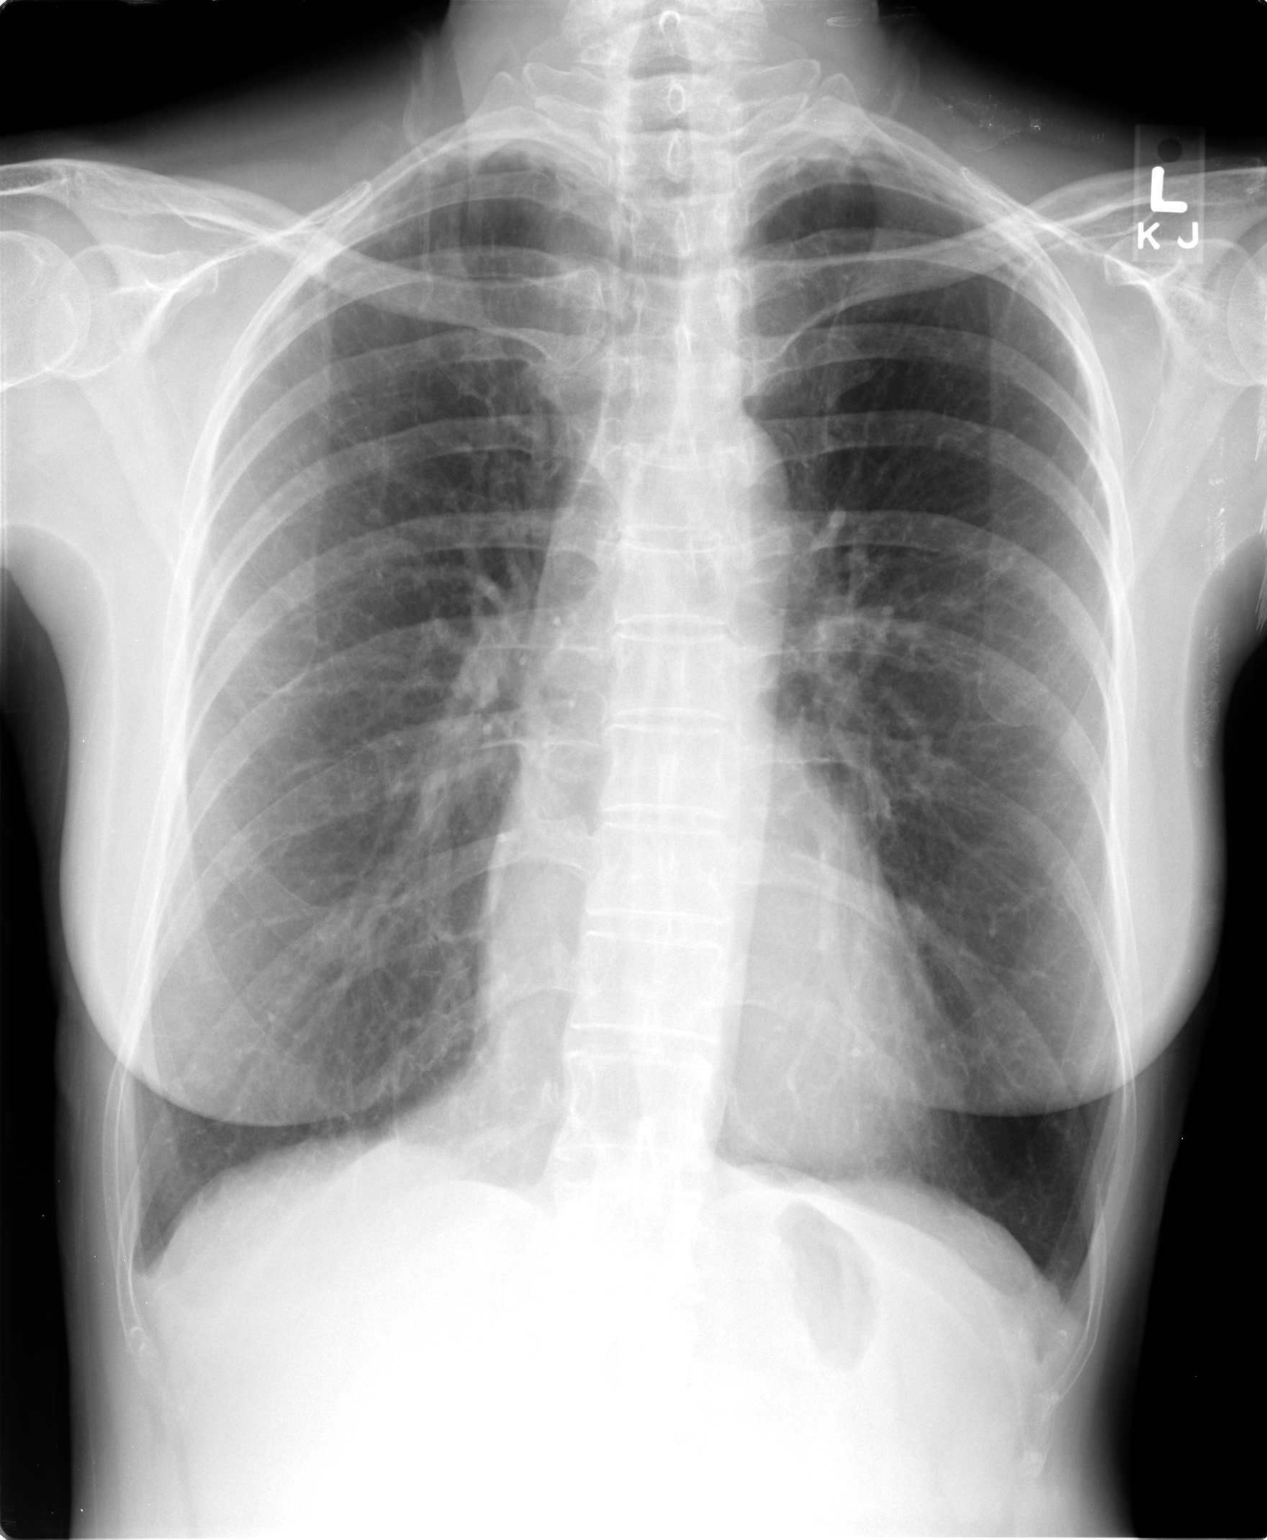

[view not recorded (2 of 2)]
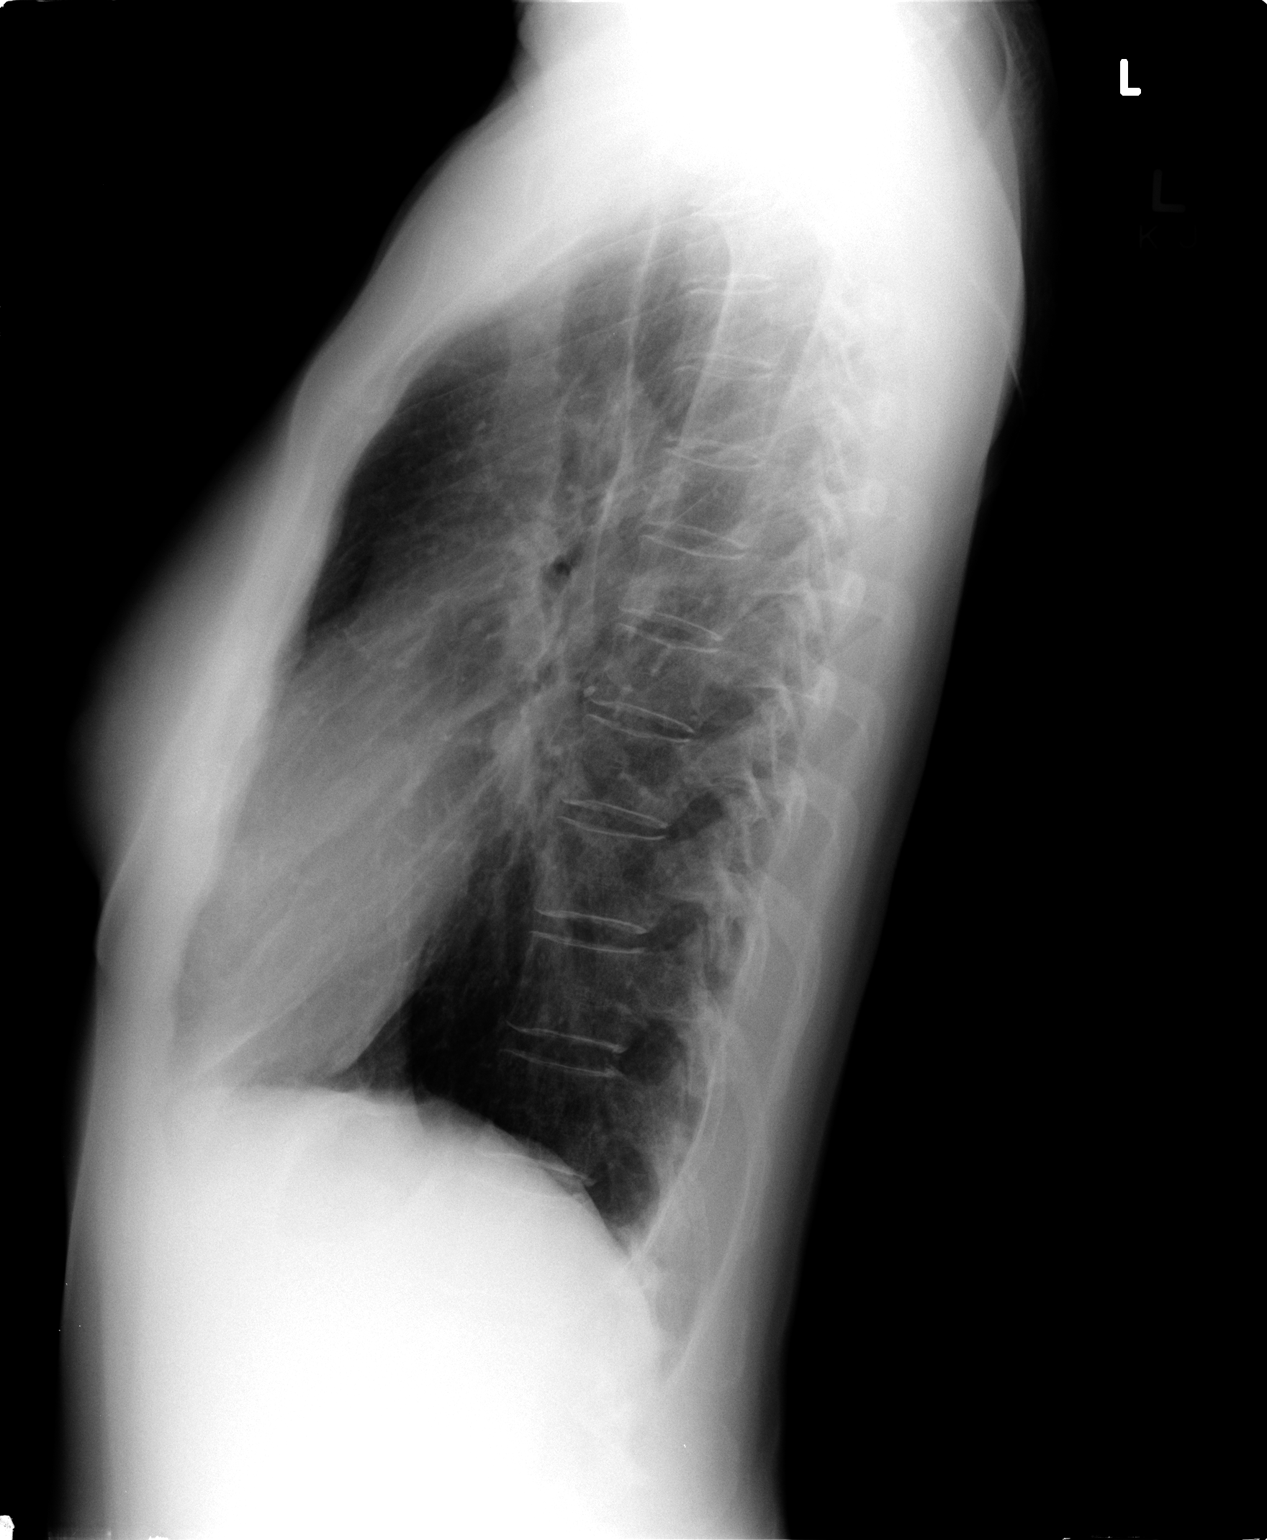

[2 of 2 positions shown; findings below may reference images not displayed]

FINDINGS: Cardiomediastinal silhouette is within normal limits. The
lungs are clear. No pleural effusion.  No pneumothorax.  No acute
osseous abnormality.
IMPRESSION: Normal chest.

## 2013-09-02 ENCOUNTER — Ambulatory Visit (HOSPITAL_COMMUNITY): Payer: Federal, State, Local not specified - PPO

## 2013-09-28 ENCOUNTER — Ambulatory Visit (HOSPITAL_COMMUNITY)
Admission: RE | Admit: 2013-09-28 | Discharge: 2013-09-28 | Disposition: A | Discharge status: Home / Self Care | Payer: Federal, State, Local not specified - PPO | Source: Ambulatory Visit | Attending: Obstetrics & Gynecology | Admitting: Obstetrics & Gynecology

## 2013-09-28 DIAGNOSIS — N951 Menopausal and female climacteric states: Secondary | ICD-10-CM | POA: Insufficient documentation

## 2013-09-28 DIAGNOSIS — Z1231 Encounter for screening mammogram for malignant neoplasm of breast: Secondary | ICD-10-CM | POA: Insufficient documentation

## 2013-09-28 DIAGNOSIS — M899 Disorder of bone, unspecified: Secondary | ICD-10-CM | POA: Insufficient documentation

## 2013-09-28 DIAGNOSIS — Z13820 Encounter for screening for osteoporosis: Secondary | ICD-10-CM | POA: Insufficient documentation

## 2014-05-12 ENCOUNTER — Telehealth: Payer: Self-pay | Admitting: Pulmonary Disease

## 2014-05-13 NOTE — Telephone Encounter (Signed)
lmomtcb x1 

## 2014-05-16 NOTE — Telephone Encounter (Signed)
appt scheduled with SN on 10/30. Pt is aware to fast for this appt.  Nothing further is needed.

## 2014-05-16 NOTE — Telephone Encounter (Signed)
[phone] returning a call

## 2014-05-16 NOTE — Telephone Encounter (Signed)
lmomtcb x 2  

## 2014-05-16 NOTE — Telephone Encounter (Signed)
lmomtcb x 3  

## 2014-05-31 ENCOUNTER — Other Ambulatory Visit: Payer: Self-pay | Admitting: Pulmonary Disease

## 2014-06-07 ENCOUNTER — Telehealth: Payer: Self-pay | Admitting: Pulmonary Disease

## 2014-06-07 NOTE — Telephone Encounter (Signed)
Called and spoke with pt and she is aware of appt tomorrow with SN.  Nothing further is needed.

## 2014-06-07 NOTE — Telephone Encounter (Signed)
Called and spoke to pt. Pt c/o sore throat, rhinorrhea, slight increase in SOB at night with activity, PND, non prod cough and chills: s/s since 06/02/14 . Pt has not taken tempeture. Pt is taking mucinex qd. Pt denies CP. Pt stated she also has a pink rash on left side of face that is warm to the touch, pt thinks it may be poison ivy exposure. Pt stated she was around poison ivy on 8/28. Pt is using calamine lotion on her face. Pt denies any change in her detergent, lotions or medications.   SN please advise.  No Known Allergies  Current Outpatient Prescriptions on File Prior to Visit  Medication Sig Dispense Refill  . b complex vitamins tablet Take 1 tablet by mouth daily.      . Calcium Carbonate-Vit D-Min (CALCIUM 1200 PO) Take by mouth. Take 1 tab once daily.      . Cholecalciferol (VITAMIN D3) 1000 UNITS CHEW Chew 1 tablet by mouth 2 (two) times daily.      . Diphenhyd-Hydrocort-Nystatin (FIRST-DUKES MOUTHWASH) SUSP Swish and swallow 1 teaspoon four times daily as needed for sore throat.  240 mL  1  . Diphenhydramine-APAP (PERCOGESIC) 12.5-325 MG TABS Take 1-2 tablets by mouth at bedtime.       . fluticasone (FLONASE) 50 MCG/ACT nasal spray Place 2 sprays into the nose 2 (two) times daily.  16 g  11  . LORazepam (ATIVAN) 1 MG tablet Take 1-2 mg by mouth at bedtime. And as needed during the day      . metoprolol (LOPRESSOR) 50 MG tablet Take 1/2 to 1 tablet by mouth two times daily as directed  60 tablet  6  . MOVIPREP 100 G SOLR moviprep as directed. No substitutions  1 kit  0  . Omega-3 Fatty Acids (FISH OIL) 1000 MG CAPS Take by mouth. Take 1 cap by mouth daily.      . sertraline (ZOLOFT) 100 MG tablet Take 100 mg by mouth at bedtime.      . traMADol (ULTRAM) 50 MG tablet TAKE ONE TABLET EVERY 6 HOURS AS NEEDED.  50 tablet  5  . valACYclovir (VALTREX) 500 MG tablet Take 1/2 tablet daily  for herpes simplex       No current facility-administered medications on file prior to visit.

## 2014-06-08 ENCOUNTER — Encounter: Payer: Self-pay | Admitting: Pulmonary Disease

## 2014-06-08 ENCOUNTER — Ambulatory Visit (INDEPENDENT_AMBULATORY_CARE_PROVIDER_SITE_OTHER): Payer: Federal, State, Local not specified - PPO | Admitting: Pulmonary Disease

## 2014-06-08 VITALS — BP 116/68 | HR 87 | Temp 97.9°F | Ht 67.0 in | Wt 128.2 lb

## 2014-06-08 DIAGNOSIS — J069 Acute upper respiratory infection, unspecified: Secondary | ICD-10-CM

## 2014-06-08 DIAGNOSIS — M159 Polyosteoarthritis, unspecified: Secondary | ICD-10-CM

## 2014-06-08 DIAGNOSIS — M549 Dorsalgia, unspecified: Secondary | ICD-10-CM

## 2014-06-08 DIAGNOSIS — F32A Depression, unspecified: Secondary | ICD-10-CM

## 2014-06-08 DIAGNOSIS — R21 Rash and other nonspecific skin eruption: Secondary | ICD-10-CM

## 2014-06-08 DIAGNOSIS — F3289 Other specified depressive episodes: Secondary | ICD-10-CM

## 2014-06-08 DIAGNOSIS — I059 Rheumatic mitral valve disease, unspecified: Secondary | ICD-10-CM

## 2014-06-08 DIAGNOSIS — F411 Generalized anxiety disorder: Secondary | ICD-10-CM

## 2014-06-08 DIAGNOSIS — F329 Major depressive disorder, single episode, unspecified: Secondary | ICD-10-CM

## 2014-06-08 DIAGNOSIS — K589 Irritable bowel syndrome without diarrhea: Secondary | ICD-10-CM

## 2014-06-08 DIAGNOSIS — M150 Primary generalized (osteo)arthritis: Secondary | ICD-10-CM

## 2014-06-08 DIAGNOSIS — M8949 Other hypertrophic osteoarthropathy, multiple sites: Secondary | ICD-10-CM

## 2014-06-08 MED ORDER — AZITHROMYCIN 250 MG PO TABS: ORAL_TABLET | ORAL | Status: DC

## 2014-06-08 MED ORDER — HYDROCODONE-HOMATROPINE 5-1.5 MG/5ML PO SYRP: 5.0000 mL | ORAL_SOLUTION | Freq: Four times a day (QID) | ORAL | Status: DC | PRN

## 2014-06-08 MED ORDER — TRAMADOL HCL 50 MG PO TABS: ORAL_TABLET | ORAL | Status: DC

## 2014-06-08 NOTE — Patient Instructions (Signed)
Today we updated your med list in our EPIC system...    Continue your current medications the same...  Take the ZPak as directed and you may use the Hycodan cough syrup- 1 tsp every 6h as needed for cough...  Call for any questions.Marland KitchenMarland Kitchen

## 2014-06-08 NOTE — Progress Notes (Signed)
Subjective:     Patient ID: Monique [name], female   DOB: 15-Jun-1956, 58 y.o.   MRN: [id number]  HPI 58 y/o WF here for a follow up visit... he has multiple medical problems as noted below... ~  SEE PREV EPIC NOTES FOR OLDER DATA >>   ~  January 01, 2012:  1mo ROV & Monique [name] recently saw TP w/ URI, congestion, drainage, etc; treated w/ Zyrtek, Mucinex, & Tylenol & did well;  She is under increased stress caring for her elderly parents (mother is Jaymes Graff);  Her CC today is "my thumb" c/o discomfort on left thumb at MCP joint> rec hot soaks, apply splint, anti-inflamm med rx (Voltaren Gel, Tramadol) & she wants referral to [name]- ok...  She also c/o intermittent dysphagia & states that even water will choke her on occas (occurs ?2d per week)> we discuused Rx w/ Protonix taken 30 min before a meal & f/u w/ [name] for further eval... Finally she notes additional palpitations on occas & was prev on BBlocker from [name]> she knows to avoid caffeine, etc;  Rx w/ Metoprolol 50mg - 1/2 to 1 tab as needed...  ~  July 13, 2012:  96mo ROV & Monique [name] is doing well overall- just had skin cancer removed from upper lip via Moh's;  She notes some recurrent pain at left thumb & wrist (saw [name] w/ XRays showing mild arthritis per pt <we don't have notes from him> & treated w/ Dosepak & Tramadol; also c/o some left ribcage discomfort & exam shows some tenderness, but clear lungs...    We reviewed prob list, meds, xrays and labs> see below for updates >> OK Flu shot today...  LABS 8/13:  FLP- wnl on diet alone;  Chems- wnl;  CBC- wnl;  TSH=2.53  CXR 10/13 showed normal heart size, clear lungs, NAD.Marland Kitchen.   ~  January 11, 2013:  52mo ROV & Monique [name] indicates that she has been working for [employer] helping w/ her parents... Medically she is stable w/o new complaints or concerns except for discomfort in her left thumb (see below)... We reviewed the following medical problems during today's office visit >>     AR,  Recurrent bronchitis> on Zyrtek, Saline, Flonase (refilled); recently treated w/ Augmentin/ Dosepak/ Hycodan & resolved back to baseline...    MVP, palpit> on Metop50- 1/2 to 1 tab prn palpit; she denies CP, recent palpit, SOB, dizzy, edema, etc...    Hx Dysphagia> see below- not currently on meds; she denies abd pain, recurrent dysphagia, n/v, c/d, blood seen...    Hx IBS & +FamHx colon ca in mother> her last colonoscopy was 2009 & wnl; f/u suggested 39yrs w/ her family hx...    Hx HepC> see below- treated w/ Interferon in 2002-3 & HepC viral RNA disappeared from her system; LFTs are wnl...    GYN> followed by [name]- on Valtrex500- 1/2 daily for prevention of outbreaks...    DJD> on Tramadol, calcium, MVI, VitD2000; he CC is pain in her left thumb thenar area- seen by [facility] but no better; she notes Medrol dosepak helped thumb pain & breathing; we discussed Rx w/ heat, Tramadol, Tylenol, & refer to Drsypher...    Anxiety, Depression> on Lorazepam1mg - taking 1-2Qhs and Zoloft100/d; still followed by [name]; under stress w/ parents ill...    Hx skin cancer> she has had several basal cells removed, icluding Moh's on upper lip 10/13 by DrAlbertini... We reviewed prob list, meds, xrays and labs> see below for updates >>   ~  July 19, 2013:  41mo ROV & Monique [name] is stable- "just feel tired" w/ lots of stress caring for mother, etc... We reviewed the following medical problems during today's office visit >>     AR, Recurrent bronchitis> on Zyrtek, Saline, Flonase, & MMW; prev treated w/ Augmentin/ Tussionex per [facility] 7/14 & improved...     MVP, palpit> on Metop50- 1/2 to 1 tab prn palpit; she states that 1/2 tab daily works well & denies CP, recent palpit, SOB, dizzy, edema, etc...    Hx Dysphagia> see below- not currently on meds; she denies abd pain, recurrent dysphagia, n/v, c/d, blood seen...    Hx IBS & +FamHx colon ca in mother> her last colonoscopy was 2009 & wnl; f/u suggested 79yrs w/ her  family hx & she will set this up.    Hx HepC> see below- treated w/ Interferon in 2002-3 & HepC viral RNA disappeared from her system; LFTs are wnl...    GYN> followed by [name]- on Valtrex500- 1/2 daily for prevention of outbreaks...    DJD> on Tramadol, calcium, MVI, VitD2000; he CC is pain in her left thumb thenar area- seen by [facility] but no better; she notes Medrol dosepak helped thumb pain & breathing; we discussed Rx w/ heat, Tramadol, Aleve/Tylenol; splint per DrSypher.    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    Hx skin cancer> she has had several basal cells removed, icluding Moh's on upper lip 10/13 by DrAlbertini... We reviewed prob list, meds, xrays and labs> see below for updates >> OK 2014 flu shot today...   LABS 10/14:  FLP- at goals on diet alone;  Chems- wnl;  CBC- wnl;  TSH=2.13;  VitD=50...   ~  June 08, 2014:  10-37mo ROV & add-on requested for mult symptoms> pt states that husb had recent URI, sore throat & passed it on to the pt w/ 1wk hx sore throat, nasal congestion, drainage of sl yellow mucus, aching/ sore, feels warm but didn't take temp, and cough- mostly dry hacking cough;  In addition she developed a left facial rash after working in yard (did pruning, no weed wacking etc), she thought it was poison ivy 7 started taking some Pred she had at home (started w/ 40- 30- 20- tapering) and used calamine lotion topically- she notes rash is better w/ min residual now;  Note- she also takes Valtrex500 daily for hx HSV2...  Exam shows stable VS, min pharyngeal erythema, no exud, no adenopathy, & min resid rash on left cheek;  We discussed Rx w/ ZPak, finish her home Pred taker, continue her prophylactic Valtrex dosing;  She requests cough syrup & Rx for Hycodan provided...     We reviewed prob list, meds, xrays and labs> see below for updates >>           Problem  List:    RHINITIS, ALLERGIC NOS (ICD-477.9) - uses ZYRTEK, Saline, FLONASE as needed... she notes sinus & "allergy" problems on & off... she wonders about what he sensitivities are & we discussed checking IgE (36) & Rast testing (pos to cat only)...  BRONCHITIS, RECURRENT (ICD-491.9) - exsmoker quit 1990's (20 yrs of up to 1.5ppd prior to that), uses Mucinex as needed ~  CXR 10/11 showed sl hyperinflation, mild incr markings & apical scarring, NAD... ~  1/12:  [facility] sounds similar to our prev films (not avail for review) see above. ~  CXR 10/13 showed normal heart size, clear lungs, NAD... ~  9/15: presented w/ mild URI & treated w/ ZPak, Mucinex, Hycodan...  MITRAL VALVE PROLAPSE (ICD-424.0) - she has [place] on exam... she notes rare palpit (self limited), & Echo 1986 w/ mild MVP. ~  2DEcho 11/08 showed norm LV w/ EF= 55-65% & no regional wall motion abn, mild thickening of MV w/ mild MR... ~  DrCDrenshaw treated her w/ BBlocker in the past for intermittent palpit & we will write for METOPROLOL 50mg  1/2 to 1 tab po prn... ~  10/14: she reports that Metoprolol50- 1/2 tab daily works well & she denies CP, palpit, SOB, etc... ~  9/15: on Metoprolol50- 1/2 to 1 Bid w/ BP=116/68 and exam shows gr2/6 SEM, she denies CP/ palpit/ SOB/ edema...  ABNORMAL ELECTROCARDIOGRAM (ICD-794.31) - poor R wave progression V1-V3, no known CAD  Intermittent DYSPHAGIA >> rec to start [place] 40mg  daily==> she is only taking it prn. ~  4/13:  She c/o intermittent dyspagia for solids & liqs that occurs 1-2 d per week; we will start PPI & refer to GI for EGD... ~  4/13:  Seen by [name] w/ EGD that was normal, Maloney54 dilator passed, rec to continue antireflux regimen, consider Ba Swallow...  IRRITABLE BOWEL SYNDROME (ICD-564.1) Family Hx of CANCER, COLORECTAL (ICD-154.0) - Mother w/ colon Ca age 71 (resected, no known recur); pt had normal colonoscopy 4/02, and f/u colonoscopy 9/09 was also neg- no  polyps w/ f/u sched 75yrs... ~  10/14:  She is due for f/u colonoscopy & will set this up w/ [name]...  Hx of HEPATITIS C (ICD-070.51) - Hx IV drug abuse as teen; Abn LFT's in late 80's/ early 90's; liver Bx 93 w/ chr persist hepatitis w/o necrosis (DrPatterson); serology pos for HepB antibodies, neg HBSAg, pos HepCAb; had pos HepC PCR 2002 (genotype 2B) & Rx w/ interferon 9/02-3/03 ([name]) w/ complete disappearance of HepC viral RNA; all subseq tests have shown - nl LFT's,  pos HepCAb, & neg HepC RNA Quant PCR. ~  labs 11/10 showed normal LFTs ~  Labs 1/12 showed LFTs are WNL.Marland Kitchen. ~  Labs 8/13 showed LFTs are WNL.Marland Kitchen. ~  LFTs remain WNL.Marland KitchenMarland Kitchen  HSV (ICD-054.9) - lab eval 5/08 by gyn Caleen Essex) & she has Valtrex from her... ~  Pt takes Valtrex 500mg  daily  DJD >> Rec to take TRAMADOL 50mg  prn & use VOLTAREN Gel when needed... ~  4/13: she is c/o pain in left thumb MCP joint; rec hot soaks, meds above, refer to [name] per her request... ~  10/13: she saw [name] 9we don't have note to review) & she reports XRay showed "mild arthritis" & improved w/ dosepak ~  4/14: still c/o pain in left thumb area & rec to take Tramadol, Tylenol, & we will refer to DrSypher=> seen & dx w/ CMC arthrosis, given splint/ biofreeze, aleve, & consider shot if not responding...  SYMPTOM, MEMORY LOSS (ICD-780.93) - new complaint 9/08 w/ essent nl MMSE, refer to neuro fo rfurther testing per pt request... seen by [name] who felt her memory disturbance, fatigue, & pain were c/w Fibromyalgia> EEG was normal, MRI brain was normal, Sleep Study was relatively unremarkable, and Labs were neg including Lyme titer... Rx'd w/ Seroquel 50mg  Qhs, tried Provigil10mg /d, w/ consideration for use of Cymbalta or Adderall depending on response.  Hx of PARESTHESIA (ICD-782.0) - eval by neuro ([name]) nothing found... she uses TRAMADOL 50mg  Prn.  ANXIETY & DEPRESSION >> Rx by [name] on Lamictal prev-  off now &  taking ZOLOFT  100mg /d & LORAZEPAM 0.5mg - taking 1.5 tabs at bedtime... ~  She wants to be sure that the chart reflects the fact that she does not have Bipolar illness (this Dx was entertained by [name] but she decided that [place] didn't have this)...  ANXIETY (ICD-300.00) - takes Lorazepam as noted... Under stress w/ parents illness...  SKIN CANCER, HX OF (ICD-V10.83) - several basal cell carcinomas removed in past... ~  10/13: she had basal cell removed from upper lip via Moh's by DrAlbertini...  Health Maintenance: ~  GI:  followed by [name]> colonoscopy 9/09 was neg- no polyps seen, f/u 18yrs. ~  GYN:  followed by [name] for PAP, Mammograms, BMD (?when done last & ?results)- Rx Calcium, MVI, Vit D). ~  Immunizations:  she declines the yearly seasonal Flu vaccine...   Past Surgical History  Procedure Laterality Date  . Rhinoplasty    . Tubal ligation      bilateral  . Appendectomy  11/08  . Mohs surgery      Outpatient Encounter Prescriptions as of 06/08/2014  Medication Sig  . b complex vitamins tablet Take 1 tablet by mouth daily.  . Calcium Carbonate-Vit D-Min (CALCIUM 1200 PO) Take by mouth. Take 1 tab once daily.  . cetirizine (ZYRTEC) 10 MG tablet Take 10 mg by mouth daily.  . Cholecalciferol (VITAMIN D3) 1000 UNITS CHEW Chew 1 tablet by mouth 2 (two) times daily.  . Diphenhyd-Hydrocort-Nystatin (FIRST-DUKES MOUTHWASH) SUSP Swish and swallow 1 teaspoon four times daily as needed for sore throat.  . Diphenhydramine-APAP (PERCOGESIC) 12.5-325 MG TABS Take 1-2 tablets by mouth at bedtime.   . fluticasone (FLONASE) 50 MCG/ACT nasal spray Place 2 sprays into the nose 2 (two) times daily.  Marland Kitchen guaiFENesin (MUCINEX) 600 MG 12 hr tablet Take 1,200 mg by mouth 2 (two) times daily.  Marland Kitchen LORazepam (ATIVAN) 1 MG tablet Take 1-2 mg by mouth at bedtime. And as needed during the day  . metoprolol (LOPRESSOR) 50 MG tablet Take 1/2 to 1 tablet by mouth two times daily as directed  . Omega-3 Fatty Acids  (FISH OIL) 1000 MG CAPS Take by mouth. Take 1 cap by mouth daily.  . sertraline (ZOLOFT) 100 MG tablet Take 100 mg by mouth at bedtime.  . sodium chloride (OCEAN) 0.65 % SOLN nasal spray Place 1 spray into both nostrils as needed for congestion.  . traMADol (ULTRAM) 50 MG tablet TAKE ONE TABLET EVERY 6 HOURS AS NEEDED.  Marland Kitchen valACYclovir (VALTREX) 500 MG tablet Take 1/2 tablet daily  for herpes simplex  . [DISCONTINUED] traMADol (ULTRAM) 50 MG tablet TAKE ONE TABLET EVERY 6 HOURS AS NEEDED.  Marland Kitchen azithromycin (ZITHROMAX) 250 MG tablet Take as directed  . HYDROcodone-homatropine (HYCODAN) 5-1.5 MG/5ML syrup Take 5 mLs by mouth every 6 (six) hours as needed for cough.  . [DISCONTINUED] MOVIPREP 100 G SOLR moviprep as directed. No substitutions    No Known Allergies   Current Medications, Allergies, Past Medical History, Past Surgical History, Family History, and Social History were reviewed in [employer] record.   Review of Systems        See HPI - all other systems neg except as noted...       The patient complains of dyspnea on exertion.  The patient denies anorexia, fever, weight loss, weight gain, vision loss, decreased hearing, hoarseness, chest pain, syncope, peripheral edema, prolonged cough, headaches, hemoptysis, abdominal pain, melena, hematochezia, severe indigestion/heartburn, hematuria, incontinence, muscle weakness, suspicious skin  lesions, transient blindness, difficulty walking, depression, unusual weight change, abnormal bleeding, enlarged lymph nodes, and angioedema.     Objective:   Physical Exam    WD, WN, 58 y/o WF in NAD... she is somewhat anxious. GENERAL:  Alert & oriented; pleasant & cooperative... HEENT:  [place]/AT, EOM-wnl, PERRLA, Fundi-benign, EACs-clear, TMs-wnl, NOSE-clear, THROAT- sl red w/o exudate NECK:  Supple w/ fairROM; no JVD; normal carotid impulses w/o bruits; no thyromegaly or nodules palpated; no lymphadenopathy. CHEST:  Clear to P &  A; without wheezes/ rales/ or rhonchi. HEART:  Regular Rhythm; Gr2/6 SEM at LSB w/o rubs or gallops. ABDOMEN:  Soft & nontender; normal bowel sounds; no organomegaly or masses detected. EXT: without deformities or arthritic changes; no varicose veins/ venous insuffic/ or edema; +triggers... NEURO:  CN's intact; motor testing normal; sensory testing normal; gait normal & balance OK. DERM:  No lesions noted; no rash etc...  RADIOLOGY DATA:  Reviewed in the EPIC EMR & discussed w/ the patient...  LABORATORY DATA:  Reviewed in the EPIC EMR & discussed w/ the patient...   Assessment:      AR, Bronchits>  She was allergic to cats on RAST test 2012; she gets occas bronchitic infections requiring antibiotis... 9/15> URI treated w/ ZPak, Mucinex, Hycodan...  MVP>  As noted- she gets occas palpit assoc w/ nerves etc;  She has METOPROLOL 50mg - 1/2to1 tab po bid as needed...  DYSPHAGIA>  Hx intermittent upper esoph/ throat symptoms w/ choking that occurs 1-2 per week, now improved;  Neg EGD w/ dilatation 4/13 by DrBrodie, pt has stopped the Protonix on her own...  IBS, Constip, +FamHx colon ca>  She is due for colonoscopy screening & we reviewed Miralax, Senakot-S, etc....  HxHepC>  Treated 2002-3 w/ Inerferon & resolved in clinical remission & LFTs have remained normal...  DJD>  We discussed use of Voltaren Gel & Tramadol for any aches or pains; refer to DrSypher for left thumb discomfort...  Anxiety/ Depression>  Followed by [name] & stable on Zoloft & Lorazepam... Pt notes that she is NOT bipolar...  Facial rash 9/15> she thought poison Ivy & took Pred she had at home=> improved, told her we couldn't r/o shingles, she is already on Valtrex low dose to prevent HSV2 outbreaks...     Plan:     Patient's Medications  New Prescriptions   AZITHROMYCIN (ZITHROMAX) 250 MG TABLET    Take as directed   HYDROCODONE-HOMATROPINE (HYCODAN) 5-1.5 MG/5ML SYRUP    Take 5 mLs by mouth every 6 (six)  hours as needed for cough.  Previous Medications   B COMPLEX VITAMINS TABLET    Take 1 tablet by mouth daily.   CALCIUM CARBONATE-VIT D-MIN (CALCIUM 1200 PO)    Take by mouth. Take 1 tab once daily.   CETIRIZINE (ZYRTEC) 10 MG TABLET    Take 10 mg by mouth daily.   CHOLECALCIFEROL (VITAMIN D3) 1000 UNITS CHEW    Chew 1 tablet by mouth 2 (two) times daily.   DIPHENHYD-HYDROCORT-NYSTATIN (FIRST-DUKES MOUTHWASH) SUSP    Swish and swallow 1 teaspoon four times daily as needed for sore throat.   DIPHENHYDRAMINE-APAP (PERCOGESIC) 12.5-325 MG TABS    Take 1-2 tablets by mouth at bedtime.    FLUTICASONE (FLONASE) 50 MCG/ACT NASAL SPRAY    Place 2 sprays into the nose 2 (two) times daily.   GUAIFENESIN (MUCINEX) 600 MG 12 HR TABLET    Take 1,200 mg by mouth 2 (two) times daily.   LORAZEPAM (ATIVAN) 1 MG TABLET  Take 1-2 mg by mouth at bedtime. And as needed during the day   METOPROLOL (LOPRESSOR) 50 MG TABLET    Take 1/2 to 1 tablet by mouth two times daily as directed   OMEGA-3 FATTY ACIDS (FISH OIL) 1000 MG CAPS    Take by mouth. Take 1 cap by mouth daily.   SERTRALINE (ZOLOFT) 100 MG TABLET    Take 100 mg by mouth at bedtime.   SODIUM CHLORIDE (OCEAN) 0.65 % SOLN NASAL SPRAY    Place 1 spray into both nostrils as needed for congestion.   VALACYCLOVIR (VALTREX) 500 MG TABLET    Take 1/2 tablet daily  for herpes simplex  Modified Medications   Modified Medication Previous Medication   TRAMADOL (ULTRAM) 50 MG TABLET traMADol (ULTRAM) 50 MG tablet      TAKE ONE TABLET EVERY 6 HOURS AS NEEDED.    TAKE ONE TABLET EVERY 6 HOURS AS NEEDED.  Discontinued Medications   MOVIPREP 100 G SOLR    moviprep as directed. No substitutions

## 2014-06-29 ENCOUNTER — Telehealth: Payer: Self-pay | Admitting: Pulmonary Disease

## 2014-06-29 MED ORDER — HYDROCOD POLST-CHLORPHEN POLST 10-8 MG/5ML PO LQCR: 5.0000 mL | Freq: Two times a day (BID) | ORAL | Status: DC | PRN

## 2014-06-29 MED ORDER — FIRST-DUKES MOUTHWASH MT SUSP: OROMUCOSAL | Status: DC

## 2014-06-29 NOTE — Telephone Encounter (Signed)
Called spoke with patient who reports that her throat feels "inflamed" and having a hacking cough after being outdoors for a football game over the weekend.  She reports her symptoms from the 9.9.15 ov had resolved prior to this.  Pt denies any f/c/s, n/v/d, purulent sputum, wheezing, dyspnea, tightness.  She is requesting an rx for magic mouthwash, and Tussionex (smallest quantity possible).  [facility] No Known Allergies  Dr Lenna Gilford please advise, thank you.

## 2014-06-29 NOTE — Telephone Encounter (Signed)
Per SN---  Ok to refill the tussionex and the MMW.  Pt is aware of the MMW that has been sent to the pharmacy and pt was given the rx for the tussionex.  Nothing further is needed.

## 2014-07-29 ENCOUNTER — Ambulatory Visit (INDEPENDENT_AMBULATORY_CARE_PROVIDER_SITE_OTHER): Payer: Federal, State, Local not specified - PPO | Admitting: Pulmonary Disease

## 2014-07-29 ENCOUNTER — Encounter: Payer: Self-pay | Admitting: Pulmonary Disease

## 2014-07-29 ENCOUNTER — Other Ambulatory Visit (INDEPENDENT_AMBULATORY_CARE_PROVIDER_SITE_OTHER): Payer: Federal, State, Local not specified - PPO

## 2014-07-29 VITALS — BP 100/70 | HR 58 | Temp 97.7°F | Ht 67.0 in | Wt 133.2 lb

## 2014-07-29 DIAGNOSIS — I059 Rheumatic mitral valve disease, unspecified: Secondary | ICD-10-CM

## 2014-07-29 DIAGNOSIS — M150 Primary generalized (osteo)arthritis: Secondary | ICD-10-CM

## 2014-07-29 DIAGNOSIS — M545 Low back pain, unspecified: Secondary | ICD-10-CM

## 2014-07-29 DIAGNOSIS — M8949 Other hypertrophic osteoarthropathy, multiple sites: Secondary | ICD-10-CM

## 2014-07-29 DIAGNOSIS — E7800 Pure hypercholesterolemia, unspecified: Secondary | ICD-10-CM

## 2014-07-29 DIAGNOSIS — K589 Irritable bowel syndrome without diarrhea: Secondary | ICD-10-CM

## 2014-07-29 DIAGNOSIS — F32A Depression, unspecified: Secondary | ICD-10-CM

## 2014-07-29 DIAGNOSIS — J302 Other seasonal allergic rhinitis: Secondary | ICD-10-CM

## 2014-07-29 DIAGNOSIS — F411 Generalized anxiety disorder: Secondary | ICD-10-CM

## 2014-07-29 DIAGNOSIS — F329 Major depressive disorder, single episode, unspecified: Secondary | ICD-10-CM

## 2014-07-29 DIAGNOSIS — Z0000 Encounter for general adult medical examination without abnormal findings: Secondary | ICD-10-CM

## 2014-07-29 DIAGNOSIS — M159 Polyosteoarthritis, unspecified: Secondary | ICD-10-CM

## 2014-07-29 DIAGNOSIS — R9431 Abnormal electrocardiogram [ECG] [EKG]: Secondary | ICD-10-CM

## 2014-07-29 DIAGNOSIS — Z23 Encounter for immunization: Secondary | ICD-10-CM

## 2014-07-29 LAB — BASIC METABOLIC PANEL
BUN: 14 mg/dL (ref 6–23)
CHLORIDE: 106 meq/L (ref 96–112)
CO2: 26 mEq/L (ref 19–32)
Calcium: 9.1 mg/dL (ref 8.4–10.5)
Creatinine, Ser: 0.7 mg/dL (ref 0.4–1.2)
GFR: 95.8 mL/min (ref >60.00)
Glucose, Bld: 75 mg/dL (ref 70–99)
Potassium: 4.2 mEq/L (ref 3.5–5.1)
SODIUM: 140 meq/L (ref 135–145)

## 2014-07-29 LAB — CBC WITH DIFFERENTIAL/PLATELET
BASOS PCT: 0.5 % (ref 0.0–3.0)
Basophils Absolute: 0 10*3/uL (ref 0.0–0.1)
EOS ABS: 0.1 10*3/uL (ref 0.0–0.7)
EOS PCT: 2.3 % (ref 0.0–5.0)
HEMATOCRIT: 38 % (ref 36.0–46.0)
Hemoglobin: 12.6 g/dL (ref 12.0–15.0)
LYMPHS ABS: 1.5 10*3/uL (ref 0.7–4.0)
Lymphocytes Relative: 28.7 % (ref 12.0–46.0)
MCHC: 33.3 g/dL (ref 30.0–36.0)
MCV: 92.1 fl (ref 78.0–100.0)
MONO ABS: 0.5 10*3/uL (ref 0.1–1.0)
Monocytes Relative: 9.3 % (ref 3.0–12.0)
Neutro Abs: 3 10*3/uL (ref 1.4–7.7)
Neutrophils Relative %: 59.2 % (ref 43.0–77.0)
PLATELETS: 189 10*3/uL (ref 150.0–400.0)
RBC: 4.13 Mil/uL (ref 3.87–5.11)
RDW: 14.2 % (ref 11.5–15.5)
WBC: 5.1 10*3/uL (ref 4.0–10.5)

## 2014-07-29 LAB — TSH: TSH: 1.76 u[IU]/mL (ref 0.35–4.50)

## 2014-07-29 LAB — LIPID PANEL
CHOL/HDL RATIO: 2
Cholesterol: 180 mg/dL (ref 0–200)
HDL: 76.4 mg/dL (ref >39.00)
LDL Cholesterol: 93 mg/dL (ref 0–99)
NONHDL: 103.6
TRIGLYCERIDES: 54 mg/dL (ref 0.0–149.0)
VLDL: 10.8 mg/dL (ref 0.0–40.0)

## 2014-07-29 LAB — HEPATIC FUNCTION PANEL
ALT: 15 U/L (ref 0–35)
AST: 24 U/L (ref 0–37)
Albumin: 3.7 g/dL (ref 3.5–5.2)
Alkaline Phosphatase: 75 U/L (ref 39–117)
BILIRUBIN DIRECT: 0.1 mg/dL (ref 0.0–0.3)
BILIRUBIN TOTAL: 0.5 mg/dL (ref 0.2–1.2)
Total Protein: 7.5 g/dL (ref 6.0–8.3)

## 2014-07-29 MED ORDER — METOPROLOL TARTRATE 50 MG PO TABS: ORAL_TABLET | ORAL | Status: DC

## 2014-07-29 MED ORDER — AZITHROMYCIN 250 MG PO TABS: ORAL_TABLET | ORAL | Status: DC

## 2014-07-29 MED ORDER — FLUTICASONE PROPIONATE 50 MCG/ACT NA SUSP: 2.0000 | Freq: Two times a day (BID) | NASAL | Status: DC

## 2014-07-29 NOTE — Patient Instructions (Signed)
Today we updated your med list in our EPIC system...    Continue your current medications the same...  Today we did your follow up EKG & FASTING blood work...    We will contact you w/ the results when available...   We refilled your meds per request, including a ZPak for as needed use...  Today we gave you the 2015 Flu vaccine & a combination Tetanus shot called the TDAP (good for 10 yrs).  Call for any questions...  Let's plan a follow up visit in 83yr, sooner if needed for problems.Marland KitchenMarland Kitchen

## 2014-07-29 NOTE — Progress Notes (Signed)
Subjective:     Patient ID: Monique [name], female   DOB: 05/25/1956, 58 y.o.   MRN: [id number]  HPI 58 y/o WF here for a follow up visit... he has multiple medical problems as noted below... ~  SEE PREV EPIC NOTES FOR OLDER DATA >>   ~  July 13, 2012:  19mo ROV & Neoma Laming is doing well overall- just had skin cancer removed from upper lip via Moh's;  She notes some recurrent pain at left thumb & wrist (saw [name] w/ XRays showing mild arthritis per pt <we don't have notes from him> & treated w/ Dosepak & Tramadol; also c/o some left ribcage discomfort & exam shows some tenderness, but clear lungs...    We reviewed prob list, meds, xrays and labs> see below for updates >> OK Flu shot today...  LABS 8/13:  FLP- wnl on diet alone;  Chems- wnl;  CBC- wnl;  TSH=2.53  CXR 10/13 showed normal heart size, clear lungs, NAD.Marland Kitchen.   ~  January 11, 2013:  21mo ROV & Monique [name] indicates that she has been working for [employer] helping w/ her parents... Medically she is stable w/o new complaints or concerns except for discomfort in her left thumb (see below)... We reviewed the following medical problems during today's office visit >>     AR, Recurrent bronchitis> on Zyrtek, Saline, Flonase (refilled); recently treated w/ Augmentin/ Dosepak/ Hycodan & resolved back to baseline...    MVP, palpit> on Metop50- 1/2 to 1 tab prn palpit; she denies CP, recent palpit, SOB, dizzy, edema, etc...    Hx Dysphagia> see below- not currently on meds; she denies abd pain, recurrent dysphagia, n/v, c/d, blood seen...    Hx IBS & +FamHx colon ca in mother> her last colonoscopy was 2009 & wnl; f/u suggested 57yrs w/ her family hx...    Hx HepC> see below- treated w/ Interferon in 2002-3 & HepC viral RNA disappeared from her system; LFTs are wnl...    GYN> followed by [name]- on Valtrex500- 1/2 daily for prevention of outbreaks...    DJD> on Tramadol, calcium, MVI, VitD2000; he CC is pain in her left thumb thenar area- seen by [facility]  but no better; she notes Medrol dosepak helped thumb pain & breathing; we discussed Rx w/ heat, Tramadol, Tylenol, & refer to Drsypher...    Anxiety, Depression> on Lorazepam1mg - taking 1-2Qhs and Zoloft100/d; still followed by [name]; under stress w/ parents ill...    Hx skin cancer> she has had several basal cells removed, icluding Moh's on upper lip 10/13 by DrAlbertini... We reviewed prob list, meds, xrays and labs> see below for updates >>   ~  July 19, 2013:  59mo ROV & Monique [name] is stable- "just feel tired" w/ lots of stress caring for mother, etc... We reviewed the following medical problems during today's office visit >>     AR, Recurrent bronchitis> on Zyrtek, Saline, Flonase, & MMW; prev treated w/ Augmentin/ Tussionex per [facility] 7/14 & improved...     MVP, palpit> on Metop50- 1/2 to 1 tab prn palpit; she states that 1/2 tab daily works well & denies CP, recent palpit, SOB, dizzy, edema, etc...    Hx Dysphagia> see below- not currently on meds; she denies abd pain, recurrent dysphagia, n/v, c/d, blood seen...    Hx IBS & +FamHx colon ca in mother> her last colonoscopy was 2009 & wnl; f/u suggested 68yrs w/ her family hx & she will set this up.    Hx HepC> see below-  treated w/ Interferon in 2002-3 & HepC viral RNA disappeared from her system; LFTs are wnl...    GYN> followed by [name]- on Valtrex500- 1/2 daily for prevention of outbreaks...    DJD> on Tramadol, calcium, MVI, VitD2000; he CC is pain in her left thumb thenar area- seen by [facility] but no better; she notes Medrol dosepak helped thumb pain & breathing; we discussed Rx w/ heat, Tramadol, Aleve/Tylenol; splint per DrSypher.    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    Hx skin cancer> she has had several basal cells removed, icluding Moh's on upper lip 10/13 by DrAlbertini... We reviewed prob list, meds, xrays and labs>  see below for updates >> OK 2014 flu shot today...   LABS 10/14:  FLP- at goals on diet alone;  Chems- wnl;  CBC- wnl;  TSH=2.13;  VitD=50...   ~  June 08, 2014:  10-61mo ROV & add-on requested for mult symptoms> pt states that husb had recent URI, sore throat & passed it on to the pt w/ 1wk hx sore throat, nasal congestion, drainage of sl yellow mucus, aching/ sore, feels warm but didn't take temp, and cough- mostly dry hacking cough;  In addition she developed a left facial rash after working in yard (did pruning, no weed wacking etc), she thought it was poison ivy & started taking some Pred she had at home (started w/ 40- 30- 20- tapering) and used calamine lotion topically- she notes rash is better w/ min residual now;  Note- she also takes Valtrex500 daily for hx HSV2...  Exam shows stable VS, min pharyngeal erythema, no exud, no adenopathy, & min resid rash on left cheek;  We discussed Rx w/ ZPak, finish her home Pred taper, continue her prophylactic Valtrex dosing;  She requests cough syrup & Rx for Hycodan provided...     We reviewed prob list, meds, xrays and labs> see below for updates >> Her mother Jaymes Graff passed away [id number]...  ~  Aug 15, 2014:  6wk recheck & time for her yearly CPX> Monique [name] responded nicely to the [place] after her last OV & URI resolved; now back to baseline, doing well w/o new complaints or concerns; We reviewed the following medical problems during today's visit >>     AR, Recurrent bronchitis> on Zyrtek, Saline, Flonase, & MMW; prev treated w/ Augmentin/ Tussionex in 2014 & ZPak/ Pred/ Hycodan in 2015...    MVP, palpit> on Metop50- 1/2 to 1 tab Bid prn palpit; she states that 1/2 tab daily works well & denies CP, recent palpit, SOB, dizzy, edema, etc...    Hx Dysphagia> see below- not currently on meds but has Prilosec20 prn; she denies abd pain, recurrent dysphagia, n/v, c/d, blood seen...    Hx IBS & +FamHx colon ca in mother> her last colonoscopy was 11/14  by [name] and was WNL, no polyps, divertics, hems... F/u planned in 5 yrs.    Hx HepC> see below- treated w/ Interferon in 2002-3 & HepC viral RNA disappeared from her system; LFTs are wnl...    GYN> followed by Caleen Essex- on Valtrex500- 1/2 daily for prevention of outbreaks; Mammograms have been neg; BMD 12/14 showed Tscores -1.9 in Lspine & -2.3 in right FemNeck on calcium, MVI, VitD...    DJD> on Tramadol, calcium, MVI, VitD2000; he CC is pain in her left thumb thenar area- seen by [facility] but no better; she notes Medrol dosepak  helped thumb pain & breathing; we discussed Rx w/ heat, Tramadol, Aleve/Tylenol; splint per DrSypher.    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    Hx skin cancer> she has had several basal cells removed, icluding Moh's on upper lip 10/13 by DrAlbertini... We reviewed prob list, meds, xrays and labs> see below for updates >> Given 2015 Flu vaccine & TDAP today...  EKG 10/15 showed SBrady, rate52, poor R progression from V1-V3, otherw wnl...  LABS 10/15:  FLP- wnl on diet alone;  Chems- wnl including LFTs;  CBC- wnl w/ Hg=12.6;  TSH=1.76...          Problem List:    RHINITIS, ALLERGIC NOS (ICD-477.9) - uses ZYRTEK, Saline, FLONASE as needed... she notes sinus & "allergy" problems on & off... she wonders about what he sensitivities are & we discussed checking IgE (36) & Rast testing (pos to cat only)...  BRONCHITIS, RECURRENT (ICD-491.9) - exsmoker quit 1990's (20 yrs of up to 1.5ppd prior to that), uses Mucinex as needed ~  CXR 10/11 showed sl hyperinflation, mild incr markings & apical scarring, NAD... ~  1/12:  [facility]XR report from [facility] sounds similar to our prev films (not avail for review) see above. ~  CXR 10/13 showed normal heart size, clear lungs, NAD... ~  9/15: presented w/ mild URI & treated w/ ZPak, Mucinex, Hycodan=> resolved.  MITRAL VALVE  PROLAPSE (ICD-424.0) - she has [place] on exam... she notes rare palpit (self limited), & Echo 1986 w/ mild MVP. ~  2DEcho 11/08 showed norm LV w/ EF= 55-65% & no regional wall motion abn, mild thickening of MV w/ mild MR... ~  DrCDrenshaw treated her w/ BBlocker in the past for intermittent palpit & we will write for METOPROLOL 50mg  1/2 to 1 tab po prn... ~  10/14: she reports that Metoprolol50- 1/2 tab daily works well & she denies CP, palpit, SOB, etc... ~  9/15: on Metoprolol50- 1/2 to 1 Bid w/ BP=116/68 and exam shows gr1-2/6 SEM, she denies CP/ palpit/ SOB/ edema...  ABNORMAL ELECTROCARDIOGRAM (ICD-794.31) - poor R wave progression V1-V3, no known CAD ~  EKG 10/15 showed SBrady, rate52, poor R progression from V1-V3, otherw wnl...  Intermittent DYSPHAGIA >> rec to start [place] 40mg  daily==> she is only taking it prn. ~  4/13:  She c/o intermittent dyspagia for solids & liqs that occurs 1-2 d per week; we will start PPI & refer to GI for EGD... ~  4/13:  Seen by [name] w/ EGD that was normal, [place]54 dilator passed, rec to continue antireflux regimen, consider Ba Swallow...  IRRITABLE BOWEL SYNDROME (ICD-564.1) Family Hx of CANCER, COLORECTAL (ICD-154.0) - Mother w/ colon Ca age 39 (resected, no known recur); pt had normal colonoscopy 4/02, and f/u colonoscopy 9/09 was also neg- no polyps w/ f/u sched 89yrs... ~  10/14:  Colonoscopy by [name] was wnl- no polyps, divertics, or hems... F/u planned 75yrs.  Hx of HEPATITIS C (ICD-070.51) - Hx IV drug abuse as teen; Abn LFT's in late 80's/ early 90's; liver Bx 93 w/ chr persist hepatitis w/o necrosis (DrPatterson); serology pos for HepB antibodies, neg HBSAg, pos HepCAb; had pos HepC PCR 2002 (genotype 2B) & Rx w/ interferon 9/02-3/03 ([name]) w/ complete disappearance of HepC viral RNA; all subseq tests have shown - nl LFT's,  pos HepCAb, & neg HepC RNA Quant PCR. ~  labs 11/10 showed normal LFTs ~  Labs 1/12  showed LFTs are WNL.Marland Kitchen. ~   Labs 8/13 showed LFTs are WNL.Marland Kitchen. ~  LFTs remain WNL.Marland KitchenMarland Kitchen  HSV (ICD-054.9) - lab eval 5/08 by gyn Caleen Essex) & she has Valtrex from her... ~  Pt takes Valtrex 500mg  daily per Gyn  DJD >> Rec to take TRAMADOL 50mg  prn & use VOLTAREN Gel when needed... ~  4/13: she is c/o pain in left thumb MCP joint; rec hot soaks, meds above, refer to [name] per her request... ~  10/13: she saw [name] 9we don't have note to review) & she reports XRay showed "mild arthritis" & improved w/ dosepak ~  4/14: still c/o pain in left thumb area & rec to take Tramadol, Tylenol, & we will refer to DrSypher=> seen & dx w/ CMC arthrosis, given splint/ biofreeze, aleve, & consider shot if not responding...  SYMPTOM, MEMORY LOSS (ICD-780.93) - new complaint 9/08 w/ essent nl MMSE, refer to neuro fo rfurther testing per pt request... seen by [name] who felt her memory disturbance, fatigue, & pain were c/w Fibromyalgia> EEG was normal, MRI brain was normal, Sleep Study was relatively unremarkable, and Labs were neg including Lyme titer... Rx'd w/ Seroquel 50mg  Qhs, tried Provigil10mg /d, w/ consideration for use of Cymbalta or Adderall depending on response.  Hx of PARESTHESIA (ICD-782.0) - eval by neuro ([name]) nothing found... she uses TRAMADOL 50mg  Prn.  ANXIETY & DEPRESSION >> Rx by [name] on Lamictal prev- off now &  taking ZOLOFT 100mg /d & LORAZEPAM 0.5mg - taking 1.5 tabs at bedtime... ~  She wants to be sure that the chart reflects the fact that she does not have Bipolar illness (this Dx was entertained by [name] but she decided that [place] didn't have this)... ~  10/15: she remains on Zoloft100 and Lorazepam1mg  at bedtime...  ANXIETY (ICD-300.00) - takes Lorazepam as noted... Under stress as the family care giver...  SKIN CANCER, HX OF (ICD-V10.83) - several basal cell carcinomas removed in past... ~  10/13: she had basal cell removed from upper lip via Moh's by DrAlbertini...  Health Maintenance: ~   GI:  followed by [name]> colonoscopy 9/09 & 11/14 were neg- no polyps seen, f/u 59yrs. ~  GYN:  followed by [name] for PAP, Mammograms, BMD- Rx Calcium, MVI, Vit D). ~  Immunizations:  Given TDAP & the 2015 Flu vaccine 10/15...   Past Surgical History  Procedure Laterality Date  . Rhinoplasty    . Tubal ligation      bilateral  . Appendectomy  11/08  . Mohs surgery      Outpatient Encounter Prescriptions as of 07/29/2014  Medication Sig  . b complex vitamins tablet Take 1 tablet by mouth daily.  . Calcium Carbonate-Vit D-Min (CALCIUM 1200 PO) Take by mouth. Take 1 tab once daily.  . cetirizine (ZYRTEC) 10 MG tablet Take 10 mg by mouth daily.  . chlorpheniramine-HYDROcodone (TUSSIONEX PENNKINETIC ER) 10-8 MG/5ML LQCR Take 5 mLs by mouth every 12 (twelve) hours as needed for cough.  . Cholecalciferol (VITAMIN D3) 1000 UNITS CHEW Chew 1 tablet by mouth 2 (two) times daily.  . Diphenhydramine-APAP (PERCOGESIC) 12.5-325 MG TABS Take 1-2 tablets by mouth at bedtime.   . fluticasone (FLONASE) 50 MCG/ACT nasal spray Place 2 sprays into the nose 2 (two) times daily.  Marland Kitchen guaiFENesin (MUCINEX) 600 MG 12 hr tablet Take 1,200 mg by mouth 2 (two) times daily.  Marland Kitchen LORazepam (ATIVAN) 1 MG tablet Take 1-2 mg by mouth at bedtime. And as needed during the day  . metoprolol (LOPRESSOR) 50  MG tablet Take 1/2 to 1 tablet by mouth two times daily as directed  . Omega-3 Fatty Acids (FISH OIL) 1000 MG CAPS Take by mouth. Take 1 cap by mouth daily.  . sertraline (ZOLOFT) 100 MG tablet Take 100 mg by mouth at bedtime.  . sodium chloride (OCEAN) 0.65 % SOLN nasal spray Place 1 spray into both nostrils as needed for congestion.  . traMADol (ULTRAM) 50 MG tablet TAKE ONE TABLET EVERY 6 HOURS AS NEEDED.  Marland Kitchen valACYclovir (VALTREX) 500 MG tablet Take 1/2 tablet daily  for herpes simplex  . azithromycin (ZITHROMAX) 250 MG tablet Take as directed  . Diphenhyd-Hydrocort-Nystatin (FIRST-DUKES MOUTHWASH) SUSP Swish and  swallow 1 teaspoon four times daily as needed for sore throat.  . [DISCONTINUED] HYDROcodone-homatropine (HYCODAN) 5-1.5 MG/5ML syrup Take 5 mLs by mouth every 6 (six) hours as needed for cough.    No Known Allergies   Current Medications, Allergies, Past Medical History, Past Surgical History, Family History, and Social History were reviewed in [employer] record.   Review of Systems        See HPI - all other systems neg except as noted...       The patient complains of dyspnea on exertion.  The patient denies anorexia, fever, weight loss, weight gain, vision loss, decreased hearing, hoarseness, chest pain, syncope, peripheral edema, prolonged cough, headaches, hemoptysis, abdominal pain, melena, hematochezia, severe indigestion/heartburn, hematuria, incontinence, muscle weakness, suspicious skin lesions, transient blindness, difficulty walking, depression, unusual weight change, abnormal bleeding, enlarged lymph nodes, and angioedema.     Objective:   Physical Exam    WD, WN, 58 y/o WF in NAD... she is somewhat anxious. GENERAL:  Alert & oriented; pleasant & cooperative... HEENT:  [place]/AT, EOM-wnl, PERRLA, Fundi-benign, EACs-clear, TMs-wnl, NOSE-clear, THROAT-neg/ clear... NECK:  Supple w/ fairROM; no JVD; normal carotid impulses w/o bruits; no thyromegaly or nodules palpated; no lymphadenopathy. CHEST:  Clear to P & A; without wheezes/ rales/ or rhonchi. HEART:  Regular Rhythm; Gr1-2/6 SEM at LSB w/o rubs or gallops. ABDOMEN:  Soft & nontender; normal bowel sounds; no organomegaly or masses detected. EXT: without deformities or arthritic changes; no varicose veins/ venous insuffic/ or edema; +triggers... NEURO:  CN's intact; motor testing normal; sensory testing normal; gait normal & balance OK. DERM:  No lesions noted; no rash etc...  RADIOLOGY DATA:  Reviewed in the EPIC EMR & discussed w/ the patient...  LABORATORY DATA:  Reviewed in the EPIC EMR &  discussed w/ the patient...   Assessment:      AR, Bronchits>  She was allergic to cats on RAST test 2012; she gets occas bronchitic infections requiring antibiotis... 9/15> URI treated w/ ZPak, Mucinex, Hycodan=> resolved, want ZPak for prn use...  MVP>  As noted- she gets occas palpit assoc w/ nerves etc;  She has METOPROLOL 50mg - 1/2to1 tab po bid as needed...  DYSPHAGIA>  Hx intermittent upper esoph/ throat symptoms w/ choking that occurs 1-2 per week, now improved;  Neg EGD w/ dilatation 4/13 by DrBrodie, pt has stopped the Protonix on her own...  IBS, Constip, +FamHx colon ca>  She is up to date on colonoscopies etc, we reviewed Miralax, Senakot-S, etc....  HxHepC>  Treated 2002-3 w/ Inerferon & resolved in clinical remission & LFTs have remained normal...  DJD>  We discussed use of Voltaren Gel & Tramadol for any aches or pains; refer to DrSypher for left thumb discomfort...  Anxiety/ Depression>  Followed by [name] & stable on Zoloft & Lorazepam..Marland Kitchen  Pt notes that she is NOT bipolar...  Facial rash 9/15> she thought poison Ivy & took Pred she had at home=> improved, told her we couldn't r/o shingles, she is already on Valtrex low dose to prevent HSV2 outbreaks...     Plan:     Patient's Medications  New Prescriptions   No medications on file  Previous Medications   B COMPLEX VITAMINS TABLET    Take 1 tablet by mouth daily.   CALCIUM CARBONATE-VIT D-MIN (CALCIUM 1200 PO)    Take by mouth. Take 1 tab once daily.   CETIRIZINE (ZYRTEC) 10 MG TABLET    Take 10 mg by mouth daily.   CHLORPHENIRAMINE-HYDROCODONE (TUSSIONEX PENNKINETIC ER) 10-8 MG/5ML LQCR    Take 5 mLs by mouth every 12 (twelve) hours as needed for cough.   CHOLECALCIFEROL (VITAMIN D3) 1000 UNITS CHEW    Chew 1 tablet by mouth 2 (two) times daily.   DIPHENHYD-HYDROCORT-NYSTATIN (FIRST-DUKES MOUTHWASH) SUSP    Swish and swallow 1 teaspoon four times daily as needed for sore throat.   DIPHENHYDRAMINE-APAP  (PERCOGESIC) 12.5-325 MG TABS    Take 1-2 tablets by mouth at bedtime.    GUAIFENESIN (MUCINEX) 600 MG 12 HR TABLET    Take 1,200 mg by mouth 2 (two) times daily.   LORAZEPAM (ATIVAN) 1 MG TABLET    Take 1-2 mg by mouth at bedtime. And as needed during the day   OMEGA-3 FATTY ACIDS (FISH OIL) 1000 MG CAPS    Take by mouth. Take 1 cap by mouth daily.   SERTRALINE (ZOLOFT) 100 MG TABLET    Take 100 mg by mouth at bedtime.   SODIUM CHLORIDE (OCEAN) 0.65 % SOLN NASAL SPRAY    Place 1 spray into both nostrils as needed for congestion.   TRAMADOL (ULTRAM) 50 MG TABLET    TAKE ONE TABLET EVERY 6 HOURS AS NEEDED.   VALACYCLOVIR (VALTREX) 500 MG TABLET    Take 1/2 tablet daily  for herpes simplex  Modified Medications   Modified Medication Previous Medication   AZITHROMYCIN (ZITHROMAX) 250 MG TABLET azithromycin (ZITHROMAX) 250 MG tablet      Take as directed    Take as directed   FLUTICASONE (FLONASE) 50 MCG/ACT NASAL SPRAY fluticasone (FLONASE) 50 MCG/ACT nasal spray      Place 2 sprays into both nostrils 2 (two) times daily.    Place 2 sprays into the nose 2 (two) times daily.   METOPROLOL (LOPRESSOR) 50 MG TABLET metoprolol (LOPRESSOR) 50 MG tablet      Take 1/2 to 1 tablet by mouth two times daily as directed    Take 1/2 to 1 tablet by mouth two times daily as directed  Discontinued Medications   HYDROCODONE-HOMATROPINE (HYCODAN) 5-1.5 MG/5ML SYRUP    Take 5 mLs by mouth every 6 (six) hours as needed for cough.

## 2014-07-30 DIAGNOSIS — Z0000 Encounter for general adult medical examination without abnormal findings: Secondary | ICD-10-CM | POA: Insufficient documentation

## 2014-08-11 ENCOUNTER — Ambulatory Visit: Payer: Federal, State, Local not specified - PPO

## 2014-08-11 ENCOUNTER — Telehealth: Payer: Self-pay | Admitting: Pulmonary Disease

## 2014-08-11 MED ORDER — PREDNISONE (PAK) 5 MG PO TABS: 5.0000 mg | ORAL_TABLET | ORAL | Status: DC

## 2014-08-11 NOTE — Telephone Encounter (Signed)
Called and spoke to pt. Pt c/o right sided rib pain when she inhales, coughs or yells. Pt has been splinting the area when she coughs. Pt stated she is also having palpitations, non prod cough with chest congestion. Pt is worried it is pleurisy because she has had it before and states this is the same feeling. Pt denies fever, SOB. Pt is taking tramadol for the pain and states it is helping. Pt alst seen by SN on 10/30.   SN please advise.  No Known Allergies   Current Outpatient Prescriptions on File Prior to Visit  Medication Sig Dispense Refill  . azithromycin (ZITHROMAX) 250 MG tablet Take as directed 6 tablet 1  . b complex vitamins tablet Take 1 tablet by mouth daily.    . Calcium Carbonate-Vit D-Min (CALCIUM 1200 PO) Take by mouth. Take 1 tab once daily.    . cetirizine (ZYRTEC) 10 MG tablet Take 10 mg by mouth daily.    . chlorpheniramine-HYDROcodone (TUSSIONEX PENNKINETIC ER) 10-8 MG/5ML LQCR Take 5 mLs by mouth every 12 (twelve) hours as needed for cough. 120 mL 0  . Cholecalciferol (VITAMIN D3) 1000 UNITS CHEW Chew 1 tablet by mouth 2 (two) times daily.    . Diphenhyd-Hydrocort-Nystatin (FIRST-DUKES MOUTHWASH) SUSP Swish and swallow 1 teaspoon four times daily as needed for sore throat. 240 mL 1  . Diphenhydramine-APAP (PERCOGESIC) 12.5-325 MG TABS Take 1-2 tablets by mouth at bedtime.     . fluticasone (FLONASE) 50 MCG/ACT nasal spray Place 2 sprays into both nostrils 2 (two) times daily. 16 g 11  . guaiFENesin (MUCINEX) 600 MG 12 hr tablet Take 1,200 mg by mouth 2 (two) times daily.    Marland Kitchen LORazepam (ATIVAN) 1 MG tablet Take 1-2 mg by mouth at bedtime. And as needed during the day    . metoprolol (LOPRESSOR) 50 MG tablet Take 1/2 to 1 tablet by mouth two times daily as directed 60 tablet 6  . Omega-3 Fatty Acids (FISH OIL) 1000 MG CAPS Take by mouth. Take 1 cap by mouth daily.    . sertraline (ZOLOFT) 100 MG tablet Take 100 mg by mouth at bedtime.    . sodium chloride (OCEAN) 0.65 %  SOLN nasal spray Place 1 spray into both nostrils as needed for congestion.    . traMADol (ULTRAM) 50 MG tablet TAKE ONE TABLET EVERY 6 HOURS AS NEEDED. 50 tablet 5  . valACYclovir (VALTREX) 1000 MG tablet   1  . valACYclovir (VALTREX) 500 MG tablet Take 1/2 tablet daily  for herpes simplex     No current facility-administered medications on file prior to visit.

## 2014-08-11 NOTE — Telephone Encounter (Signed)
Called and spoke to pt. Informed pt of the recs per SN. Rx sent to preferred pharmacy. Pt verbalized understanding and denied any further questions or concerns at this time.  

## 2014-08-11 NOTE — Telephone Encounter (Signed)
Per SN---  Rest Heating pad  Tramadol ok for prn use Call in prednisone dosepak

## 2014-08-18 ENCOUNTER — Ambulatory Visit (INDEPENDENT_AMBULATORY_CARE_PROVIDER_SITE_OTHER): Payer: Federal, State, Local not specified - PPO | Admitting: Family Medicine

## 2014-08-18 ENCOUNTER — Ambulatory Visit (INDEPENDENT_AMBULATORY_CARE_PROVIDER_SITE_OTHER): Payer: Federal, State, Local not specified - PPO

## 2014-08-18 VITALS — BP 122/66 | HR 74 | Temp 97.9°F | Resp 16 | Ht 66.25 in | Wt 129.0 lb

## 2014-08-18 DIAGNOSIS — S2231XA Fracture of one rib, right side, initial encounter for closed fracture: Secondary | ICD-10-CM

## 2014-08-18 DIAGNOSIS — R0781 Pleurodynia: Secondary | ICD-10-CM

## 2014-08-18 MED ORDER — MELOXICAM 7.5 MG PO TABS: 7.5000 mg | ORAL_TABLET | Freq: Every day | ORAL | Status: DC

## 2014-08-18 NOTE — Progress Notes (Addendum)
Subjective:  This chart was scribed for Reginia Forts, MD by Randa Evens, ED Scribe. This Patient was seen in room 09 and the patients care was started at 7:25 PM   Patient ID: Monique [name], female    DOB: 02/19/1956, 58 y.o.   MRN: [id number]  08/18/2014  Abdominal Pain  HPI HPI Comments: Monique [name] is a 58 y.o. female who presents to the [facility] complaining of burning right sided rib pain onset 2 weeks ago. She states she does have some intermittent cough, rhinorrhea and congestion. She states that the pain is worse with movement and inhaling. Pt states she did fall 3 weeks ago but is unsure if it is related to her pain today; pt fell on her L side and her rib pain is on the R side; no direct trauma to R ribs that pt recalls. Pt states she recently finished taking a prednisone taper and zpack with some relief; she contacted Dr. Nadel/PCP regarding cough, rib pain and prescribed these medications. Denies SOB, fever, chest pain, chills, sweats, rash, dysuria, abdominal pain, nausea or vomiting.   Pt states she has a Hx of pleurisy but states that this pain is not similar.      Review of Systems  Constitutional: Negative for fever, chills and diaphoresis.  HENT: Positive for congestion and rhinorrhea. Negative for ear pain.   Respiratory: Positive for cough. Negative for shortness of breath, wheezing and stridor.   Cardiovascular: Positive for chest pain (right sided rib pain). Negative for palpitations and leg swelling.  Gastrointestinal: Negative for nausea, vomiting, abdominal pain, diarrhea and constipation.  Genitourinary: Negative for dysuria, urgency, frequency, hematuria and flank pain.  Skin: Negative for rash.    Past Medical History  Diagnosis Date  . Allergic rhinitis   . Unspecified chronic bronchitis   . MVP (mitral valve prolapse)   . Abnormal electrocardiogram   . History of hepatitis C     Hep B also  . HSV infection   . Memory  loss   . Paresthesia   . Bipolar disorder   . Anxiety   . History of skin cancer   . Depression     Some   Past Surgical History  Procedure Laterality Date  . Rhinoplasty    . Tubal ligation      bilateral  . Appendectomy  11/08  . Mohs surgery     No Known Allergies Current Outpatient Prescriptions  Medication Sig Dispense Refill  . b complex vitamins tablet Take 1 tablet by mouth daily.    . Calcium Carbonate-Vit D-Min (CALCIUM 1200 PO) Take by mouth. Take 1 tab once daily.    . cetirizine (ZYRTEC) 10 MG tablet Take 10 mg by mouth daily.    . chlorpheniramine-HYDROcodone (TUSSIONEX PENNKINETIC ER) 10-8 MG/5ML LQCR Take 5 mLs by mouth every 12 (twelve) hours as needed for cough. 120 mL 0  . Cholecalciferol (VITAMIN D3) 1000 UNITS CHEW Chew 1 tablet by mouth 2 (two) times daily.    . fluticasone (FLONASE) 50 MCG/ACT nasal spray Place 2 sprays into both nostrils 2 (two) times daily. 16 g 11  . guaiFENesin (MUCINEX) 600 MG 12 hr tablet Take 1,200 mg by mouth 2 (two) times daily.    Marland Kitchen LORazepam (ATIVAN) 1 MG tablet Take 1-2 mg by mouth at bedtime. And as needed during the day    . metoprolol (LOPRESSOR) 50 MG tablet Take 1/2 to 1 tablet by mouth two times daily as directed 60  tablet 6  . Omega-3 Fatty Acids (FISH OIL) 1000 MG CAPS Take by mouth. Take 1 cap by mouth daily.    . sertraline (ZOLOFT) 100 MG tablet Take 100 mg by mouth at bedtime.    . sodium chloride (OCEAN) 0.65 % SOLN nasal spray Place 1 spray into both nostrils as needed for congestion.    . traMADol (ULTRAM) 50 MG tablet TAKE ONE TABLET EVERY 6 HOURS AS NEEDED. 50 tablet 5  . valACYclovir (VALTREX) 1000 MG tablet   1  . valACYclovir (VALTREX) 500 MG tablet Take 1/2 tablet daily  for herpes simplex    . meloxicam (MOBIC) 7.5 MG tablet Take 1 tablet (7.5 mg total) by mouth daily. 20 tablet 0   No current facility-administered medications for this visit.       Objective:    BP 122/66 mmHg  Pulse 74  Temp(Src)  97.9 F (36.6 C) (Oral)  Resp 16  Ht 5' 6.25" (1.683 m)  Wt 129 lb (58.514 kg)  BMI 20.66 kg/m2  SpO2 96%   Physical Exam  Constitutional: She is oriented to person, place, and time. She appears well-developed and well-nourished. No distress.  HENT:  Head: Normocephalic and atraumatic.  Eyes: Conjunctivae and EOM are normal. Pupils are equal, round, and reactive to light.  Neck: Normal range of motion. Neck supple. No thyromegaly present.  Painful with ROM in all directions.   Cardiovascular: Normal rate, regular rhythm, normal heart sounds and intact distal pulses.  Exam reveals no gallop and no friction rub.   No murmur heard. Pulmonary/Chest: Effort normal and breath sounds normal. No respiratory distress. She has no wheezes. She has no rales.  Abdominal: Soft. Bowel sounds are normal. She exhibits no distension and no mass. There is no tenderness. There is no rebound and no guarding.  Musculoskeletal: Normal range of motion.       Right shoulder: Normal. She exhibits normal range of motion, no tenderness and no pain.       Left shoulder: Normal. She exhibits normal range of motion, no tenderness, no pain and no spasm.       Cervical back: Normal. She exhibits normal range of motion, no tenderness, no bony tenderness and no pain.       Thoracic back: She exhibits tenderness and pain. She exhibits normal range of motion and no spasm.       Lumbar back: She exhibits pain. She exhibits normal range of motion, no tenderness and no bony tenderness.  Pain in right  lateral ribs with ROM lumbar spine; +TTP R lateral ribs.  No limitation of range of motion.    Lymphadenopathy:    She has no cervical adenopathy.  Neurological: She is alert and oriented to person, place, and time. No cranial nerve deficit. She exhibits normal muscle tone. Coordination normal.  Skin: Skin is warm and dry. No rash noted. She is not diaphoretic.  Psychiatric: She has a normal mood and affect. Her behavior is  normal.  Nursing note and vitals reviewed.     [facility] reading (PRIMARY) by  Dr. Tamala Julian.  R RIBS: NAD; +overread by radiologist revealed 6th rib fracture      Assessment & Plan:   1. Rib pain on right side   2. Right rib fracture, closed, initial encounter       1. R rib pain/R rib fracture 6th:  New.  With recent fall but no recall of direct trauma to R ribs.  Recommend rest, lifting avoidance.  Rx for Meloxicam provided.  Recommend follow-up with PCP in upcoming month for follow-up.  Good range of motion in office; pain not limiting movement.    Meds ordered this encounter  Medications  . meloxicam (MOBIC) 7.5 MG tablet    Sig: Take 1 tablet (7.5 mg total) by mouth daily.    Dispense:  20 tablet    Refill:  0    No Follow-up on file.     I personally performed the services described in this documentation, which was scribed in my presence. The recorded information has been reviewed and considered.   Reginia Forts, M.D.  [facility] [address] [place], [place]  [address] [phone] phone [phone] fax

## 2014-08-18 NOTE — Patient Instructions (Signed)
1. AVOID HEAVY LIFTING FOR TWO WEEKS.

## 2014-08-20 ENCOUNTER — Telehealth: Payer: Self-pay

## 2014-08-20 NOTE — Telephone Encounter (Signed)
-----   Message from Wardell Honour, MD sent at 08/19/2014  4:16 PM EST ----- Call --- radiologist did appreciate a rib fracture on the R rib that I did not see on xray.  Treatment includes rest and avoid heavy lifting for the next month.  It will take six weeks for the rib to completely heal.  Treatment includes rest and avoid heavy lifting for the next month.  It will take six weeks for the rib to completely heal.  We do NOT recommend a rib belt or taping/wrapping the ribs because this increases your risk of developing pneumonia.  I recommend you take frequent deep breaths throughout the day.  I also recommend you follow-up with Dr. Teressa Lower in the upcoming month.

## 2014-08-20 NOTE — Telephone Encounter (Signed)
Pt notified. Advised getting some Delsym and cough drops OTC to help with PND that she has so that she she doesn't cough

## 2014-12-15 ENCOUNTER — Ambulatory Visit (INDEPENDENT_AMBULATORY_CARE_PROVIDER_SITE_OTHER): Payer: Federal, State, Local not specified - PPO | Admitting: Internal Medicine

## 2014-12-15 VITALS — BP 108/62 | HR 101 | Temp 98.0°F | Resp 16 | Ht 67.0 in | Wt 131.0 lb

## 2014-12-15 DIAGNOSIS — J302 Other seasonal allergic rhinitis: Secondary | ICD-10-CM

## 2014-12-15 DIAGNOSIS — F32A Depression, unspecified: Secondary | ICD-10-CM

## 2014-12-15 DIAGNOSIS — Z8619 Personal history of other infectious and parasitic diseases: Secondary | ICD-10-CM

## 2014-12-15 DIAGNOSIS — F329 Major depressive disorder, single episode, unspecified: Secondary | ICD-10-CM

## 2014-12-15 NOTE — Patient Instructions (Signed)
We will not change your medicines today.   If you want zyrtec (cetirizine) is the strongest of the ones that you can buy over the counter.   Come back in about 1 year for a physical. If you have any problems sooner please feel free to call the office and come back sooner.   Health Maintenance Adopting a healthy lifestyle and getting preventive care can go a long way to promote health and wellness. Talk with your health care provider about what schedule of regular examinations is right for you. This is a good chance for you to check in with your provider about disease prevention and staying healthy. In between checkups, there are plenty of things you can do on your own. Experts have done a lot of research about which lifestyle changes and preventive measures are most likely to keep you healthy. Ask your health care provider for more information. WEIGHT AND DIET  Eat a healthy diet  Be sure to include plenty of vegetables, fruits, low-fat dairy products, and lean protein.  Do not eat a lot of foods high in solid fats, added sugars, or salt.  Get regular exercise. This is one of the most important things you can do for your health.  Most adults should exercise for at least 150 minutes each week. The exercise should increase your heart rate and make you sweat (moderate-intensity exercise).  Most adults should also do strengthening exercises at least twice a week. This is in addition to the moderate-intensity exercise.  Maintain a healthy weight  Body mass index (BMI) is a measurement that can be used to identify possible weight problems. It estimates body fat based on height and weight. Your health care provider can help determine your BMI and help you achieve or maintain a healthy weight.  For females 41 years of age and older:   A BMI below 18.5 is considered underweight.  A BMI of 18.5 to 24.9 is normal.  A BMI of 25 to 29.9 is considered overweight.  A BMI of 30 and above is  considered obese.  Watch levels of cholesterol and blood lipids  You should start having your blood tested for lipids and cholesterol at 60 years of age, then have this test every 5 years.  You may need to have your cholesterol levels checked more often if:  Your lipid or cholesterol levels are high.  You are older than 60 years of age.  You are at high risk for heart disease.  CANCER SCREENING   Lung Cancer  Lung cancer screening is recommended for adults 50-40 years old who are at high risk for lung cancer because of a history of smoking.  A yearly low-dose CT scan of the lungs is recommended for people who:  Currently smoke.  Have quit within the past 15 years.  Have at least a 30-pack-year history of smoking. A pack year is smoking an average of one pack of cigarettes a day for 1 year.  Yearly screening should continue until it has been 15 years since you quit.  Yearly screening should stop if you develop a health problem that would prevent you from having lung cancer treatment.  Breast Cancer  Practice breast self-awareness. This means understanding how your breasts normally appear and feel.  It also means doing regular breast self-exams. Let your health care provider know about any changes, no matter how small.  If you are in your 20s or 30s, you should have a clinical breast exam (CBE) by a  a health care provider every 1-3 years as part of a regular health exam.  If you are 40 or older, have a CBE every year. Also consider having a breast X-ray (mammogram) every year.  If you have a family history of breast cancer, talk to your health care provider about genetic screening.  If you are at high risk for breast cancer, talk to your health care provider about having an MRI and a mammogram every year.  Breast cancer gene (BRCA) assessment is recommended for women who have family members with BRCA-related cancers. BRCA-related cancers  include:  Breast.  Ovarian.  Tubal.  Peritoneal cancers.  Results of the assessment will determine the need for genetic counseling and BRCA1 and BRCA2 testing. Cervical Cancer Routine pelvic examinations to screen for cervical cancer are no longer recommended for nonpregnant women who are considered low risk for cancer of the pelvic organs (ovaries, uterus, and vagina) and who do not have symptoms. A pelvic examination may be necessary if you have symptoms including those associated with pelvic infections. Ask your health care provider if a screening pelvic exam is right for you.   The Pap test is the screening test for cervical cancer for women who are considered at risk.  If you had a hysterectomy for a problem that was not cancer or a condition that could lead to cancer, then you no longer need Pap tests.  If you are older than 65 years, and you have had normal Pap tests for the past 10 years, you no longer need to have Pap tests.  If you have had past treatment for cervical cancer or a condition that could lead to cancer, you need Pap tests and screening for cancer for at least 20 years after your treatment.  If you no longer get a Pap test, assess your risk factors if they change (such as having a new sexual partner). This can affect whether you should start being screened again.  Some women have medical problems that increase their chance of getting cervical cancer. If this is the case for you, your health care provider may recommend more frequent screening and Pap tests.  The human papillomavirus (HPV) test is another test that may be used for cervical cancer screening. The HPV test looks for the virus that can cause cell changes in the cervix. The cells collected during the Pap test can be tested for HPV.  The HPV test can be used to screen women 30 years of age and older. Getting tested for HPV can extend the interval between normal Pap tests from three to five years.  An HPV  test also should be used to screen women of any age who have unclear Pap test results.  After 60 years of age, women should have HPV testing as often as Pap tests.  Colorectal Cancer  This type of cancer can be detected and often prevented.  Routine colorectal cancer screening usually begins at 60 years of age and continues through 60 years of age.  Your health care provider may recommend screening at an earlier age if you have risk factors for colon cancer.  Your health care provider may also recommend using home test kits to check for hidden blood in the stool.  A small camera at the end of a tube can be used to examine your colon directly (sigmoidoscopy or colonoscopy). This is done to check for the earliest forms of colorectal cancer.  Routine screening usually begins at age 50.  Direct examination   of the colon should be repeated every 5-10 years through 60 years of age. However, you may need to be screened more often if early forms of precancerous polyps or small growths are found. Skin Cancer  Check your skin from head to toe regularly.  Tell your health care provider about any new moles or changes in moles, especially if there is a change in a mole's shape or color.  Also tell your health care provider if you have a mole that is larger than the size of a pencil eraser.  Always use sunscreen. Apply sunscreen liberally and repeatedly throughout the day.  Protect yourself by wearing long sleeves, pants, a wide-brimmed hat, and sunglasses whenever you are outside. HEART DISEASE, DIABETES, AND HIGH BLOOD PRESSURE   Have your blood pressure checked at least every 1-2 years. High blood pressure causes heart disease and increases the risk of stroke.  If you are between 55 years and 79 years old, ask your health care provider if you should take aspirin to prevent strokes.  Have regular diabetes screenings. This involves taking a blood sample to check your fasting blood sugar  level.  If you are at a normal weight and have a low risk for diabetes, have this test once every three years after 60 years of age.  If you are overweight and have a high risk for diabetes, consider being tested at a younger age or more often. PREVENTING INFECTION  Hepatitis B  If you have a higher risk for hepatitis B, you should be screened for this virus. You are considered at high risk for hepatitis B if:  You were born in a country where hepatitis B is common. Ask your health care provider which countries are considered high risk.  Your parents were born in a high-risk country, and you have not been immunized against hepatitis B (hepatitis B vaccine).  You have HIV or AIDS.  You use needles to inject street drugs.  You live with someone who has hepatitis B.  You have had sex with someone who has hepatitis B.  You get hemodialysis treatment.  You take certain medicines for conditions, including cancer, organ transplantation, and autoimmune conditions. Hepatitis C  Blood testing is recommended for:  Everyone born from 1945 through 1965.  Anyone with known risk factors for hepatitis C. Sexually transmitted infections (STIs)  You should be screened for sexually transmitted infections (STIs) including gonorrhea and chlamydia if:  You are sexually active and are younger than 60 years of age.  You are older than 60 years of age and your health care provider tells you that you are at risk for this type of infection.  Your sexual activity has changed since you were last screened and you are at an increased risk for chlamydia or gonorrhea. Ask your health care provider if you are at risk.  If you do not have HIV, but are at risk, it may be recommended that you take a prescription medicine daily to prevent HIV infection. This is called pre-exposure prophylaxis (PrEP). You are considered at risk if:  You are sexually active and do not regularly use condoms or know the HIV status  of your partner(s).  You take drugs by injection.  You are sexually active with a partner who has HIV. Talk with your health care provider about whether you are at high risk of being infected with HIV. If you choose to begin PrEP, you should first be tested for HIV. You should then be tested every   3 months for as long as you are taking PrEP.  PREGNANCY   If you are premenopausal and you may become pregnant, ask your health care provider about preconception counseling.  If you may become pregnant, take 400 to 800 micrograms (mcg) of folic acid every day.  If you want to prevent pregnancy, talk to your health care provider about birth control (contraception). OSTEOPOROSIS AND MENOPAUSE   Osteoporosis is a disease in which the bones lose minerals and strength with aging. This can result in serious bone fractures. Your risk for osteoporosis can be identified using a bone density scan.  If you are 65 years of age or older, or if you are at risk for osteoporosis and fractures, ask your health care provider if you should be screened.  Ask your health care provider whether you should take a calcium or vitamin D supplement to lower your risk for osteoporosis.  Menopause may have certain physical symptoms and risks.  Hormone replacement therapy may reduce some of these symptoms and risks. Talk to your health care provider about whether hormone replacement therapy is right for you.  HOME CARE INSTRUCTIONS   Schedule regular health, dental, and eye exams.  Stay current with your immunizations.   Do not use any tobacco products including cigarettes, chewing tobacco, or electronic cigarettes.  If you are pregnant, do not drink alcohol.  If you are breastfeeding, limit how much and how often you drink alcohol.  Limit alcohol intake to no more than 1 drink per day for nonpregnant women. One drink equals 12 ounces of beer, 5 ounces of wine, or 1 ounces of hard liquor.  Do not use street  drugs.  Do not share needles.  Ask your health care provider for help if you need support or information about quitting drugs.  Tell your health care provider if you often feel depressed.  Tell your health care provider if you have ever been abused or do not feel safe at home. Document Released: 04/01/2011 Document Revised: 01/31/2014 Document Reviewed: 08/18/2013 Exit[employer] Patient Information 2015 [employer]. This information is not intended to replace advice given to you by your health care provider. Make sure you discuss any questions you have with your health care provider.  

## 2014-12-15 NOTE — Progress Notes (Signed)
   Subjective:    Patient ID: Monique [name], female    DOB: [birth year], 59 y.o.   MRN: [id number]  HPI The patient is a 59 YO female who is coming in as a new patient. She is struggling with allergies and has been for the last 1-2 weeks. She is having some nose congestion. Denies facial tenderness, ear pain or pressure, fevers, chills, cough. She is taking claritin over the counter but wants to know if there is something stronger as it is not working well. Has not been a smoker in a long time.   PMH, [facility], social history reviewed and updated.   Review of Systems  Constitutional: Negative for fever, activity change, appetite change, fatigue and unexpected weight change.  HENT: Positive for congestion and rhinorrhea. Negative for ear discharge, ear pain, postnasal drip, sinus pressure, sore throat and trouble swallowing.   Eyes: Negative.   Respiratory: Negative for cough, chest tightness, shortness of breath and wheezing.   Cardiovascular: Negative for chest pain, palpitations and leg swelling.  Gastrointestinal: Negative for abdominal pain, diarrhea, constipation and abdominal distention.  Musculoskeletal: Negative.   Skin: Negative.   Neurological: Negative.   Psychiatric/Behavioral: Negative.       Objective:   Physical Exam  Constitutional: She is oriented to person, place, and time. She appears well-developed and well-nourished.  HENT:  Head: Normocephalic and atraumatic.  Right Ear: External ear normal.  Left Ear: External ear normal.  Nasal turbinates swollen, no crusting, oropharynx with minimal clear drainage and redness.   Eyes: EOM are normal.  Neck: Normal range of motion.  Cardiovascular: Normal rate and regular rhythm.   Pulmonary/Chest: Effort normal and breath sounds normal. No respiratory distress. She has no wheezes.  Abdominal: Soft. Bowel sounds are normal.  Musculoskeletal: She exhibits no edema.  Neurological: She is alert and oriented to person, place, and  time. Coordination normal.  Skin: Skin is warm and dry.  Psychiatric: She has a normal mood and affect.   Filed Vitals:   12/15/14 1441  BP: 108/62  Pulse: 101  Temp: 98 F (36.7 C)  TempSrc: Oral  Resp: 16  Height: 5\' 7"  (1.702 m)  Weight: 131 lb (59.421 kg)  SpO2: 97%      Assessment & Plan:

## 2014-12-16 ENCOUNTER — Encounter: Payer: Self-pay | Admitting: Internal Medicine

## 2014-12-16 NOTE — Assessment & Plan Note (Signed)
Doing well on zoloft, no change needed today.

## 2014-12-16 NOTE — Assessment & Plan Note (Signed)
Underwent treatment in 2002 and no problems with LFTs since that time. Will monitor every year.

## 2014-12-16 NOTE — Assessment & Plan Note (Signed)
Advised that zyrtec is the strongest of the OTC medications. She will try and if no improvement call us back. No indication for antibiotics at this time.

## 2015-04-04 ENCOUNTER — Other Ambulatory Visit: Payer: Self-pay | Admitting: Pulmonary Disease

## 2015-07-13 ENCOUNTER — Encounter: Payer: Self-pay | Admitting: Family

## 2015-07-13 ENCOUNTER — Ambulatory Visit (INDEPENDENT_AMBULATORY_CARE_PROVIDER_SITE_OTHER): Payer: Federal, State, Local not specified - PPO | Admitting: Family

## 2015-07-13 VITALS — BP 104/78 | HR 68 | Temp 97.8°F | Resp 18 | Ht 66.25 in | Wt 125.4 lb

## 2015-07-13 DIAGNOSIS — J0100 Acute maxillary sinusitis, unspecified: Secondary | ICD-10-CM

## 2015-07-13 DIAGNOSIS — J019 Acute sinusitis, unspecified: Secondary | ICD-10-CM | POA: Insufficient documentation

## 2015-07-13 MED ORDER — HYDROCOD POLST-CPM POLST ER 10-8 MG/5ML PO SUER: 5.0000 mL | Freq: Every evening | ORAL | Status: DC | PRN

## 2015-07-13 MED ORDER — FLUCONAZOLE 150 MG PO TABS: 150.0000 mg | ORAL_TABLET | Freq: Once | ORAL | Status: DC

## 2015-07-13 MED ORDER — AMOXICILLIN-POT CLAVULANATE 875-125 MG PO TABS: 1.0000 | ORAL_TABLET | Freq: Two times a day (BID) | ORAL | Status: DC

## 2015-07-13 NOTE — Patient Instructions (Signed)
Thank you for choosing [facility].  Summary/Instructions:  Your prescription(s) have been submitted to your pharmacy or been printed and provided for you. Please take as directed and contact our office if you believe you are having problem(s) with the medication(s) or have any questions.  If your symptoms worsen or fail to improve, please contact our office for further instruction, or in case of emergency go directly to the emergency room at the closest medical facility.   General Recommendations:    Please drink plenty of fluids.  Get plenty of rest   Sleep in humidified air  Use saline nasal sprays  Netti pot   OTC Medications:  Decongestants - helps relieve congestion   Flonase (generic fluticasone) or Nasacort (generic triamcinolone) - please make sure to use the "cross-over" technique at a 45 degree angle towards the opposite eye as opposed to straight up the nasal passageway.   Sudafed (generic pseudoephedrine - Note this is the one that is available behind the pharmacy counter); Products with phenylephrine (-PE) may also be used but is often not as effective as pseudoephedrine.   If you have HIGH BLOOD PRESSURE - Coricidin HBP; AVOID any product that is -D as this contains pseudoephedrine which may increase your blood pressure.  Afrin (oxymetazoline) every 6-8 hours for up to 3 days.   Allergies - helps relieve runny nose, itchy eyes and sneezing   Claritin (generic loratidine), Allegra (fexofenidine), or Zyrtec (generic cyrterizine) for runny nose. These medications should not cause drowsiness.  Note - Benadryl (generic diphenhydramine) may be used however may cause drowsiness  Cough -   Delsym or Robitussin (generic dextromethorphan)  Expectorants - helps loosen mucus to ease removal   Mucinex (generic guaifenesin) as directed on the package.  Headaches / General Aches   Tylenol (generic acetaminophen) - DO NOT EXCEED 3 grams (3,000 mg) in a 24  hour time period  Advil/Motrin (generic ibuprofen)   Sore Throat -   Salt water gargle   Chloraseptic (generic benzocaine) spray or lozenges / Sucrets (generic dyclonine)      

## 2015-07-13 NOTE — Progress Notes (Signed)
Subjective:    Patient ID: Monique [name], female    DOB: 06/14/1956, 59 y.o.   MRN: [id number]  Chief Complaint  Patient presents with  . Sore Throat    over a week has had a sore throat, congestion, throat feels inflammed, fatigue and cough    HPI:  Monique [name] is a 59 y.o. female who  has a past medical history of Allergic rhinitis; Unspecified chronic bronchitis (HCC); MVP (mitral valve prolapse); Abnormal electrocardiogram; History of hepatitis C; HSV infection; Memory loss; Paresthesia; Bipolar disorder ([place]); Anxiety; History of skin cancer; and Depression. and presents today for an acute office visit.  Associated symptom of sore throat, congestion, fatigue and cough has been going on for about 1 week. Modifying factors include Mucinex and chicken soup which has not provided much relief. Denies any recent antibiotics.   No Known Allergies   Current Outpatient Prescriptions on File Prior to Visit  Medication Sig Dispense Refill  . b complex vitamins tablet Take 1 tablet by mouth daily.    . Calcium Carbonate-Vit D-Min (CALCIUM 1200 PO) Take by mouth. Take 1 tab once daily.    . cetirizine (ZYRTEC) 10 MG tablet Take 10 mg by mouth daily.    . Cholecalciferol (VITAMIN D3) 1000 UNITS CHEW Chew 1 tablet by mouth 2 (two) times daily.    . fluticasone (FLONASE) 50 MCG/ACT nasal spray Place 2 sprays into both nostrils 2 (two) times daily. 16 g 11  . guaiFENesin (MUCINEX) 600 MG 12 hr tablet Take 1,200 mg by mouth 2 (two) times daily.    Marland Kitchen LORazepam (ATIVAN) 1 MG tablet Take 1-2 mg by mouth at bedtime. And as needed during the day    . metoprolol (LOPRESSOR) 50 MG tablet Take 1/2 to 1 tablet by mouth two times daily as directed 60 tablet 6  . Omega-3 Fatty Acids (FISH OIL) 1000 MG CAPS Take by mouth. Take 1 cap by mouth daily.    . sertraline (ZOLOFT) 100 MG tablet Take 100 mg by mouth at bedtime.    . sodium chloride (OCEAN) 0.65 % SOLN nasal spray Place 1 spray into both  nostrils as needed for congestion.    . traMADol (ULTRAM) 50 MG tablet TAKE ONE TABLET EVERY 6 HOURS AS NEEDED. 50 tablet 0  . valACYclovir (VALTREX) 1000 MG tablet   1  . valACYclovir (VALTREX) 500 MG tablet Take 1/2 tablet daily  for herpes simplex     No current facility-administered medications on file prior to visit.    Review of Systems  Constitutional: Positive for fatigue. Negative for fever and chills.  HENT: Positive for congestion, ear pain, sinus pressure and sore throat.   Respiratory: Negative for chest tightness and shortness of breath.   Cardiovascular: Negative for chest pain, palpitations and leg swelling.      Objective:    BP 104/78 mmHg  Pulse 68  Temp(Src) 97.8 F (36.6 C) (Oral)  Resp 18  Ht 5' 6.25" (1.683 m)  Wt 125 lb 6.4 oz (56.881 kg)  BMI 20.08 kg/m2  SpO2 98% Nursing note and vital signs reviewed.  Physical Exam  Constitutional: She is oriented to person, place, and time. She appears well-developed and well-nourished. No distress.  HENT:  Right Ear: Hearing, tympanic membrane, external ear and ear canal normal.  Left Ear: Hearing, tympanic membrane, external ear and ear canal normal.  Nose: Right sinus exhibits maxillary sinus tenderness. Left sinus exhibits maxillary sinus tenderness.  Mouth/Throat: Uvula is midline and mucous  membranes are normal. Posterior oropharyngeal edema present.  Cardiovascular: Normal rate, regular rhythm, normal heart sounds and intact distal pulses.   Pulmonary/Chest: Effort normal and breath sounds normal.  Neurological: She is alert and oriented to person, place, and time.  Skin: Skin is warm and dry.  Psychiatric: She has a normal mood and affect. Her behavior is normal. Judgment and thought content normal.       Assessment & Plan:   Problem List Items Addressed This Visit      Respiratory   Sinusitis, acute - Primary    Symptoms and exam consistent with sinusitis. Start augmentin. Start Tussionex as needed  for cough and sleep. Start diflucan as needed for postantibiotic candidiasis as needed. Continue over the counter medication as needed for symptom relief and supportive care. Follow up for symptom worsening or no improvement.       Relevant Medications   amoxicillin-clavulanate (AUGMENTIN) 875-125 MG tablet   fluconazole (DIFLUCAN) 150 MG tablet   chlorpheniramine-HYDROcodone (TUSSIONEX PENNKINETIC ER) 10-8 MG/5ML SUER

## 2015-07-13 NOTE — Assessment & Plan Note (Signed)
Symptoms and exam consistent with sinusitis. Start augmentin. Start Tussionex as needed for cough and sleep. Start diflucan as needed for postantibiotic candidiasis as needed. Continue over the counter medication as needed for symptom relief and supportive care. Follow up for symptom worsening or no improvement.

## 2015-07-13 NOTE — Progress Notes (Signed)
Pre visit review using our clinic review tool, if applicable. No additional management support is needed unless otherwise documented below in the visit note. 

## 2015-07-31 ENCOUNTER — Other Ambulatory Visit: Payer: Self-pay | Admitting: Pulmonary Disease

## 2015-07-31 ENCOUNTER — Ambulatory Visit: Payer: Federal, State, Local not specified - PPO | Admitting: Pulmonary Disease

## 2015-07-31 ENCOUNTER — Other Ambulatory Visit: Payer: Self-pay | Admitting: Family

## 2015-08-01 ENCOUNTER — Encounter: Payer: Self-pay | Admitting: Internal Medicine

## 2015-08-02 MED ORDER — FLUTICASONE PROPIONATE 50 MCG/ACT NA SUSP: 2.0000 | Freq: Two times a day (BID) | NASAL | Status: DC

## 2015-08-02 MED ORDER — TRAMADOL HCL 50 MG PO TABS: 50.0000 mg | ORAL_TABLET | Freq: Every day | ORAL | Status: DC | PRN

## 2015-10-03 ENCOUNTER — Ambulatory Visit (INDEPENDENT_AMBULATORY_CARE_PROVIDER_SITE_OTHER): Payer: Federal, State, Local not specified - PPO | Admitting: Family

## 2015-10-03 ENCOUNTER — Encounter: Payer: Self-pay | Admitting: Family

## 2015-10-03 VITALS — BP 110/80 | HR 64 | Temp 98.0°F | Resp 14 | Ht 66.25 in | Wt 129.8 lb

## 2015-10-03 DIAGNOSIS — J0100 Acute maxillary sinusitis, unspecified: Secondary | ICD-10-CM

## 2015-10-03 MED ORDER — HYDROCOD POLST-CPM POLST ER 10-8 MG/5ML PO SUER: 5.0000 mL | Freq: Every evening | ORAL | Status: DC | PRN

## 2015-10-03 MED ORDER — METHYLPREDNISOLONE ACETATE 80 MG/ML IJ SUSP
80.0000 mg | Freq: Once | INTRAMUSCULAR | Status: AC
  Administered 2015-10-03: 80 mg via INTRAMUSCULAR

## 2015-10-03 MED ORDER — FLUCONAZOLE 150 MG PO TABS: 150.0000 mg | ORAL_TABLET | Freq: Once | ORAL | Status: DC

## 2015-10-03 MED ORDER — AMOXICILLIN-POT CLAVULANATE 875-125 MG PO TABS: 1.0000 | ORAL_TABLET | Freq: Two times a day (BID) | ORAL | Status: DC

## 2015-10-03 NOTE — Progress Notes (Signed)
Pre visit review using our clinic review tool, if applicable. No additional management support is needed unless otherwise documented below in the visit note. 

## 2015-10-03 NOTE — Progress Notes (Signed)
Subjective:    Patient ID: Monique [name], female    DOB: 02-23-1956, 60 y.o.   MRN: [id number]  Chief Complaint  Patient presents with  . Nasal Congestion    Stuffy nose, drainage, sneezing, feels sinus pressure in head    HPI:  Monique [name] is a 60 y.o. female who  has a past medical history of Allergic rhinitis; Unspecified chronic bronchitis (HCC); MVP (mitral valve prolapse); Abnormal electrocardiogram; History of hepatitis C; HSV infection; Memory loss; Paresthesia; Bipolar disorder ([place]); Anxiety; History of skin cancer; and Depression. and presents today for an acute office visit.  This is a new problem. Associated symptoms of stuffy nose, drainage, sneezing, cough and sinus pressure have been going on for approximately 9 days. Modifying factors include broth and OTC medications which has not helped very much. Denies fevers. Severity of the cough is enough to disturb her sleep on occasion. Course of of the symptoms has improved slightly but her sinuses has worsened. Denies any antibiotic use recently.   No Known Allergies   Current Outpatient Prescriptions on File Prior to Visit  Medication Sig Dispense Refill  . b complex vitamins tablet Take 1 tablet by mouth daily.    . Calcium Carbonate-Vit D-Min (CALCIUM 1200 PO) Take by mouth. Take 1 tab once daily.    . cetirizine (ZYRTEC) 10 MG tablet Take 10 mg by mouth daily.    . Cholecalciferol (VITAMIN D3) 1000 UNITS CHEW Chew 1 tablet by mouth 2 (two) times daily.    . fluticasone (FLONASE) 50 MCG/ACT nasal spray Place 2 sprays into both nostrils 2 (two) times daily. 16 g 11  . guaiFENesin (MUCINEX) 600 MG 12 hr tablet Take 1,200 mg by mouth 2 (two) times daily.    Marland Kitchen LORazepam (ATIVAN) 1 MG tablet Take 1-2 mg by mouth at bedtime. And as needed during the day    . metoprolol (LOPRESSOR) 50 MG tablet Take 1/2 to 1 tablet by mouth two times daily as directed 60 tablet 6  . Omega-3 Fatty Acids (FISH OIL) 1000 MG CAPS Take by  mouth. Take 1 cap by mouth daily.    . sertraline (ZOLOFT) 100 MG tablet Take 100 mg by mouth at bedtime.    . sodium chloride (OCEAN) 0.65 % SOLN nasal spray Place 1 spray into both nostrils as needed for congestion.    . traMADol (ULTRAM) 50 MG tablet Take 1 tablet (50 mg total) by mouth daily as needed. 15 tablet 0  . valACYclovir (VALTREX) 1000 MG tablet   1  . valACYclovir (VALTREX) 500 MG tablet Take 1/2 tablet daily  for herpes simplex     No current facility-administered medications on file prior to visit.    Review of Systems  Constitutional: Negative for fever and chills.  HENT: Positive for congestion, sinus pressure and sneezing.   Respiratory: Positive for cough. Negative for chest tightness and shortness of breath.   Neurological: Positive for headaches.      Objective:    BP 110/80 mmHg  Pulse 64  Temp(Src) 98 F (36.7 C) (Oral)  Resp 14  Ht 5' 6.25" (1.683 m)  Wt 129 lb 12.8 oz (58.877 kg)  BMI 20.79 kg/m2  SpO2 98% Nursing note and vital signs reviewed.  Physical Exam  Constitutional: She is oriented to person, place, and time. She appears well-developed and well-nourished. No distress.  HENT:  Right Ear: Hearing, tympanic membrane, external ear and ear canal normal.  Left Ear: Hearing, tympanic membrane, external ear and  ear canal normal.  Nose: Right sinus exhibits maxillary sinus tenderness and frontal sinus tenderness. Left sinus exhibits maxillary sinus tenderness and frontal sinus tenderness.  Mouth/Throat: Uvula is midline, oropharynx is clear and moist and mucous membranes are normal.  Neck: Neck supple.  Cardiovascular: Normal rate, regular rhythm, normal heart sounds and intact distal pulses.   Pulmonary/Chest: Effort normal and breath sounds normal.  Neurological: She is alert and oriented to person, place, and time.  Skin: Skin is warm and dry.  Psychiatric: She has a normal mood and affect. Her behavior is normal. Judgment and thought content  normal.       Assessment & Plan:   Problem List Items Addressed This Visit      Respiratory   Sinusitis, acute - Primary    Symptoms and exam consistent with acute maxillary sinusitis. Start Augmentin. Start Tussionex as needed for cough and sleep. In office injection Depo-Medrol provided. Start Diflucan as needed for post antibiotic candidiasis. Continue over-the-counter medications as needed for symptom relief and supportive care. Follow-up if symptoms worsen or fail to improve.      Relevant Medications   chlorpheniramine-HYDROcodone (TUSSIONEX PENNKINETIC ER) 10-8 MG/5ML SUER   amoxicillin-clavulanate (AUGMENTIN) 875-125 MG tablet   fluconazole (DIFLUCAN) 150 MG tablet

## 2015-10-03 NOTE — Patient Instructions (Signed)
Thank you for choosing [facility].  Summary/Instructions:  Your prescription(s) have been submitted to your pharmacy or been printed and provided for you. Please take as directed and contact our office if you believe you are having problem(s) with the medication(s) or have any questions.  If your symptoms worsen or fail to improve, please contact our office for further instruction, or in case of emergency go directly to the emergency room at the closest medical facility.   General Recommendations:    Please drink plenty of fluids.  Get plenty of rest   Sleep in humidified air  Use saline nasal sprays  Netti pot   OTC Medications:  Decongestants - helps relieve congestion   Flonase (generic fluticasone) or Nasacort (generic triamcinolone) - please make sure to use the "cross-over" technique at a 45 degree angle towards the opposite eye as opposed to straight up the nasal passageway.   Sudafed (generic pseudoephedrine - Note this is the one that is available behind the pharmacy counter); Products with phenylephrine (-PE) may also be used but is often not as effective as pseudoephedrine.   If you have HIGH BLOOD PRESSURE - Coricidin HBP; AVOID any product that is -D as this contains pseudoephedrine which may increase your blood pressure.  Afrin (oxymetazoline) every 6-8 hours for up to 3 days.   Allergies - helps relieve runny nose, itchy eyes and sneezing   Claritin (generic loratidine), Allegra (fexofenidine), or Zyrtec (generic cyrterizine) for runny nose. These medications should not cause drowsiness.  Note - Benadryl (generic diphenhydramine) may be used however may cause drowsiness  Cough -   Delsym or Robitussin (generic dextromethorphan)  Expectorants - helps loosen mucus to ease removal   Mucinex (generic guaifenesin) as directed on the package.  Headaches / General Aches   Tylenol (generic acetaminophen) - DO NOT EXCEED 3 grams (3,000 mg) in a 24  hour time period  Advil/Motrin (generic ibuprofen)   Sore Throat -   Salt water gargle   Chloraseptic (generic benzocaine) spray or lozenges / Sucrets (generic dyclonine)    Sinusitis Sinusitis is redness, soreness, and inflammation of the paranasal sinuses. Paranasal sinuses are air pockets within the bones of your face (beneath the eyes, the middle of the forehead, or above the eyes). In healthy paranasal sinuses, mucus is able to drain out, and air is able to circulate through them by way of your nose. However, when your paranasal sinuses are inflamed, mucus and air can become trapped. This can allow bacteria and other germs to grow and cause infection. Sinusitis can develop quickly and last only a short time (acute) or continue over a long period (chronic). Sinusitis that lasts for more than 12 weeks is considered chronic.  CAUSES  Causes of sinusitis include:  Allergies.  Structural abnormalities, such as displacement of the cartilage that separates your nostrils (deviated septum), which can decrease the air flow through your nose and sinuses and affect sinus drainage.  Functional abnormalities, such as when the small hairs (cilia) that line your sinuses and help remove mucus do not work properly or are not present. SIGNS AND SYMPTOMS  Symptoms of acute and chronic sinusitis are the same. The primary symptoms are pain and pressure around the affected sinuses. Other symptoms include:  Upper toothache.  Earache.  Headache.  Bad breath.  Decreased sense of smell and taste.  A cough, which worsens when you are lying flat.  Fatigue.  Fever.  Thick drainage from your nose, which often is green and may   contain pus (purulent).  Swelling and warmth over the affected sinuses. DIAGNOSIS  Your health care provider will perform a physical exam. During the exam, your health care provider may:  Look in your nose for signs of abnormal growths in your nostrils (nasal  polyps).  Tap over the affected sinus to check for signs of infection.  View the inside of your sinuses (endoscopy) using an imaging device that has a light attached (endoscope). If your health care provider suspects that you have chronic sinusitis, one or more of the following tests may be recommended:  Allergy tests.  Nasal culture. A sample of mucus is taken from your nose, sent to a lab, and screened for bacteria.  Nasal cytology. A sample of mucus is taken from your nose and examined by your health care provider to determine if your sinusitis is related to an allergy. TREATMENT  Most cases of acute sinusitis are related to a viral infection and will resolve on their own within 10 days. Sometimes medicines are prescribed to help relieve symptoms (pain medicine, decongestants, nasal steroid sprays, or saline sprays).  However, for sinusitis related to a bacterial infection, your health care provider will prescribe antibiotic medicines. These are medicines that will help kill the bacteria causing the infection.  Rarely, sinusitis is caused by a fungal infection. In theses cases, your health care provider will prescribe antifungal medicine. For some cases of chronic sinusitis, surgery is needed. Generally, these are cases in which sinusitis recurs more than 3 times per year, despite other treatments. HOME CARE INSTRUCTIONS   Drink plenty of water. Water helps thin the mucus so your sinuses can drain more easily.  Use a humidifier.  Inhale steam 3 to 4 times a day (for example, sit in the bathroom with the shower running).  Apply a warm, moist washcloth to your face 3 to 4 times a day, or as directed by your health care provider.  Use saline nasal sprays to help moisten and clean your sinuses.  Take medicines only as directed by your health care provider.  If you were prescribed either an antibiotic or antifungal medicine, finish it all even if you start to feel better. SEEK IMMEDIATE  MEDICAL CARE IF:  You have increasing pain or severe headaches.  You have nausea, vomiting, or drowsiness.  You have swelling around your face.  You have vision problems.  You have a stiff neck.  You have difficulty breathing. MAKE SURE YOU:   Understand these instructions.  Will watch your condition.  Will get help right away if you are not doing well or get worse. Document Released: 09/16/2005 Document Revised: 01/31/2014 Document Reviewed: 10/01/2011 Exit[employer] Patient Information 2015 [employer]. This information is not intended to replace advice given to you by your health care provider. Make sure you discuss any questions you have with your health care provider.   

## 2015-10-03 NOTE — Assessment & Plan Note (Signed)
Symptoms and exam consistent with acute maxillary sinusitis. Start Augmentin. Start Tussionex as needed for cough and sleep. In office injection Depo-Medrol provided. Start Diflucan as needed for post antibiotic candidiasis. Continue over-the-counter medications as needed for symptom relief and supportive care. Follow-up if symptoms worsen or fail to improve.

## 2015-10-19 ENCOUNTER — Other Ambulatory Visit: Payer: Self-pay | Admitting: Internal Medicine

## 2015-12-04 ENCOUNTER — Telehealth: Payer: Self-pay | Admitting: Internal Medicine

## 2015-12-04 MED ORDER — TRAMADOL HCL 50 MG PO TABS: 50.0000 mg | ORAL_TABLET | Freq: Every day | ORAL | Status: DC | PRN

## 2015-12-04 NOTE — Telephone Encounter (Signed)
Sent to pharmacy 

## 2015-12-19 ENCOUNTER — Other Ambulatory Visit (INDEPENDENT_AMBULATORY_CARE_PROVIDER_SITE_OTHER): Payer: Federal, State, Local not specified - PPO

## 2015-12-19 ENCOUNTER — Ambulatory Visit (INDEPENDENT_AMBULATORY_CARE_PROVIDER_SITE_OTHER): Payer: Federal, State, Local not specified - PPO | Admitting: Internal Medicine

## 2015-12-19 ENCOUNTER — Encounter: Payer: Self-pay | Admitting: Internal Medicine

## 2015-12-19 VITALS — BP 100/82 | HR 67 | Temp 98.1°F | Resp 16 | Ht 67.0 in | Wt 126.0 lb

## 2015-12-19 DIAGNOSIS — J302 Other seasonal allergic rhinitis: Secondary | ICD-10-CM | POA: Diagnosis not present

## 2015-12-19 DIAGNOSIS — Z0000 Encounter for general adult medical examination without abnormal findings: Secondary | ICD-10-CM

## 2015-12-19 LAB — CBC
HCT: 37.1 % (ref 36.0–46.0)
HEMOGLOBIN: 12.5 g/dL (ref 12.0–15.0)
MCHC: 33.7 g/dL (ref 30.0–36.0)
MCV: 89.8 fl (ref 78.0–100.0)
PLATELETS: 206 10*3/uL (ref 150.0–400.0)
RBC: 4.13 Mil/uL (ref 3.87–5.11)
RDW: 14 % (ref 11.5–15.5)
WBC: 6.5 10*3/uL (ref 4.0–10.5)

## 2015-12-19 LAB — COMPREHENSIVE METABOLIC PANEL
ALBUMIN: 4.2 g/dL (ref 3.5–5.2)
ALT: 14 U/L (ref 0–35)
AST: 19 U/L (ref 0–37)
Alkaline Phosphatase: 64 U/L (ref 39–117)
BUN: 16 mg/dL (ref 6–23)
CHLORIDE: 106 meq/L (ref 96–112)
CO2: 30 meq/L (ref 19–32)
CREATININE: 0.71 mg/dL (ref 0.40–1.20)
Calcium: 9.4 mg/dL (ref 8.4–10.5)
GFR: 89.18 mL/min (ref >60.00)
GLUCOSE: 84 mg/dL (ref 70–99)
POTASSIUM: 4.6 meq/L (ref 3.5–5.1)
SODIUM: 140 meq/L (ref 135–145)
Total Bilirubin: 0.4 mg/dL (ref 0.2–1.2)
Total Protein: 7.1 g/dL (ref 6.0–8.3)

## 2015-12-19 LAB — LIPID PANEL
Cholesterol: 181 mg/dL (ref 0–200)
HDL: 73.7 mg/dL (ref >39.00)
LDL CALC: 96 mg/dL (ref 0–99)
NONHDL: 106.96
Total CHOL/HDL Ratio: 2
Triglycerides: 56 mg/dL (ref 0.0–149.0)
VLDL: 11.2 mg/dL (ref 0.0–40.0)

## 2015-12-19 MED ORDER — MONTELUKAST SODIUM 10 MG PO TABS: 10.0000 mg | ORAL_TABLET | Freq: Every day | ORAL | Status: DC

## 2015-12-19 NOTE — Progress Notes (Signed)
   Subjective:    Patient ID: Monique [name], female    DOB: Mar 25, 1956, 60 y.o.   MRN: [id number]  HPI The patient is a 60 YO female coming in for wellness. Some concerns with her sinuses still. Treated for acute infection this year already.   PMH, [facility], social history reviewed and updated.   Review of Systems  Constitutional: Negative for fever, activity change, appetite change, fatigue and unexpected weight change.  HENT: Positive for congestion, rhinorrhea and sinus pressure. Negative for ear discharge, ear pain, postnasal drip, sore throat and trouble swallowing.   Eyes: Negative.   Respiratory: Negative for cough, chest tightness, shortness of breath and wheezing.   Cardiovascular: Negative for chest pain, palpitations and leg swelling.  Gastrointestinal: Negative for abdominal pain, diarrhea, constipation and abdominal distention.  Musculoskeletal: Negative.   Skin: Negative.   Neurological: Negative.   Psychiatric/Behavioral: Negative.       Objective:   Physical Exam  Constitutional: She is oriented to person, place, and time. She appears well-developed and well-nourished.  HENT:  Head: Normocephalic and atraumatic.  Right Ear: External ear normal.  Left Ear: External ear normal.  Oropharynx with minimal clear drainage and redness.   Eyes: EOM are normal.  Neck: Normal range of motion. No thyromegaly present.  Cardiovascular: Normal rate and regular rhythm.   Pulmonary/Chest: Effort normal and breath sounds normal. No respiratory distress. She has no wheezes.  Abdominal: Soft. Bowel sounds are normal.  Musculoskeletal: She exhibits no edema.  Lymphadenopathy:    She has no cervical adenopathy.  Neurological: She is alert and oriented to person, place, and time. Coordination normal.  Skin: Skin is warm and dry.  Psychiatric: She has a normal mood and affect.   Filed Vitals:   12/19/15 1007  BP: 100/82  Pulse: 67  Temp: 98.1 F (36.7 C)  TempSrc: Oral  Resp: 16   Height: 5\' 7"  (1.702 m)  Weight: 126 lb (57.153 kg)  SpO2: 97%      Assessment & Plan:

## 2015-12-19 NOTE — Assessment & Plan Note (Signed)
Added singulair as zyrtec is not enough for her allergies. She has not done well with nasal corticosteroids in the past.

## 2015-12-19 NOTE — Progress Notes (Signed)
Pre visit review using our clinic review tool, if applicable. No additional management support is needed unless otherwise documented below in the visit note. 

## 2015-12-19 NOTE — Assessment & Plan Note (Signed)
Checking labs today, immunizations up to date including flu. Colonoscopy and mammogram and pap smear up to date. Counseled about sun safety and mole surveillance.

## 2015-12-19 NOTE — Patient Instructions (Signed)
We will check the labs and the urine today and send the results on mychart.   We have sent in singulair and I would recommend to take it with the zyrtec for the first week then just take the singulair. It is safe to take both during your worst allergy seasons if needed.  Health Maintenance, Female Adopting a healthy lifestyle and getting preventive care can go a long way to promote health and wellness. Talk with your health care provider about what schedule of regular examinations is right for you. This is a good chance for you to check in with your provider about disease prevention and staying healthy. In between checkups, there are plenty of things you can do on your own. Experts have done a lot of research about which lifestyle changes and preventive measures are most likely to keep you healthy. Ask your health care provider for more information. WEIGHT AND DIET  Eat a healthy diet  Be sure to include plenty of vegetables, fruits, low-fat dairy products, and lean protein.  Do not eat a lot of foods high in solid fats, added sugars, or salt.  Get regular exercise. This is one of the most important things you can do for your health.  Most adults should exercise for at least 150 minutes each week. The exercise should increase your heart rate and make you sweat (moderate-intensity exercise).  Most adults should also do strengthening exercises at least twice a week. This is in addition to the moderate-intensity exercise.  Maintain a healthy weight  Body mass index (BMI) is a measurement that can be used to identify possible weight problems. It estimates body fat based on height and weight. Your health care provider can help determine your BMI and help you achieve or maintain a healthy weight.  For females 23 years of age and older:   A BMI below 18.5 is considered underweight.  A BMI of 18.5 to 24.9 is normal.  A BMI of 25 to 29.9 is considered overweight.  A BMI of 30 and above is  considered obese.  Watch levels of cholesterol and blood lipids  You should start having your blood tested for lipids and cholesterol at 61 years of age, then have this test every 5 years.  You may need to have your cholesterol levels checked more often if:  Your lipid or cholesterol levels are high.  You are older than 61 years of age.  You are at high risk for heart disease.  CANCER SCREENING   Lung Cancer  Lung cancer screening is recommended for adults 30-52 years old who are at high risk for lung cancer because of a history of smoking.  A yearly low-dose CT scan of the lungs is recommended for people who:  Currently smoke.  Have quit within the past 15 years.  Have at least a 30-pack-year history of smoking. A pack year is smoking an average of one pack of cigarettes a day for 1 year.  Yearly screening should continue until it has been 15 years since you quit.  Yearly screening should stop if you develop a health problem that would prevent you from having lung cancer treatment.  Breast Cancer  Practice breast self-awareness. This means understanding how your breasts normally appear and feel.  It also means doing regular breast self-exams. Let your health care provider know about any changes, no matter how small.  If you are in your 20s or 30s, you should have a clinical breast exam (CBE) by a health  care provider every 1-3 years as part of a regular health exam.  If you are 34 or older, have a CBE every year. Also consider having a breast X-ray (mammogram) every year.  If you have a family history of breast cancer, talk to your health care provider about genetic screening.  If you are at high risk for breast cancer, talk to your health care provider about having an MRI and a mammogram every year.  Breast cancer gene (BRCA) assessment is recommended for women who have family members with BRCA-related cancers. BRCA-related cancers  include:  Breast.  Ovarian.  Tubal.  Peritoneal cancers.  Results of the assessment will determine the need for genetic counseling and BRCA1 and BRCA2 testing. Cervical Cancer Your health care provider may recommend that you be screened regularly for cancer of the pelvic organs (ovaries, uterus, and vagina). This screening involves a pelvic examination, including checking for microscopic changes to the surface of your cervix (Pap test). You may be encouraged to have this screening done every 3 years, beginning at age 21.  For women ages 89-65, health care providers may recommend pelvic exams and Pap testing every 3 years, or they may recommend the Pap and pelvic exam, combined with testing for human papilloma virus (HPV), every 5 years. Some types of HPV increase your risk of cervical cancer. Testing for HPV may also be done on women of any age with unclear Pap test results.  Other health care providers may not recommend any screening for nonpregnant women who are considered low risk for pelvic cancer and who do not have symptoms. Ask your health care provider if a screening pelvic exam is right for you.  If you have had past treatment for cervical cancer or a condition that could lead to cancer, you need Pap tests and screening for cancer for at least 20 years after your treatment. If Pap tests have been discontinued, your risk factors (such as having a new sexual partner) need to be reassessed to determine if screening should resume. Some women have medical problems that increase the chance of getting cervical cancer. In these cases, your health care provider may recommend more frequent screening and Pap tests. Colorectal Cancer  This type of cancer can be detected and often prevented.  Routine colorectal cancer screening usually begins at 61 years of age and continues through 61 years of age.  Your health care provider may recommend screening at an earlier age if you have risk factors for  colon cancer.  Your health care provider may also recommend using home test kits to check for hidden blood in the stool.  A small camera at the end of a tube can be used to examine your colon directly (sigmoidoscopy or colonoscopy). This is done to check for the earliest forms of colorectal cancer.  Routine screening usually begins at age 36.  Direct examination of the colon should be repeated every 5-10 years through 61 years of age. However, you may need to be screened more often if early forms of precancerous polyps or small growths are found. Skin Cancer  Check your skin from head to toe regularly.  Tell your health care provider about any new moles or changes in moles, especially if there is a change in a mole's shape or color.  Also tell your health care provider if you have a mole that is larger than the size of a pencil eraser.  Always use sunscreen. Apply sunscreen liberally and repeatedly throughout the day.  Protect  yourself by wearing long sleeves, pants, a wide-brimmed hat, and sunglasses whenever you are outside. HEART DISEASE, DIABETES, AND HIGH BLOOD PRESSURE   High blood pressure causes heart disease and increases the risk of stroke. High blood pressure is more likely to develop in:  People who have blood pressure in the high end of the normal range (130-139/85-89 mm Hg).  People who are overweight or obese.  People who are African American.  If you are 54-65 years of age, have your blood pressure checked every 3-5 years. If you are 81 years of age or older, have your blood pressure checked every year. You should have your blood pressure measured twice--once when you are at a hospital or clinic, and once when you are not at a hospital or clinic. Record the average of the two measurements. To check your blood pressure when you are not at a hospital or clinic, you can use:  An automated blood pressure machine at a pharmacy.  A home blood pressure monitor.  If you  are between 81 years and 18 years old, ask your health care provider if you should take aspirin to prevent strokes.  Have regular diabetes screenings. This involves taking a blood sample to check your fasting blood sugar level.  If you are at a normal weight and have a low risk for diabetes, have this test once every three years after 61 years of age.  If you are overweight and have a high risk for diabetes, consider being tested at a younger age or more often. PREVENTING INFECTION  Hepatitis B  If you have a higher risk for hepatitis B, you should be screened for this virus. You are considered at high risk for hepatitis B if:  You were born in a country where hepatitis B is common. Ask your health care provider which countries are considered high risk.  Your parents were born in a high-risk country, and you have not been immunized against hepatitis B (hepatitis B vaccine).  You have HIV or AIDS.  You use needles to inject street drugs.  You live with someone who has hepatitis B.  You have had sex with someone who has hepatitis B.  You get hemodialysis treatment.  You take certain medicines for conditions, including cancer, organ transplantation, and autoimmune conditions. Hepatitis C  Blood testing is recommended for:  Everyone born from [birth year] through 1966.  Anyone with known risk factors for hepatitis C. Sexually transmitted infections (STIs)  You should be screened for sexually transmitted infections (STIs) including gonorrhea and chlamydia if:  You are sexually active and are younger than 61 years of age.  You are older than 61 years of age and your health care provider tells you that you are at risk for this type of infection.  Your sexual activity has changed since you were last screened and you are at an increased risk for chlamydia or gonorrhea. Ask your health care provider if you are at risk.  If you do not have HIV, but are at risk, it may be recommended that you  take a prescription medicine daily to prevent HIV infection. This is called pre-exposure prophylaxis (PrEP). You are considered at risk if:  You are sexually active and do not regularly use condoms or know the HIV status of your partner(s).  You take drugs by injection.  You are sexually active with a partner who has HIV. Talk with your health care provider about whether you are at high risk of being infected with  HIV. If you choose to begin PrEP, you should first be tested for HIV. You should then be tested every 3 months for as long as you are taking PrEP.  PREGNANCY   If you are premenopausal and you may become pregnant, ask your health care provider about preconception counseling.  If you may become pregnant, take 400 to 800 micrograms (mcg) of folic acid every day.  If you want to prevent pregnancy, talk to your health care provider about birth control (contraception). OSTEOPOROSIS AND MENOPAUSE   Osteoporosis is a disease in which the bones lose minerals and strength with aging. This can result in serious bone fractures. Your risk for osteoporosis can be identified using a bone density scan.  If you are 53 years of age or older, or if you are at risk for osteoporosis and fractures, ask your health care provider if you should be screened.  Ask your health care provider whether you should take a calcium or vitamin D supplement to lower your risk for osteoporosis.  Menopause may have certain physical symptoms and risks.  Hormone replacement therapy may reduce some of these symptoms and risks. Talk to your health care provider about whether hormone replacement therapy is right for you.  HOME CARE INSTRUCTIONS   Schedule regular health, dental, and eye exams.  Stay current with your immunizations.   Do not use any tobacco products including cigarettes, chewing tobacco, or electronic cigarettes.  If you are pregnant, do not drink alcohol.  If you are breastfeeding, limit how  much and how often you drink alcohol.  Limit alcohol intake to no more than 1 drink per day for nonpregnant women. One drink equals 12 ounces of beer, 5 ounces of wine, or 1 ounces of hard liquor.  Do not use street drugs.  Do not share needles.  Ask your health care provider for help if you need support or information about quitting drugs.  Tell your health care provider if you often feel depressed.  Tell your health care provider if you have ever been abused or do not feel safe at home.   This information is not intended to replace advice given to you by your health care provider. Make sure you discuss any questions you have with your health care provider.   Document Released: 04/01/2011 Document Revised: 10/07/2014 Document Reviewed: 08/18/2013 Elsevier Interactive Patient Education [employer].

## 2015-12-20 LAB — URINALYSIS W MICROSCOPIC + REFLEX CULTURE
BILIRUBIN URINE: NEGATIVE
Bacteria, UA: NONE SEEN [HPF]
Casts: NONE SEEN [LPF]
Glucose, UA: NEGATIVE
Hgb urine dipstick: NEGATIVE
Ketones, ur: NEGATIVE
LEUKOCYTES UA: NEGATIVE
Nitrite: NEGATIVE
Protein, ur: NEGATIVE
RBC / HPF: NONE SEEN RBC/HPF (ref <=2)
Specific Gravity, Urine: 1.019 (ref 1.001–1.035)
Squamous Epithelial / LPF: NONE SEEN [HPF] (ref <=5)
WBC UA: NONE SEEN WBC/HPF (ref <=5)
Yeast: NONE SEEN [HPF]
pH: 5.5 (ref 5.0–8.0)

## 2016-01-02 DIAGNOSIS — F331 Major depressive disorder, recurrent, moderate: Secondary | ICD-10-CM | POA: Diagnosis not present

## 2016-01-18 DIAGNOSIS — H04123 Dry eye syndrome of bilateral lacrimal glands: Secondary | ICD-10-CM | POA: Diagnosis not present

## 2016-01-18 DIAGNOSIS — H40013 Open angle with borderline findings, low risk, bilateral: Secondary | ICD-10-CM | POA: Diagnosis not present

## 2016-01-18 DIAGNOSIS — H2513 Age-related nuclear cataract, bilateral: Secondary | ICD-10-CM | POA: Diagnosis not present

## 2016-01-19 ENCOUNTER — Other Ambulatory Visit: Payer: Self-pay | Admitting: Internal Medicine

## 2016-01-22 ENCOUNTER — Other Ambulatory Visit: Payer: Self-pay | Admitting: Internal Medicine

## 2016-01-22 NOTE — Telephone Encounter (Signed)
Sent to pharmacy 

## 2016-01-25 NOTE — Telephone Encounter (Signed)
Will refuse, this was already done. Call pharmacy to see if they got.

## 2016-02-29 ENCOUNTER — Encounter: Payer: Self-pay | Admitting: Internal Medicine

## 2016-03-06 ENCOUNTER — Other Ambulatory Visit: Payer: Self-pay | Admitting: Dermatology

## 2016-03-06 DIAGNOSIS — L570 Actinic keratosis: Secondary | ICD-10-CM | POA: Diagnosis not present

## 2016-03-06 DIAGNOSIS — D0472 Carcinoma in situ of skin of left lower limb, including hip: Secondary | ICD-10-CM | POA: Diagnosis not present

## 2016-03-06 DIAGNOSIS — D485 Neoplasm of uncertain behavior of skin: Secondary | ICD-10-CM | POA: Diagnosis not present

## 2016-03-27 ENCOUNTER — Telehealth: Payer: Self-pay | Admitting: Internal Medicine

## 2016-03-28 MED ORDER — TRAMADOL HCL 50 MG PO TABS: 50.0000 mg | ORAL_TABLET | Freq: Every day | ORAL | Status: DC | PRN

## 2016-03-28 NOTE — Telephone Encounter (Signed)
Faxed script back to [facility].../lmb 

## 2016-03-28 NOTE — Addendum Note (Signed)
Addended by: Pricilla Holm A on: 03/28/2016 08:11 AM   Modules accepted: Orders

## 2016-03-28 NOTE — Telephone Encounter (Signed)
Printed and signed please fax.

## 2016-04-11 DIAGNOSIS — D0472 Carcinoma in situ of skin of left lower limb, including hip: Secondary | ICD-10-CM | POA: Diagnosis not present

## 2016-07-02 DIAGNOSIS — F331 Major depressive disorder, recurrent, moderate: Secondary | ICD-10-CM | POA: Diagnosis not present

## 2016-07-05 ENCOUNTER — Telehealth: Payer: Self-pay | Admitting: Internal Medicine

## 2016-07-05 NOTE — Telephone Encounter (Signed)
Patient Name: Monique [name]  DOB: 1956/07/01    Initial Comment Caller states she has vomited 4 times today, having stomach pain.   Nurse Assessment  Nurse: Raphael Gibney, RN, Vera Date/Time (Eastern Time): 07/05/2016 1:02:22 PM  Confirm and document reason for call. If symptomatic, describe symptoms. You must click the next button to save text entered. ---Caller states she has vomited at least 4 times today. Has not had diarrhea. Vomiting started around 10 pm last night. She is having abd pain all over her abd. cramping is constant. having a lot of nausea. She urinated around 5 am.  Has the patient traveled out of the country within the last 30 days? ---No  Does the patient have any new or worsening symptoms? ---Yes  Will a triage be completed? ---Yes  Related visit to physician within the last 2 weeks? ---No  Does the PT have any chronic conditions? (i.e. diabetes, asthma, etc.) ---No  Is this a behavioral health or substance abuse call? ---No     Guidelines    Guideline Title Affirmed Question Affirmed Notes  Vomiting [1] MODERATE vomiting (e.g., 3 - 5 times/day) AND [2] age > 50    Final Disposition User   Go to ED Now (or PCP triage) Raphael Gibney, RN, Vera    Comments  Pt does not want to go to the ER or come to the office. She would like [facility] called in.  called back line and spoke to [facility] and gave report that pt has been vomiting frequently with triage outcome or to to ER (or PCP triage). She does not want to go to ER or come to the office and is requesting that [facility] be called in.   Referrals  GO TO FACILITY REFUSED   Disagree/Comply: Disagree  Disagree/Comply Reason: Disagree with instructions

## 2016-07-22 ENCOUNTER — Other Ambulatory Visit: Payer: Self-pay | Admitting: Internal Medicine

## 2016-07-22 NOTE — Telephone Encounter (Signed)
MD out of office until Wed. Pls advise on refill...Johny Chess

## 2016-07-22 NOTE — Telephone Encounter (Signed)
Ok to fill 

## 2016-07-23 MED ORDER — TRAMADOL HCL 50 MG PO TABS: 50.0000 mg | ORAL_TABLET | Freq: Every day | ORAL | 0 refills | Status: DC | PRN

## 2016-07-23 NOTE — Telephone Encounter (Signed)
Called refill into [facility] had to leave on pharmacy vm../lmb 

## 2016-07-31 ENCOUNTER — Encounter: Payer: Self-pay | Admitting: Geriatric Medicine

## 2016-08-01 ENCOUNTER — Other Ambulatory Visit: Payer: Self-pay | Admitting: Internal Medicine

## 2016-08-07 DIAGNOSIS — F331 Major depressive disorder, recurrent, moderate: Secondary | ICD-10-CM | POA: Diagnosis not present

## 2016-09-01 ENCOUNTER — Other Ambulatory Visit: Payer: Self-pay | Admitting: Internal Medicine

## 2016-09-10 DIAGNOSIS — F331 Major depressive disorder, recurrent, moderate: Secondary | ICD-10-CM | POA: Diagnosis not present

## 2016-09-25 ENCOUNTER — Ambulatory Visit (INDEPENDENT_AMBULATORY_CARE_PROVIDER_SITE_OTHER): Payer: Federal, State, Local not specified - PPO | Admitting: Family Medicine

## 2016-09-25 ENCOUNTER — Ambulatory Visit (INDEPENDENT_AMBULATORY_CARE_PROVIDER_SITE_OTHER): Payer: Federal, State, Local not specified - PPO

## 2016-09-25 VITALS — BP 116/74 | HR 63 | Temp 98.2°F | Resp 16 | Ht 66.5 in | Wt 133.0 lb

## 2016-09-25 DIAGNOSIS — R0789 Other chest pain: Secondary | ICD-10-CM

## 2016-09-25 DIAGNOSIS — S20212A Contusion of left front wall of thorax, initial encounter: Secondary | ICD-10-CM | POA: Diagnosis not present

## 2016-09-25 NOTE — Patient Instructions (Addendum)
There is no evidence of fracture on the x-ray.  His represents a chest wall contusion and will heal with time.  Tylenol or ibuprofen as needed for pain    IF you received an x-ray today, you will receive an invoice from [facility]. Please contact [facility] at [phone] with questions or concerns regarding your invoice.   IF you received labwork today, you will receive an invoice from [facility]. Please contact [facility] at [phone] with questions or concerns regarding your invoice.   Our billing staff will not be able to assist you with questions regarding bills from these companies.  You will be contacted with the lab results as soon as they are available. The fastest way to get your results is to activate your My Chart account. Instructions are located on the last page of this paperwork. If you have not heard from [place] regarding the results in 2 weeks, please contact this office.

## 2016-09-25 NOTE — Progress Notes (Signed)
Patient ID: Monique [name], female    DOB: 1956-02-22  Age: 60 y.o. MRN: [id number]  Chief Complaint  Patient presents with  . Rib Injury    left side, fall, x 5 days    Subjective:   Pleasant 60 year old lady who is here with left chest wall pain. A week ago she was doing some stuff try to reach down into the green garbage can to get something out. She was standing on a stump and fell forward, hitting her left chest wall below the left breast across the garbage can. She had pain, but the pain is continued to persist. As she read the Web MD she started getting concerned that maybe she should get checked, so she came on in. She did fracture and right rib many years ago.  She is not currently unemployed. 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 She still grieves his death but stays active.  Current allergies, medications, problem list, past/family and social histories reviewed.  Objective:  BP 116/74 (BP Location: Right Arm, Patient Position: Sitting, Cuff Size: Normal)   Pulse 63   Temp 98.2 F (36.8 C)   Resp 16   Ht 5' 6.5" (1.689 m)   Wt 133 lb (60.3 kg)   SpO2 99%   BMI 21.15 kg/m   Pleasant lady in no acute distress. Chest is clear to auscultation. Left chest is tender at about the eighth rib just under the angle of the left breast. The tenderness is about at the anterior axillary/mid breast line. No ecchymoses visible.  Assessment & Plan:   Assessment: 1. Contusion of left chest wall, initial encounter   2. Acute chest wall pain       Plan: X-ray her ribs and proceed from there.    Orders Placed This Encounter  Procedures  . DG Ribs Unilateral W/Chest Left    Standing Status:   Future    Number of Occurrences:   1    Standing Expiration Date:   09/25/2017    Order Specific Question:   Reason for Exam (SYMPTOM  OR DIAGNOSIS REQUIRED)    Answer:   left chest wall pain, contusion 1 week ago   Order Specific Question:   Preferred imaging location?    Answer:   External    No orders of the defined types were placed in this encounter.        Patient Instructions   There is no evidence of fracture on the x-ray.  His represents a chest wall contusion and will heal with time.  Tylenol or ibuprofen as needed for pain    IF you received an x-ray today, you will receive an invoice from [facility]. Please contact [facility]iology at [phone] with questions or concerns regarding your invoice.   IF you received labwork today, you will receive an invoice from [place]. Please contact [facility] at [phone] with questions or concerns regarding your invoice.   Our billing staff will not be able to assist you with questions regarding bills from these companies.  You will be contacted with the lab results as soon as they are available. The fastest way to get your results is to activate your My Chart account. Instructions are located on the last page of this paperwork. If you have not heard from [place] regarding the results in 2 weeks, please contact this office.         No Follow-up on  file.   Ruben Reason, MD 09/25/2016

## 2016-10-06 ENCOUNTER — Encounter: Payer: Self-pay | Admitting: Family Medicine

## 2016-10-08 ENCOUNTER — Other Ambulatory Visit: Payer: Self-pay | Admitting: Internal Medicine

## 2016-10-09 MED ORDER — TRAMADOL HCL 50 MG PO TABS: 50.0000 mg | ORAL_TABLET | Freq: Every day | ORAL | 0 refills | Status: DC | PRN

## 2016-10-09 NOTE — Telephone Encounter (Signed)
Faxed script to [facility]...Johny Chess

## 2016-10-29 ENCOUNTER — Ambulatory Visit (INDEPENDENT_AMBULATORY_CARE_PROVIDER_SITE_OTHER): Payer: Federal, State, Local not specified - PPO | Admitting: Internal Medicine

## 2016-10-29 ENCOUNTER — Encounter: Payer: Self-pay | Admitting: Internal Medicine

## 2016-10-29 DIAGNOSIS — M545 Low back pain, unspecified: Secondary | ICD-10-CM

## 2016-10-29 MED ORDER — PREDNISONE 20 MG PO TABS: 40.0000 mg | ORAL_TABLET | Freq: Every day | ORAL | 0 refills | Status: DC

## 2016-10-29 NOTE — Patient Instructions (Signed)
We have sent in the prednisone to help the back. Take 2 pills daily for the next 5 days.   It is okay to keep using advil and the tramadol as well.

## 2016-10-29 NOTE — Progress Notes (Signed)
   Subjective:    Patient ID: Monique [name], female    DOB: 1956/04/09, 61 y.o.   MRN: [id number]  HPI The patient is a 61 YO female coming in for back pain going on since Wednesday. She started yoga before this pain started. She may have pulled something. The pain worsened during the week last week. She then took some of her sister's pain medication (she is not sure what it was) and this constipated her which made her back feel worse. She is still having the pain in her back and the constipation is gradually resolving. No pain in her legs and no radiation anywhere of the pain. No numbness or weakness of her legs. Mostly in the low back on the sides.   Review of Systems  Constitutional: Positive for activity change. Negative for appetite change, chills, fatigue, fever and unexpected weight change.  Respiratory: Negative.   Cardiovascular: Negative.   Gastrointestinal: Positive for constipation. Negative for abdominal distention, abdominal pain, diarrhea and nausea.  Musculoskeletal: Positive for back pain and myalgias. Negative for gait problem and joint swelling.  Skin: Negative.   Neurological: Negative.       Objective:   Physical Exam  Constitutional: She is oriented to person, place, and time. She appears well-developed and well-nourished.  HENT:  Head: Normocephalic and atraumatic.  Eyes: EOM are normal.  Neck: Normal range of motion.  Cardiovascular: Normal rate and regular rhythm.   Pulmonary/Chest: Effort normal and breath sounds normal.  Abdominal: Soft.  Musculoskeletal: She exhibits tenderness.  Pain paraspinally right greater than left lumbar region to the SI joints  Neurological: She is alert and oriented to person, place, and time.   Vitals:   10/29/16 1528  BP: 110/72  Pulse: 74  Resp: 12  Temp: 97.8 F (36.6 C)  TempSrc: Oral  SpO2: 92%  Weight: 139 lb (63 kg)  Height: 5' 6.5" (1.689 m)      Assessment & Plan:

## 2016-10-29 NOTE — Progress Notes (Signed)
Pre visit review using our clinic review tool, if applicable. No additional management support is needed unless otherwise documented below in the visit note. 

## 2016-10-30 DIAGNOSIS — M545 Low back pain, unspecified: Secondary | ICD-10-CM | POA: Insufficient documentation

## 2016-10-30 NOTE — Assessment & Plan Note (Signed)
Rx for prednisone burst. She will continue using aleve and tramadol at home which we have prescribed already.

## 2016-11-02 ENCOUNTER — Other Ambulatory Visit: Payer: Self-pay | Admitting: Internal Medicine

## 2016-11-04 DIAGNOSIS — F331 Major depressive disorder, recurrent, moderate: Secondary | ICD-10-CM | POA: Diagnosis not present

## 2016-11-07 DIAGNOSIS — M545 Low back pain: Secondary | ICD-10-CM | POA: Diagnosis not present

## 2016-11-15 DIAGNOSIS — M545 Low back pain: Secondary | ICD-10-CM | POA: Diagnosis not present

## 2016-11-25 DIAGNOSIS — M545 Low back pain: Secondary | ICD-10-CM | POA: Diagnosis not present

## 2016-12-03 DIAGNOSIS — M545 Low back pain: Secondary | ICD-10-CM | POA: Diagnosis not present

## 2016-12-10 DIAGNOSIS — M545 Low back pain: Secondary | ICD-10-CM | POA: Diagnosis not present

## 2016-12-19 ENCOUNTER — Other Ambulatory Visit: Payer: Self-pay | Admitting: Internal Medicine

## 2016-12-24 ENCOUNTER — Encounter: Payer: Self-pay | Admitting: Internal Medicine

## 2016-12-24 ENCOUNTER — Other Ambulatory Visit (INDEPENDENT_AMBULATORY_CARE_PROVIDER_SITE_OTHER): Payer: Federal, State, Local not specified - PPO

## 2016-12-24 ENCOUNTER — Ambulatory Visit (INDEPENDENT_AMBULATORY_CARE_PROVIDER_SITE_OTHER): Payer: Federal, State, Local not specified - PPO | Admitting: Internal Medicine

## 2016-12-24 VITALS — BP 110/64 | HR 57 | Temp 97.5°F | Resp 12 | Ht 66.5 in | Wt 138.0 lb

## 2016-12-24 DIAGNOSIS — Z0000 Encounter for general adult medical examination without abnormal findings: Secondary | ICD-10-CM

## 2016-12-24 DIAGNOSIS — J301 Allergic rhinitis due to pollen: Secondary | ICD-10-CM | POA: Diagnosis not present

## 2016-12-24 LAB — CBC
HEMATOCRIT: 38.7 % (ref 36.0–46.0)
Hemoglobin: 12.7 g/dL (ref 12.0–15.0)
MCHC: 32.9 g/dL (ref 30.0–36.0)
MCV: 94 fl (ref 78.0–100.0)
PLATELETS: 193 10*3/uL (ref 150.0–400.0)
RBC: 4.12 Mil/uL (ref 3.87–5.11)
RDW: 14.5 % (ref 11.5–15.5)
WBC: 4.4 10*3/uL (ref 4.0–10.5)

## 2016-12-24 LAB — COMPREHENSIVE METABOLIC PANEL
ALBUMIN: 4.2 g/dL (ref 3.5–5.2)
ALT: 13 U/L (ref 0–35)
AST: 18 U/L (ref 0–37)
Alkaline Phosphatase: 68 U/L (ref 39–117)
BILIRUBIN TOTAL: 0.4 mg/dL (ref 0.2–1.2)
BUN: 12 mg/dL (ref 6–23)
CALCIUM: 9.2 mg/dL (ref 8.4–10.5)
CHLORIDE: 106 meq/L (ref 96–112)
CO2: 30 meq/L (ref 19–32)
CREATININE: 0.68 mg/dL (ref 0.40–1.20)
GFR: 93.42 mL/min (ref >60.00)
Glucose, Bld: 92 mg/dL (ref 70–99)
Potassium: 3.6 mEq/L (ref 3.5–5.1)
Sodium: 141 mEq/L (ref 135–145)
Total Protein: 7.1 g/dL (ref 6.0–8.3)

## 2016-12-24 LAB — LIPID PANEL
CHOL/HDL RATIO: 2
Cholesterol: 177 mg/dL (ref 0–200)
HDL: 73 mg/dL (ref >39.00)
LDL CALC: 92 mg/dL (ref 0–99)
NonHDL: 103.75
TRIGLYCERIDES: 61 mg/dL (ref 0.0–149.0)
VLDL: 12.2 mg/dL (ref 0.0–40.0)

## 2016-12-24 LAB — TSH: TSH: 3.67 u[IU]/mL (ref 0.35–4.50)

## 2016-12-24 NOTE — Progress Notes (Signed)
Pre visit review using our clinic review tool, if applicable. No additional management support is needed unless otherwise documented below in the visit note. 

## 2016-12-24 NOTE — Assessment & Plan Note (Signed)
Reminded about need for pap smear, flu shot up to date. Tetanus up to date. Mammogram up to date. Counseled about dangers of distracted driving and sun safety as well as mole surveillance. Given screening recommendations.

## 2016-12-24 NOTE — Progress Notes (Signed)
   Subjective:    Patient ID: Monique [name], female    DOB: 04-16-1956, 61 y.o.   MRN: [id number]  HPI The patient is a 61 YO female coming in for wellness. No new concerns.  PMH, [facility], social history reviewed and updated.   Review of Systems  Constitutional: Negative.   HENT: Positive for congestion, postnasal drip and rhinorrhea.   Eyes: Negative.   Respiratory: Positive for cough. Negative for chest tightness and shortness of breath.   Cardiovascular: Negative for chest pain, palpitations and leg swelling.  Gastrointestinal: Negative for abdominal distention, abdominal pain, constipation, diarrhea, nausea and vomiting.  Musculoskeletal: Negative.   Skin: Negative.   Neurological: Negative.   Psychiatric/Behavioral: Negative.       Objective:   Physical Exam  Constitutional: She is oriented to person, place, and time. She appears well-developed and well-nourished.  HENT:  Head: Normocephalic and atraumatic.  Eyes: EOM are normal.  Neck: Normal range of motion.  Cardiovascular: Normal rate and regular rhythm.   Pulmonary/Chest: Effort normal and breath sounds normal. No respiratory distress. She has no wheezes. She has no rales.  Abdominal: Soft. Bowel sounds are normal. She exhibits no distension. There is no tenderness. There is no rebound.  Musculoskeletal: She exhibits no edema.  Neurological: She is alert and oriented to person, place, and time. Coordination normal.  Skin: Skin is warm and dry.  Psychiatric: She has a normal mood and affect.   Vitals:   12/24/16 1103  BP: 110/64  Pulse: (!) 57  Resp: 12  Temp: 97.5 F (36.4 C)  TempSrc: Oral  SpO2: 100%  Weight: 138 lb (62.6 kg)  Height: 5' 6.5" (1.689 m)      Assessment & Plan:

## 2016-12-24 NOTE — Patient Instructions (Signed)
We are checking the labs today and will let you know about the results.   Health Maintenance, Female Adopting a healthy lifestyle and getting preventive care can go a long way to promote health and wellness. Talk with your health care provider about what schedule of regular examinations is right for you. This is a good chance for you to check in with your provider about disease prevention and staying healthy. In between checkups, there are plenty of things you can do on your own. Experts have done a lot of research about which lifestyle changes and preventive measures are most likely to keep you healthy. Ask your health care provider for more information. Weight and diet Eat a healthy diet  Be sure to include plenty of vegetables, fruits, low-fat dairy products, and lean protein.  Do not eat a lot of foods high in solid fats, added sugars, or salt.  Get regular exercise. This is one of the most important things you can do for your health.  Most adults should exercise for at least 150 minutes each week. The exercise should increase your heart rate and make you sweat (moderate-intensity exercise).  Most adults should also do strengthening exercises at least twice a week. This is in addition to the moderate-intensity exercise. Maintain a healthy weight  Body mass index (BMI) is a measurement that can be used to identify possible weight problems. It estimates body fat based on height and weight. Your health care provider can help determine your BMI and help you achieve or maintain a healthy weight.  For females 25 years of age and older:  A BMI below 18.5 is considered underweight.  A BMI of 18.5 to 24.9 is normal.  A BMI of 25 to 29.9 is considered overweight.  A BMI of 30 and above is considered obese. Watch levels of cholesterol and blood lipids  You should start having your blood tested for lipids and cholesterol at 62 years of age, then have this test every 5 years.  You may need  to have your cholesterol levels checked more often if:  Your lipid or cholesterol levels are high.  You are older than 62 years of age.  You are at high risk for heart disease. Cancer screening Lung Cancer  Lung cancer screening is recommended for adults 12-37 years old who are at high risk for lung cancer because of a history of smoking.  A yearly low-dose CT scan of the lungs is recommended for people who:  Currently smoke.  Have quit within the past 15 years.  Have at least a 30-pack-year history of smoking. A pack year is smoking an average of one pack of cigarettes a day for 1 year.  Yearly screening should continue until it has been 15 years since you quit.  Yearly screening should stop if you develop a health problem that would prevent you from having lung cancer treatment. Breast Cancer  Practice breast self-awareness. This means understanding how your breasts normally appear and feel.  It also means doing regular breast self-exams. Let your health care provider know about any changes, no matter how small.  If you are in your 20s or 30s, you should have a clinical breast exam (CBE) by a health care provider every 1-3 years as part of a regular health exam.  If you are 52 or older, have a CBE every year. Also consider having a breast X-ray (mammogram) every year.  If you have a family history of breast cancer, talk to your health  care provider about genetic screening.  If you are at high risk for breast cancer, talk to your health care provider about having an MRI and a mammogram every year.  Breast cancer gene (BRCA) assessment is recommended for women who have family members with BRCA-related cancers. BRCA-related cancers include:  Breast.  Ovarian.  Tubal.  Peritoneal cancers.  Results of the assessment will determine the need for genetic counseling and BRCA1 and BRCA2 testing. Cervical Cancer  Your health care provider may recommend that you be screened  regularly for cancer of the pelvic organs (ovaries, uterus, and vagina). This screening involves a pelvic examination, including checking for microscopic changes to the surface of your cervix (Pap test). You may be encouraged to have this screening done every 3 years, beginning at age 34.  For women ages 88-65, health care providers may recommend pelvic exams and Pap testing every 3 years, or they may recommend the Pap and pelvic exam, combined with testing for human papilloma virus (HPV), every 5 years. Some types of HPV increase your risk of cervical cancer. Testing for HPV may also be done on women of any age with unclear Pap test results.  Other health care providers may not recommend any screening for nonpregnant women who are considered low risk for pelvic cancer and who do not have symptoms. Ask your health care provider if a screening pelvic exam is right for you.  If you have had past treatment for cervical cancer or a condition that could lead to cancer, you need Pap tests and screening for cancer for at least 20 years after your treatment. If Pap tests have been discontinued, your risk factors (such as having a new sexual partner) need to be reassessed to determine if screening should resume. Some women have medical problems that increase the chance of getting cervical cancer. In these cases, your health care provider may recommend more frequent screening and Pap tests. Colorectal Cancer  This type of cancer can be detected and often prevented.  Routine colorectal cancer screening usually begins at 62 years of age and continues through 62 years of age.  Your health care provider may recommend screening at an earlier age if you have risk factors for colon cancer.  Your health care provider may also recommend using home test kits to check for hidden blood in the stool.  A small camera at the end of a tube can be used to examine your colon directly (sigmoidoscopy or colonoscopy). This is  done to check for the earliest forms of colorectal cancer.  Routine screening usually begins at age 14.  Direct examination of the colon should be repeated every 5-10 years through 62 years of age. However, you may need to be screened more often if early forms of precancerous polyps or small growths are found. Skin Cancer  Check your skin from head to toe regularly.  Tell your health care provider about any new moles or changes in moles, especially if there is a change in a mole's shape or color.  Also tell your health care provider if you have a mole that is larger than the size of a pencil eraser.  Always use sunscreen. Apply sunscreen liberally and repeatedly throughout the day.  Protect yourself by wearing long sleeves, pants, a wide-brimmed hat, and sunglasses whenever you are outside. Heart disease, diabetes, and high blood pressure  High blood pressure causes heart disease and increases the risk of stroke. High blood pressure is more likely to develop in:  People  who have blood pressure in the high end of the normal range (130-139/85-89 mm Hg).  People who are overweight or obese.  People who are African American.  If you are 18-39 years of age, have your blood pressure checked every 3-5 years. If you are 40 years of age or older, have your blood pressure checked every year. You should have your blood pressure measured twice-once when you are at a hospital or clinic, and once when you are not at a hospital or clinic. Record the average of the two measurements. To check your blood pressure when you are not at a hospital or clinic, you can use:  An automated blood pressure machine at a pharmacy.  A home blood pressure monitor.  If you are between 55 years and 79 years old, ask your health care provider if you should take aspirin to prevent strokes.  Have regular diabetes screenings. This involves taking a blood sample to check your fasting blood sugar level.  If you are at a  normal weight and have a low risk for diabetes, have this test once every three years after 62 years of age.  If you are overweight and have a high risk for diabetes, consider being tested at a younger age or more often. Preventing infection Hepatitis B  If you have a higher risk for hepatitis B, you should be screened for this virus. You are considered at high risk for hepatitis B if:  You were born in a country where hepatitis B is common. Ask your health care provider which countries are considered high risk.  Your parents were born in a high-risk country, and you have not been immunized against hepatitis B (hepatitis B vaccine).  You have HIV or AIDS.  You use needles to inject street drugs.  You live with someone who has hepatitis B.  You have had sex with someone who has hepatitis B.  You get hemodialysis treatment.  You take certain medicines for conditions, including cancer, organ transplantation, and autoimmune conditions. Hepatitis C  Blood testing is recommended for:  Everyone born from 1945 through 1965.  Anyone with known risk factors for hepatitis C. Sexually transmitted infections (STIs)  You should be screened for sexually transmitted infections (STIs) including gonorrhea and chlamydia if:  You are sexually active and are younger than 62 years of age.  You are older than 62 years of age and your health care provider tells you that you are at risk for this type of infection.  Your sexual activity has changed since you were last screened and you are at an increased risk for chlamydia or gonorrhea. Ask your health care provider if you are at risk.  If you do not have HIV, but are at risk, it may be recommended that you take a prescription medicine daily to prevent HIV infection. This is called pre-exposure prophylaxis (PrEP). You are considered at risk if:  You are sexually active and do not regularly use condoms or know the HIV status of your partner(s).  You  take drugs by injection.  You are sexually active with a partner who has HIV. Talk with your health care provider about whether you are at high risk of being infected with HIV. If you choose to begin PrEP, you should first be tested for HIV. You should then be tested every 3 months for as long as you are taking PrEP. Pregnancy  If you are premenopausal and you may become pregnant, ask your health care provider about preconception   counseling.  If you may become pregnant, take 400 to 800 micrograms (mcg) of folic acid every day.  If you want to prevent pregnancy, talk to your health care provider about birth control (contraception). Osteoporosis and menopause  Osteoporosis is a disease in which the bones lose minerals and strength with aging. This can result in serious bone fractures. Your risk for osteoporosis can be identified using a bone density scan.  If you are 58 years of age or older, or if you are at risk for osteoporosis and fractures, ask your health care provider if you should be screened.  Ask your health care provider whether you should take a calcium or vitamin D supplement to lower your risk for osteoporosis.  Menopause may have certain physical symptoms and risks.  Hormone replacement therapy may reduce some of these symptoms and risks. Talk to your health care provider about whether hormone replacement therapy is right for you. Follow these instructions at home:  Schedule regular health, dental, and eye exams.  Stay current with your immunizations.  Do not use any tobacco products including cigarettes, chewing tobacco, or electronic cigarettes.  If you are pregnant, do not drink alcohol.  If you are breastfeeding, limit how much and how often you drink alcohol.  Limit alcohol intake to no more than 1 drink per day for nonpregnant women. One drink equals 12 ounces of beer, 5 ounces of wine, or 1 ounces of hard liquor.  Do not use street drugs.  Do not share  needles.  Ask your health care provider for help if you need support or information about quitting drugs.  Tell your health care provider if you often feel depressed.  Tell your health care provider if you have ever been abused or do not feel safe at home. This information is not intended to replace advice given to you by your health care provider. Make sure you discuss any questions you have with your health care provider. Document Released: 04/01/2011 Document Revised: 02/22/2016 Document Reviewed: 06/20/2015 Elsevier Interactive Patient Education  2017 [employer].

## 2016-12-24 NOTE — Assessment & Plan Note (Signed)
Likely cause of her cough. Not being diligent about singulair and flonase and reminded her that this is allergy season and she should start using regularly. Lungs clear on exam.

## 2016-12-25 DIAGNOSIS — F331 Major depressive disorder, recurrent, moderate: Secondary | ICD-10-CM | POA: Diagnosis not present

## 2016-12-26 DIAGNOSIS — R338 Other retention of urine: Secondary | ICD-10-CM | POA: Diagnosis not present

## 2016-12-26 DIAGNOSIS — Z6821 Body mass index (BMI) 21.0-21.9, adult: Secondary | ICD-10-CM | POA: Diagnosis not present

## 2016-12-26 DIAGNOSIS — Z01419 Encounter for gynecological examination (general) (routine) without abnormal findings: Secondary | ICD-10-CM | POA: Diagnosis not present

## 2016-12-26 DIAGNOSIS — Z1151 Encounter for screening for human papillomavirus (HPV): Secondary | ICD-10-CM | POA: Diagnosis not present

## 2016-12-26 DIAGNOSIS — Z1231 Encounter for screening mammogram for malignant neoplasm of breast: Secondary | ICD-10-CM | POA: Diagnosis not present

## 2016-12-31 ENCOUNTER — Other Ambulatory Visit: Payer: Self-pay | Admitting: Internal Medicine

## 2017-01-06 ENCOUNTER — Other Ambulatory Visit: Payer: Self-pay | Admitting: Dermatology

## 2017-01-06 DIAGNOSIS — D044 Carcinoma in situ of skin of scalp and neck: Secondary | ICD-10-CM | POA: Diagnosis not present

## 2017-01-06 DIAGNOSIS — C44319 Basal cell carcinoma of skin of other parts of face: Secondary | ICD-10-CM | POA: Diagnosis not present

## 2017-01-06 DIAGNOSIS — D229 Melanocytic nevi, unspecified: Secondary | ICD-10-CM | POA: Diagnosis not present

## 2017-01-06 DIAGNOSIS — L111 Transient acantholytic dermatosis [Grover]: Secondary | ICD-10-CM | POA: Diagnosis not present

## 2017-01-23 DIAGNOSIS — H40013 Open angle with borderline findings, low risk, bilateral: Secondary | ICD-10-CM | POA: Diagnosis not present

## 2017-01-23 DIAGNOSIS — H53431 Sector or arcuate defects, right eye: Secondary | ICD-10-CM | POA: Diagnosis not present

## 2017-01-23 DIAGNOSIS — H2513 Age-related nuclear cataract, bilateral: Secondary | ICD-10-CM | POA: Diagnosis not present

## 2017-02-06 DIAGNOSIS — H40013 Open angle with borderline findings, low risk, bilateral: Secondary | ICD-10-CM | POA: Diagnosis not present

## 2017-02-06 DIAGNOSIS — H2513 Age-related nuclear cataract, bilateral: Secondary | ICD-10-CM | POA: Diagnosis not present

## 2017-02-06 DIAGNOSIS — H04123 Dry eye syndrome of bilateral lacrimal glands: Secondary | ICD-10-CM | POA: Diagnosis not present

## 2017-02-16 ENCOUNTER — Other Ambulatory Visit: Payer: Self-pay | Admitting: Internal Medicine

## 2017-03-05 DIAGNOSIS — F331 Major depressive disorder, recurrent, moderate: Secondary | ICD-10-CM | POA: Diagnosis not present

## 2017-03-05 DIAGNOSIS — F411 Generalized anxiety disorder: Secondary | ICD-10-CM | POA: Diagnosis not present

## 2017-03-06 ENCOUNTER — Other Ambulatory Visit: Payer: Self-pay | Admitting: Dermatology

## 2017-03-06 DIAGNOSIS — C44319 Basal cell carcinoma of skin of other parts of face: Secondary | ICD-10-CM | POA: Diagnosis not present

## 2017-03-28 ENCOUNTER — Telehealth: Payer: Self-pay | Admitting: *Deleted

## 2017-03-28 MED ORDER — ACYCLOVIR 5 % EX OINT: 1.0000 "application " | TOPICAL_OINTMENT | CUTANEOUS | 0 refills | Status: DC

## 2017-03-28 NOTE — Telephone Encounter (Signed)
Pt called requesting refill acyclovir 5% ointment, states she takes both valtrex and acyclovir ointment as needed. Patient said she had annual in March 2018, I see annual in paper chart for Jan 2017. Please advise

## 2017-03-28 NOTE — Telephone Encounter (Signed)
Rx has been sent. Pt aware 

## 2017-03-28 NOTE — Telephone Encounter (Signed)
Agree with Acyclovir 5% Ointment represcription.  Could you check with Lynnell Chad when due for Annual/Gyn exam?  Thanks.

## 2017-04-03 DIAGNOSIS — D044 Carcinoma in situ of skin of scalp and neck: Secondary | ICD-10-CM | POA: Diagnosis not present

## 2017-04-06 ENCOUNTER — Other Ambulatory Visit: Payer: Self-pay | Admitting: Internal Medicine

## 2017-04-07 DIAGNOSIS — F331 Major depressive disorder, recurrent, moderate: Secondary | ICD-10-CM | POA: Diagnosis not present

## 2017-04-18 ENCOUNTER — Other Ambulatory Visit: Payer: Self-pay | Admitting: Internal Medicine

## 2017-04-21 MED ORDER — TRAMADOL HCL 50 MG PO TABS: 50.0000 mg | ORAL_TABLET | Freq: Every day | ORAL | 0 refills | Status: DC | PRN

## 2017-04-28 ENCOUNTER — Encounter: Payer: Self-pay | Admitting: Family Medicine

## 2017-04-28 ENCOUNTER — Ambulatory Visit (INDEPENDENT_AMBULATORY_CARE_PROVIDER_SITE_OTHER): Payer: Federal, State, Local not specified - PPO | Admitting: Family Medicine

## 2017-04-28 VITALS — BP 108/70 | HR 62 | Temp 97.5°F | Ht 66.5 in | Wt 137.0 lb

## 2017-04-28 DIAGNOSIS — J069 Acute upper respiratory infection, unspecified: Secondary | ICD-10-CM | POA: Insufficient documentation

## 2017-04-28 DIAGNOSIS — B9789 Other viral agents as the cause of diseases classified elsewhere: Secondary | ICD-10-CM

## 2017-04-28 DIAGNOSIS — J029 Acute pharyngitis, unspecified: Secondary | ICD-10-CM | POA: Diagnosis not present

## 2017-04-28 NOTE — Assessment & Plan Note (Signed)
Symptoms seem to be viral in nature.  -Advised conservative care - Given indications for follow-up

## 2017-04-28 NOTE — Patient Instructions (Signed)
Thank you for coming in,   You can try things such as zyrtec-D or allegra-D which is an antihistamine and decongestant.   You can try afrin which will help with nasal congestion but use for only three days.   You can also try using a netti pot on a regular occasion.    Please feel free to call with any questions or concerns at any time, at [phone]. --Dr. Raeford Razor

## 2017-04-28 NOTE — Progress Notes (Signed)
Monique [name] - 61 y.o. female MRN [id number]  Date of birth: [birth year]  SUBJECTIVE:  Including CC & ROS.  Chief Complaint  Patient presents with  . Sore Throat    X2 days Patient states throat hurts so bad that it hurts to swallow, taking acetaminophen that helps, talking and eating makes it hurts worse.   Monique [name] a 61 year old female is presenting with sore throat and cough. She reports her symptoms are been present for 2 days. She is having some pain with swallowing. She denies any fevers. She does have a sick contact with her sister and her niece. She denies any rashes. She denies any ear pain. She has tried some over-the-counter medications.     Review of Systems  Constitutional: Negative for fever.  HENT: Positive for sore throat. Negative for ear pain.   Respiratory: Positive for cough.   Gastrointestinal: Negative for vomiting.   otherwise negative  HISTORY: Past Medical, Surgical, Social, and Family History Reviewed & Updated per EMR.   Pertinent Historical Findings include:  Past Medical History:  Diagnosis Date  . Abnormal electrocardiogram   . Allergic rhinitis   . Anxiety   . Bipolar disorder ([place])   . Depression    Some  . History of hepatitis C    Hep B also  . History of skin cancer   . HSV infection   . Memory loss   . MVP (mitral valve prolapse)   . Paresthesia   . Unspecified chronic bronchitis ([place])     Past Surgical History:  Procedure Laterality Date  . APPENDECTOMY  11/08  . MOHS SURGERY    . RHINOPLASTY    . TUBAL LIGATION     bilateral    No Known Allergies  Family History  Problem Relation Age of Onset  . COPD Mother   . Allergies Mother   . Heart disease Mother   . Atrial fibrillation Mother   . Heart failure Mother   . Colon cancer Mother 88  . COPD Paternal Uncle   . Allergies Father   . Rheum arthritis Father   . Bladder Cancer Father   . Asthma Unknown      Social History   Social History  . Marital status:  Married    Spouse name: N/A  . Number of children: N/A  . Years of education: N/A   Occupational History  . 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 Smoking status: Former Smoker    Packs/day: 1.50    Years: 25.00    Types: Cigarettes    Quit date: 09/30/1996  . Smokeless tobacco: Never Used  . Alcohol use Yes     Comment: rare  . Drug use: No  . Sexual activity: Not on file   Other Topics Concern  . Not on file   Social History Narrative   1 cup caffeine daily   Married   No children   Daughter of marjorie herndon     PHYSICAL EXAM:  VS: BP 108/70 (BP Location: Left Arm, Patient Position: Sitting, Cuff Size: Normal)   Pulse 62   Temp (!) 97.5 F (36.4 C) (Oral)   Ht 5' 6.5" (1.689 m)   Wt 137 lb (62.1 kg)   SpO2 99%   BMI 21.78 kg/m  Physical Exam  Constitutional: She is oriented to person, place, and time. She appears well-developed and well-nourished.  HENT:  Head: Normocephalic and atraumatic.  Nose: Nose normal.  Mouth/Throat: Oropharynx is clear and moist.  Tympanic membranes are clear and intact bilaterally No tonsillar exudates or hypertrophy  Eyes: Conjunctivae and EOM are normal.  Neck: Normal range of motion. Neck supple.  Cardiovascular: Normal rate, regular rhythm and normal heart sounds.   No murmur heard. Pulmonary/Chest: Effort normal and breath sounds normal. She has no rales.  Abdominal: Soft. Bowel sounds are normal.  Musculoskeletal: Normal range of motion. She exhibits no edema.  Lymphadenopathy:    She has no cervical adenopathy.  Neurological: She is alert and oriented to person, place, and time.  Skin: Skin is warm. No rash noted.  Psychiatric: She has a normal mood and affect. Her behavior is normal.       ASSESSMENT & PLAN:   Sore throat Symptoms seem to be viral in nature.  -Advised conservative care - Given indications for follow-up

## 2017-05-26 ENCOUNTER — Other Ambulatory Visit: Payer: Self-pay | Admitting: Family

## 2017-05-28 ENCOUNTER — Other Ambulatory Visit: Payer: Self-pay | Admitting: Family

## 2017-05-29 MED ORDER — FLUTICASONE PROPIONATE 50 MCG/ACT NA SUSP: 1.0000 | Freq: Every day | NASAL | 3 refills | Status: DC

## 2017-07-02 ENCOUNTER — Other Ambulatory Visit: Payer: Self-pay | Admitting: Family

## 2017-07-03 MED ORDER — FLUTICASONE PROPIONATE 50 MCG/ACT NA SUSP: 2.0000 | Freq: Every day | NASAL | 11 refills | Status: DC

## 2017-07-03 MED ORDER — TRAMADOL HCL 50 MG PO TABS: 50.0000 mg | ORAL_TABLET | Freq: Every day | ORAL | 0 refills | Status: DC | PRN

## 2017-07-03 NOTE — Telephone Encounter (Signed)
Faxed to [facility] 

## 2017-07-07 DIAGNOSIS — F331 Major depressive disorder, recurrent, moderate: Secondary | ICD-10-CM | POA: Diagnosis not present

## 2017-08-10 ENCOUNTER — Other Ambulatory Visit: Payer: Self-pay | Admitting: Internal Medicine

## 2017-08-12 MED ORDER — TRAMADOL HCL 50 MG PO TABS: 50.0000 mg | ORAL_TABLET | Freq: Every day | ORAL | 0 refills | Status: DC | PRN

## 2017-09-02 DIAGNOSIS — F331 Major depressive disorder, recurrent, moderate: Secondary | ICD-10-CM | POA: Diagnosis not present

## 2017-10-09 ENCOUNTER — Ambulatory Visit: Payer: Federal, State, Local not specified - PPO | Admitting: Internal Medicine

## 2017-10-09 ENCOUNTER — Encounter: Payer: Self-pay | Admitting: Internal Medicine

## 2017-10-09 DIAGNOSIS — J069 Acute upper respiratory infection, unspecified: Secondary | ICD-10-CM

## 2017-10-09 DIAGNOSIS — B9789 Other viral agents as the cause of diseases classified elsewhere: Secondary | ICD-10-CM | POA: Diagnosis not present

## 2017-10-09 MED ORDER — HYDROCODONE-HOMATROPINE 5-1.5 MG/5ML PO SYRP: 5.0000 mL | ORAL_SOLUTION | Freq: Three times a day (TID) | ORAL | 0 refills | Status: DC | PRN

## 2017-10-09 NOTE — Patient Instructions (Addendum)
We have sent in the hycodan cough medicine to use for the cough.   Keep using the mucinex as needed and the flonase and zyrtec and singulair.    Upper Respiratory Infection, Adult Most upper respiratory infections (URIs) are caused by a virus. A URI affects the nose, throat, and upper air passages. The most common type of URI is often called "the common cold." Follow these instructions at home:  Take medicines only as told by your doctor.  Gargle warm saltwater or take cough drops to comfort your throat as told by your doctor.  Use a warm mist humidifier or inhale steam from a shower to increase air moisture. This may make it easier to breathe.  Drink enough fluid to keep your pee (urine) clear or pale yellow.  Eat soups and other clear broths.  Have a healthy diet.  Rest as needed.  Go back to work when your fever is gone or your doctor says it is okay. ? You may need to stay home longer to avoid giving your URI to others. ? You can also wear a face mask and wash your hands often to prevent spread of the virus.  Use your inhaler more if you have asthma.  Do not use any tobacco products, including cigarettes, chewing tobacco, or electronic cigarettes. If you need help quitting, ask your doctor. Contact a doctor if:  You are getting worse, not better.  Your symptoms are not helped by medicine.  You have chills.  You are getting more short of breath.  You have brown or red mucus.  You have yellow or brown discharge from your nose.  You have pain in your face, especially when you bend forward.  You have a fever.  You have puffy (swollen) neck glands.  You have pain while swallowing.  You have white areas in the back of your throat. Get help right away if:  You have very bad or constant: ? Headache. ? Ear pain. ? Pain in your forehead, behind your eyes, and over your cheekbones (sinus pain). ? Chest pain.  You have long-lasting (chronic) lung disease and any  of the following: ? Wheezing. ? Long-lasting cough. ? Coughing up blood. ? A change in your usual mucus.  You have a stiff neck.  You have changes in your: ? Vision. ? Hearing. ? Thinking. ? Mood. This information is not intended to replace advice given to you by your health care provider. Make sure you discuss any questions you have with your health care provider. Document Released: 03/04/2008 Document Revised: 05/19/2016 Document Reviewed: 12/22/2013 Elsevier Interactive Patient Education  2018 [employer].

## 2017-10-09 NOTE — Progress Notes (Signed)
   Subjective:    Patient ID: Monique [name], female    DOB: 1956-09-21, 62 y.o.   MRN: [id number]  HPI The patient is a 62 YO female coming in for congestion going on for about 2-3 days at this time. She does have cough without production. She is having some drainage. No ear pain or hearing changes. Denies headaches. Denies fevers but some chills. She denies SOB with activity. Some muscle aches. No known sick contacts. Overall worsening.   [place] narcotic database reviewed without any inappropriate substances  Review of Systems  Constitutional: Positive for activity change, appetite change and chills. Negative for fatigue, fever and unexpected weight change.  HENT: Positive for congestion, postnasal drip, rhinorrhea and sinus pressure. Negative for ear discharge, ear pain, sinus pain, sneezing, sore throat, tinnitus, trouble swallowing and voice change.   Eyes: Negative.   Respiratory: Positive for cough. Negative for chest tightness, shortness of breath and wheezing.   Cardiovascular: Negative.   Gastrointestinal: Negative.   Musculoskeletal: Positive for myalgias.  Neurological: Negative.       Objective:   Physical Exam  Constitutional: She is oriented to person, place, and time. She appears well-developed and well-nourished.  HENT:  Head: Normocephalic and atraumatic.  Oropharynx with redness and clear drainage, nose with swollen turbinates, TMs normal bilaterally  Eyes: EOM are normal.  Neck: Normal range of motion. No thyromegaly present.  Cardiovascular: Normal rate and regular rhythm.  Pulmonary/Chest: Effort normal and breath sounds normal. No respiratory distress. She has no wheezes. She has no rales.  Abdominal: Soft.  Musculoskeletal: She exhibits tenderness.  Lymphadenopathy:    She has no cervical adenopathy.  Neurological: She is alert and oriented to person, place, and time.  Skin: Skin is warm and dry.    Vitals:   10/09/17 1355  BP: 110/74  Pulse: 65  Temp:  97.9 F (36.6 C)  TempSrc: Oral  SpO2: 99%  Weight: 129 lb (58.5 kg)  Height: 5' 6.5" (1.689 m)      Assessment & Plan:

## 2017-10-10 NOTE — Assessment & Plan Note (Addendum)
Rx for hycodan ([place] narcotic database reviewed) and advised to continue flonase, singulair and zyrtec. No indication for antibiotics or steroids.

## 2017-10-15 ENCOUNTER — Telehealth: Payer: Self-pay | Admitting: Internal Medicine

## 2017-10-15 MED ORDER — PREDNISONE 20 MG PO TABS: 40.0000 mg | ORAL_TABLET | Freq: Every day | ORAL | 0 refills | Status: DC

## 2017-10-15 MED ORDER — HYDROCODONE-HOMATROPINE 5-1.5 MG/5ML PO SYRP: 5.0000 mL | ORAL_SOLUTION | Freq: Three times a day (TID) | ORAL | 0 refills | Status: DC | PRN

## 2017-10-15 NOTE — Telephone Encounter (Signed)
Copied from [place] [id number]. Topic: Inquiry >> Oct 15, 2017 10:12 AM Conception Chancy, NT wrote: Reason for CRM: patient was seen on 10/09/17 for respiratory infection and was told if she did not feel better for her to call the office and let Dr. Sharlet Salina know. She states she feels worse. She would like something called in for her. She said Dr. Sharlet Salina mention maybe coming in and getting a depo shot? Please advise.   [employer].

## 2017-10-15 NOTE — Telephone Encounter (Signed)
Patient informed. 

## 2017-10-15 NOTE — Addendum Note (Signed)
Addended by: Pricilla Holm A on: 10/15/2017 12:13 PM   Modules accepted: Orders

## 2017-10-15 NOTE — Telephone Encounter (Signed)
Called and informed patient and she wants to know if there is any way that she can get a refill on her cough syrup. She has a horrible cough still "almost asthmatic" and it keeps her up at night and she has very little left and it has been helping, just afraid to run out.

## 2017-10-15 NOTE — Telephone Encounter (Signed)
Have sent in prednisone which is similar to the shot. Take 2 pills daily for 5 days.

## 2017-10-15 NOTE — Telephone Encounter (Signed)
Refilled cough medicine

## 2017-10-27 ENCOUNTER — Other Ambulatory Visit: Payer: Self-pay | Admitting: Internal Medicine

## 2017-10-27 MED ORDER — TRAMADOL HCL 50 MG PO TABS: 50.0000 mg | ORAL_TABLET | Freq: Every day | ORAL | 0 refills | Status: DC | PRN

## 2017-10-27 NOTE — Telephone Encounter (Signed)
Control database checked last refill: 08/12/2017 LOV: 10/09/2017 acute, routine office visit 12/24/2016

## 2017-11-03 DIAGNOSIS — F3181 Bipolar II disorder: Secondary | ICD-10-CM | POA: Diagnosis not present

## 2017-12-22 ENCOUNTER — Other Ambulatory Visit: Payer: Self-pay | Admitting: Internal Medicine

## 2017-12-22 MED ORDER — TRAMADOL HCL 50 MG PO TABS: 50.0000 mg | ORAL_TABLET | Freq: Every day | ORAL | 0 refills | Status: DC | PRN

## 2017-12-22 NOTE — Telephone Encounter (Signed)
Control database checked last refill:10/27/2017  LOV: acute 10/09/17 CPE 12/24/2016

## 2018-01-08 ENCOUNTER — Encounter: Payer: Self-pay | Admitting: Internal Medicine

## 2018-01-08 ENCOUNTER — Ambulatory Visit (INDEPENDENT_AMBULATORY_CARE_PROVIDER_SITE_OTHER): Payer: Federal, State, Local not specified - PPO | Admitting: Internal Medicine

## 2018-01-08 ENCOUNTER — Other Ambulatory Visit (INDEPENDENT_AMBULATORY_CARE_PROVIDER_SITE_OTHER): Payer: Federal, State, Local not specified - PPO

## 2018-01-08 VITALS — BP 100/70 | HR 63 | Temp 98.2°F | Ht 66.5 in | Wt 126.0 lb

## 2018-01-08 DIAGNOSIS — R634 Abnormal weight loss: Secondary | ICD-10-CM

## 2018-01-08 DIAGNOSIS — F32A Depression, unspecified: Secondary | ICD-10-CM

## 2018-01-08 DIAGNOSIS — J301 Allergic rhinitis due to pollen: Secondary | ICD-10-CM | POA: Diagnosis not present

## 2018-01-08 DIAGNOSIS — F329 Major depressive disorder, single episode, unspecified: Secondary | ICD-10-CM

## 2018-01-08 DIAGNOSIS — Z0000 Encounter for general adult medical examination without abnormal findings: Secondary | ICD-10-CM

## 2018-01-08 DIAGNOSIS — Z8619 Personal history of other infectious and parasitic diseases: Secondary | ICD-10-CM | POA: Diagnosis not present

## 2018-01-08 LAB — CBC
HCT: 39.3 % (ref 36.0–46.0)
HEMOGLOBIN: 13.2 g/dL (ref 12.0–15.0)
MCHC: 33.5 g/dL (ref 30.0–36.0)
MCV: 93.9 fl (ref 78.0–100.0)
PLATELETS: 209 10*3/uL (ref 150.0–400.0)
RBC: 4.19 Mil/uL (ref 3.87–5.11)
RDW: 13.7 % (ref 11.5–15.5)
WBC: 4.6 10*3/uL (ref 4.0–10.5)

## 2018-01-08 LAB — COMPREHENSIVE METABOLIC PANEL
ALBUMIN: 4.5 g/dL (ref 3.5–5.2)
ALT: 8 U/L (ref 0–35)
AST: 16 U/L (ref 0–37)
Alkaline Phosphatase: 74 U/L (ref 39–117)
BILIRUBIN TOTAL: 0.5 mg/dL (ref 0.2–1.2)
BUN: 12 mg/dL (ref 6–23)
CALCIUM: 9.3 mg/dL (ref 8.4–10.5)
CHLORIDE: 104 meq/L (ref 96–112)
CO2: 29 meq/L (ref 19–32)
CREATININE: 0.75 mg/dL (ref 0.40–1.20)
GFR: 83.14 mL/min (ref >60.00)
Glucose, Bld: 84 mg/dL (ref 70–99)
Potassium: 3.8 mEq/L (ref 3.5–5.1)
Sodium: 139 mEq/L (ref 135–145)
Total Protein: 7.7 g/dL (ref 6.0–8.3)

## 2018-01-08 LAB — LIPID PANEL
CHOLESTEROL: 170 mg/dL (ref 0–200)
HDL: 76.7 mg/dL (ref >39.00)
LDL Cholesterol: 81 mg/dL (ref 0–99)
NonHDL: 93.32
TRIGLYCERIDES: 62 mg/dL (ref 0.0–149.0)
Total CHOL/HDL Ratio: 2
VLDL: 12.4 mg/dL (ref 0.0–40.0)

## 2018-01-08 MED ORDER — MONTELUKAST SODIUM 10 MG PO TABS: 10.0000 mg | ORAL_TABLET | Freq: Every day | ORAL | 11 refills | Status: DC

## 2018-01-08 NOTE — Patient Instructions (Signed)
We will get the labs today and the CT of the stomach. We will get you back in with GI for evaluation and repeat colonoscopy as you are due this year.   Health Maintenance, Female Adopting a healthy lifestyle and getting preventive care can go a long way to promote health and wellness. Talk with your health care provider about what schedule of regular examinations is right for you. This is a good chance for you to check in with your provider about disease prevention and staying healthy. In between checkups, there are plenty of things you can do on your own. Experts have done a lot of research about which lifestyle changes and preventive measures are most likely to keep you healthy. Ask your health care provider for more information. Weight and diet Eat a healthy diet  Be sure to include plenty of vegetables, fruits, low-fat dairy products, and lean protein.  Do not eat a lot of foods high in solid fats, added sugars, or salt.  Get regular exercise. This is one of the most important things you can do for your health. ? Most adults should exercise for at least 150 minutes each week. The exercise should increase your heart rate and make you sweat (moderate-intensity exercise). ? Most adults should also do strengthening exercises at least twice a week. This is in addition to the moderate-intensity exercise.  Maintain a healthy weight  Body mass index (BMI) is a measurement that can be used to identify possible weight problems. It estimates body fat based on height and weight. Your health care provider can help determine your BMI and help you achieve or maintain a healthy weight.  For females 63 years of age and older: ? A BMI below 18.5 is considered underweight. ? A BMI of 18.5 to 24.9 is normal. ? A BMI of 25 to 29.9 is considered overweight. ? A BMI of 30 and above is considered obese.  Watch levels of cholesterol and blood lipids  You should start having your blood tested for lipids and  cholesterol at 63 years of age, then have this test every 5 years.  You may need to have your cholesterol levels checked more often if: ? Your lipid or cholesterol levels are high. ? You are older than 63 years of age. ? You are at high risk for heart disease.  Cancer screening Lung Cancer  Lung cancer screening is recommended for adults 63-11 years old who are at high risk for lung cancer because of a history of smoking.  A yearly low-dose CT scan of the lungs is recommended for people who: ? Currently smoke. ? Have quit within the past 15 years. ? Have at least a 30-pack-year history of smoking. A pack year is smoking an average of one pack of cigarettes a day for 1 year.  Yearly screening should continue until it has been 15 years since you quit.  Yearly screening should stop if you develop a health problem that would prevent you from having lung cancer treatment.  Breast Cancer  Practice breast self-awareness. This means understanding how your breasts normally appear and feel.  It also means doing regular breast self-exams. Let your health care provider know about any changes, no matter how small.  If you are in your 63 or 30s, you should have a clinical breast exam (CBE) by a health care provider every 1-3 years as part of a regular health exam.  If you are 63 or older, have a CBE every year. Also consider  having a breast X-ray (mammogram) every year.  If you have a family history of breast cancer, talk to your health care provider about genetic screening.  If you are at high risk for breast cancer, talk to your health care provider about having an MRI and a mammogram every year.  Breast cancer gene (BRCA) assessment is recommended for women who have family members with BRCA-related cancers. BRCA-related cancers include: ? Breast. ? Ovarian. ? Tubal. ? Peritoneal cancers.  Results of the assessment will determine the need for genetic counseling and BRCA1 and BRCA2  testing.  Cervical Cancer Your health care provider may recommend that you be screened regularly for cancer of the pelvic organs (ovaries, uterus, and vagina). This screening involves a pelvic examination, including checking for microscopic changes to the surface of your cervix (Pap test). You may be encouraged to have this screening done every 3 years, beginning at age 63.  For women ages 63-65, health care providers may recommend pelvic exams and Pap testing every 3 years, or they may recommend the Pap and pelvic exam, combined with testing for human papilloma virus (HPV), every 5 years. Some types of HPV increase your risk of cervical cancer. Testing for HPV may also be done on women of any age with unclear Pap test results.  Other health care providers may not recommend any screening for nonpregnant women who are considered low risk for pelvic cancer and who do not have symptoms. Ask your health care provider if a screening pelvic exam is right for you.  If you have had past treatment for cervical cancer or a condition that could lead to cancer, you need Pap tests and screening for cancer for at least 20 years after your treatment. If Pap tests have been discontinued, your risk factors (such as having a new sexual partner) need to be reassessed to determine if screening should resume. Some women have medical problems that increase the chance of getting cervical cancer. In these cases, your health care provider may recommend more frequent screening and Pap tests.  Colorectal Cancer  This type of cancer can be detected and often prevented.  Routine colorectal cancer screening usually begins at 63 years of age and continues through 64 years of age.  Your health care provider may recommend screening at an earlier age if you have risk factors for colon cancer.  Your health care provider may also recommend using home test kits to check for hidden blood in the stool.  A small camera at the end of a  tube can be used to examine your colon directly (sigmoidoscopy or colonoscopy). This is done to check for the earliest forms of colorectal cancer.  Routine screening usually begins at age 25.  Direct examination of the colon should be repeated every 5-10 years through 63 years of age. However, you may need to be screened more often if early forms of precancerous polyps or small growths are found.  Skin Cancer  Check your skin from head to toe regularly.  Tell your health care provider about any new moles or changes in moles, especially if there is a change in a mole's shape or color.  Also tell your health care provider if you have a mole that is larger than the size of a pencil eraser.  Always use sunscreen. Apply sunscreen liberally and repeatedly throughout the day.  Protect yourself by wearing long sleeves, pants, a wide-brimmed hat, and sunglasses whenever you are outside.  Heart disease, diabetes, and high blood pressure  High blood pressure causes heart disease and increases the risk of stroke. High blood pressure is more likely to develop in: ? People who have blood pressure in the high end of the normal range (130-139/85-89 mm Hg). ? People who are overweight or obese. ? People who are African American.  If you are 97-43 years of age, have your blood pressure checked every 3-5 years. If you are 34 years of age or older, have your blood pressure checked every year. You should have your blood pressure measured twice-once when you are at a hospital or clinic, and once when you are not at a hospital or clinic. Record the average of the two measurements. To check your blood pressure when you are not at a hospital or clinic, you can use: ? An automated blood pressure machine at a pharmacy. ? A home blood pressure monitor.  If you are between 39 years and 85 years old, ask your health care provider if you should take aspirin to prevent strokes.  Have regular diabetes screenings. This  involves taking a blood sample to check your fasting blood sugar level. ? If you are at a normal weight and have a low risk for diabetes, have this test once every three years after 63 years of age. ? If you are overweight and have a high risk for diabetes, consider being tested at a younger age or more often. Preventing infection Hepatitis B  If you have a higher risk for hepatitis B, you should be screened for this virus. You are considered at high risk for hepatitis B if: ? You were born in a country where hepatitis B is common. Ask your health care provider which countries are considered high risk. ? Your parents were born in a high-risk country, and you have not been immunized against hepatitis B (hepatitis B vaccine). ? You have HIV or AIDS. ? You use needles to inject street drugs. ? You live with someone who has hepatitis B. ? You have had sex with someone who has hepatitis B. ? You get hemodialysis treatment. ? You take certain medicines for conditions, including cancer, organ transplantation, and autoimmune conditions.  Hepatitis C  Blood testing is recommended for: ? Everyone born from [birth year] through 1966. ? Anyone with known risk factors for hepatitis C.  Sexually transmitted infections (STIs)  You should be screened for sexually transmitted infections (STIs) including gonorrhea and chlamydia if: ? You are sexually active and are younger than 63 years of age. ? You are older than 63 years of age and your health care provider tells you that you are at risk for this type of infection. ? Your sexual activity has changed since you were last screened and you are at an increased risk for chlamydia or gonorrhea. Ask your health care provider if you are at risk.  If you do not have HIV, but are at risk, it may be recommended that you take a prescription medicine daily to prevent HIV infection. This is called pre-exposure prophylaxis (PrEP). You are considered at risk if: ? You are  sexually active and do not regularly use condoms or know the HIV status of your partner(s). ? You take drugs by injection. ? You are sexually active with a partner who has HIV.  Talk with your health care provider about whether you are at high risk of being infected with HIV. If you choose to begin PrEP, you should first be tested for HIV. You should then be tested every 3 months  for as long as you are taking PrEP. Pregnancy  If you are premenopausal and you may become pregnant, ask your health care provider about preconception counseling.  If you may become pregnant, take 400 to 800 micrograms (mcg) of folic acid every day.  If you want to prevent pregnancy, talk to your health care provider about birth control (contraception). Osteoporosis and menopause  Osteoporosis is a disease in which the bones lose minerals and strength with aging. This can result in serious bone fractures. Your risk for osteoporosis can be identified using a bone density scan.  If you are 29 years of age or older, or if you are at risk for osteoporosis and fractures, ask your health care provider if you should be screened.  Ask your health care provider whether you should take a calcium or vitamin D supplement to lower your risk for osteoporosis.  Menopause may have certain physical symptoms and risks.  Hormone replacement therapy may reduce some of these symptoms and risks. Talk to your health care provider about whether hormone replacement therapy is right for you. Follow these instructions at home:  Schedule regular health, dental, and eye exams.  Stay current with your immunizations.  Do not use any tobacco products including cigarettes, chewing tobacco, or electronic cigarettes.  If you are pregnant, do not drink alcohol.  If you are breastfeeding, limit how much and how often you drink alcohol.  Limit alcohol intake to no more than 1 drink per day for nonpregnant women. One drink equals 12 ounces of  beer, 5 ounces of wine, or 1 ounces of hard liquor.  Do not use street drugs.  Do not share needles.  Ask your health care provider for help if you need support or information about quitting drugs.  Tell your health care provider if you often feel depressed.  Tell your health care provider if you have ever been abused or do not feel safe at home. This information is not intended to replace advice given to you by your health care provider. Make sure you discuss any questions you have with your health care provider. Document Released: 04/01/2011 Document Revised: 02/22/2016 Document Reviewed: 06/20/2015 Elsevier Interactive Patient Education  [employer].

## 2018-01-08 NOTE — Progress Notes (Addendum)
   Subjective:    Patient ID: Monique [name], female    DOB: October 18, 1955, 62 y.o.   MRN: [id number]  HPI The patient is a 62 YO female coming in for wellness. Some new GI symptoms of gas, bloating, loose stools intermittent. Last colonoscopy 2014 and due with family history of GI cancer. No blood in stool.   PMH, [facility], social history reviewed and updated.   Review of Systems  Constitutional: Negative.   HENT: Negative.   Eyes: Negative.   Respiratory: Negative for cough, chest tightness and shortness of breath.   Cardiovascular: Negative for chest pain, palpitations and leg swelling.  Gastrointestinal: Positive for abdominal distention, abdominal pain, constipation and diarrhea. Negative for nausea and vomiting.  Musculoskeletal: Negative.   Skin: Negative.   Neurological: Negative.   Psychiatric/Behavioral: Negative.       Objective:   Physical Exam  Constitutional: She is oriented to person, place, and time. She appears well-developed and well-nourished.  HENT:  Head: Normocephalic and atraumatic.  Eyes: EOM are normal.  Neck: Normal range of motion.  Cardiovascular: Normal rate and regular rhythm.  Pulmonary/Chest: Effort normal and breath sounds normal. No respiratory distress. She has no wheezes. She has no rales.  Abdominal: Soft. Bowel sounds are normal. She exhibits no distension. There is no tenderness. There is no rebound.  Musculoskeletal: She exhibits no edema.  Neurological: She is alert and oriented to person, place, and time. Coordination normal.  Skin: Skin is warm and dry.  Psychiatric: She has a normal mood and affect.   Vitals:   01/08/18 1055  BP: 100/70  Pulse: 63  Temp: 98.2 F (36.8 C)  TempSrc: Oral  SpO2: 96%  Weight: 126 lb (57.2 kg)  Height: 5' 6.5" (1.689 m)      Assessment & Plan:

## 2018-01-09 MED ORDER — METOPROLOL TARTRATE 50 MG PO TABS: 50.0000 mg | ORAL_TABLET | Freq: Two times a day (BID) | ORAL | 0 refills | Status: DC

## 2018-01-09 NOTE — Assessment & Plan Note (Signed)
Underwent treatment in 2002 but checking quant to make sure it is remission. Checking CMP.

## 2018-01-09 NOTE — Assessment & Plan Note (Signed)
Using flonase.

## 2018-01-09 NOTE — Assessment & Plan Note (Signed)
Given history of hep c and no results in our computer system will check quant hep c to see if active. Referral to GI ordered due to new GI symptoms and family history she is due for colonoscopy in 2019 November. Gets pap and mammogram at gyn. Reminded about sun safety and hearing protection. Given screening recommendations.

## 2018-01-09 NOTE — Assessment & Plan Note (Signed)
Doing well currently.

## 2018-01-15 LAB — HEPATITIS C RNA QUANTITATIVE
HCV QUANT LOG: NOT DETECTED {Log_IU}/mL
HCV RNA, PCR, QN: NOT DETECTED [IU]/mL

## 2018-01-15 LAB — RETICULIN ANTIBODIES, IGA W TITER: RETICULIN IGA SCREEN: NEGATIVE

## 2018-01-15 LAB — TISSUE TRANSGLUTAMINASE, IGA: (tTG) Ab, IgA: 1 U/mL

## 2018-01-15 LAB — GLIADIN ANTIBODIES, SERUM
GLIADIN IGG: 3 U
Gliadin IgA: 20 Units — ABNORMAL HIGH

## 2018-01-22 ENCOUNTER — Inpatient Hospital Stay: Admission: RE | Admit: 2018-01-22 | Payer: Federal, State, Local not specified - PPO | Source: Ambulatory Visit

## 2018-01-26 ENCOUNTER — Ambulatory Visit (INDEPENDENT_AMBULATORY_CARE_PROVIDER_SITE_OTHER)
Admission: RE | Admit: 2018-01-26 | Discharge: 2018-01-26 | Disposition: A | Discharge status: Home / Self Care | Payer: Federal, State, Local not specified - PPO | Source: Ambulatory Visit | Attending: Internal Medicine | Admitting: Internal Medicine

## 2018-01-26 DIAGNOSIS — R634 Abnormal weight loss: Secondary | ICD-10-CM | POA: Diagnosis not present

## 2018-01-26 DIAGNOSIS — R140 Abdominal distension (gaseous): Secondary | ICD-10-CM | POA: Diagnosis not present

## 2018-01-26 MED ORDER — IOPAMIDOL (ISOVUE-300) INJECTION 61%
100.0000 mL | Freq: Once | INTRAVENOUS | Status: AC | PRN
  Administered 2018-01-26: 100 mL via INTRAVENOUS

## 2018-02-04 ENCOUNTER — Other Ambulatory Visit: Payer: Self-pay | Admitting: Internal Medicine

## 2018-02-05 NOTE — Telephone Encounter (Signed)
Control database checked last refill: 12/22/2017 LOV: Physical 01/08/2018

## 2018-02-11 ENCOUNTER — Encounter: Payer: Self-pay | Admitting: Internal Medicine

## 2018-02-16 ENCOUNTER — Ambulatory Visit: Payer: Federal, State, Local not specified - PPO | Admitting: Internal Medicine

## 2018-02-16 ENCOUNTER — Encounter: Payer: Self-pay | Admitting: Internal Medicine

## 2018-02-16 VITALS — BP 100/70 | HR 60 | Temp 97.9°F | Ht 66.5 in | Wt 130.0 lb

## 2018-02-16 DIAGNOSIS — M150 Primary generalized (osteo)arthritis: Secondary | ICD-10-CM

## 2018-02-16 DIAGNOSIS — M159 Polyosteoarthritis, unspecified: Secondary | ICD-10-CM

## 2018-02-16 DIAGNOSIS — M8949 Other hypertrophic osteoarthropathy, multiple sites: Secondary | ICD-10-CM

## 2018-02-16 MED ORDER — TRAMADOL HCL 50 MG PO TABS: 50.0000 mg | ORAL_TABLET | Freq: Every day | ORAL | 5 refills | Status: DC | PRN

## 2018-02-16 NOTE — Patient Instructions (Signed)
We have sent in the refills of the tramadol.

## 2018-02-16 NOTE — Progress Notes (Signed)
   Subjective:    Patient ID: Monique [name], female    DOB: 05-May-1956, 62 y.o.   MRN: [id number]  HPI The patient is a 62 YO female coming in for follow up of her arthritis. She does take tramadol for this pain up to daily as needed. She is using that as least often as possible. She denies side effects such as drowsiness, constipation, itching, rash, nausea. She does get good pain relief from it when needed. No more often than 1-2 times per week. Overall satisfied with the relief. She has started walking more regularly now to help reduce pain overall. She is aware of the risk of addiction and uses rarely.   Review of Systems  Constitutional: Negative.   HENT: Negative.   Eyes: Negative.   Respiratory: Negative for cough, chest tightness and shortness of breath.   Cardiovascular: Negative for chest pain, palpitations and leg swelling.  Gastrointestinal: Negative for abdominal distention, abdominal pain, constipation, diarrhea, nausea and vomiting.  Musculoskeletal: Positive for arthralgias and myalgias.  Skin: Negative.   Neurological: Negative.   Psychiatric/Behavioral: Negative.       Objective:   Physical Exam  Constitutional: She is oriented to person, place, and time. She appears well-developed and well-nourished.  HENT:  Head: Normocephalic and atraumatic.  Eyes: EOM are normal.  Neck: Normal range of motion.  Cardiovascular: Normal rate and regular rhythm.  Pulmonary/Chest: Effort normal and breath sounds normal. No respiratory distress. She has no wheezes. She has no rales.  Abdominal: Soft. Bowel sounds are normal. She exhibits no distension. There is no tenderness. There is no rebound.  Musculoskeletal: She exhibits no edema.  Neurological: She is alert and oriented to person, place, and time. Coordination normal.  Skin: Skin is warm and dry.  Psychiatric: She has a normal mood and affect.   Vitals:   02/16/18 1038  BP: 100/70  Pulse: 60  Temp: 97.9 F (36.6 C)    TempSrc: Oral  SpO2: 99%  Weight: 130 lb (59 kg)  Height: 5' 6.5" (1.689 m)      Assessment & Plan:

## 2018-02-16 NOTE — Assessment & Plan Note (Signed)
Using tramadol rarely, refilled today. Fills #15 about every 2 months or so. Reviewed [place] narcotics database and no inappropriate fills. She is aware of risk of dependence, addiction with this medication and elects to continue with as needed therapy.

## 2018-03-02 DIAGNOSIS — F3181 Bipolar II disorder: Secondary | ICD-10-CM | POA: Diagnosis not present

## 2018-03-02 DIAGNOSIS — F411 Generalized anxiety disorder: Secondary | ICD-10-CM | POA: Diagnosis not present

## 2018-03-06 ENCOUNTER — Encounter: Payer: Self-pay | Admitting: Internal Medicine

## 2018-03-30 ENCOUNTER — Telehealth: Payer: Self-pay | Admitting: *Deleted

## 2018-03-30 MED ORDER — VALACYCLOVIR HCL 1 G PO TABS: 1000.0000 mg | ORAL_TABLET | Freq: Every day | ORAL | 0 refills | Status: DC

## 2018-03-30 NOTE — Telephone Encounter (Signed)
Patient has annual exam scheduled on 05/26/18, needs refill on valtrex 1000 mg, [facility] chart has arrived. Rx sent.

## 2018-04-08 DIAGNOSIS — M545 Low back pain: Secondary | ICD-10-CM | POA: Diagnosis not present

## 2018-04-13 ENCOUNTER — Ambulatory Visit: Payer: Federal, State, Local not specified - PPO | Admitting: Nurse Practitioner

## 2018-04-13 ENCOUNTER — Encounter: Payer: Self-pay | Admitting: Nurse Practitioner

## 2018-04-13 VITALS — BP 108/64 | HR 60 | Ht 67.0 in | Wt 129.8 lb

## 2018-04-13 DIAGNOSIS — R140 Abdominal distension (gaseous): Secondary | ICD-10-CM | POA: Diagnosis not present

## 2018-04-13 DIAGNOSIS — Z800 Family history of malignant neoplasm of digestive organs: Secondary | ICD-10-CM

## 2018-04-13 DIAGNOSIS — R143 Flatulence: Secondary | ICD-10-CM | POA: Diagnosis not present

## 2018-04-13 NOTE — Patient Instructions (Signed)
If you are age 63 or older, your body mass index should be between 23-30. Your Body mass index is 20.33 kg/m. If this is out of the aforementioned range listed, please consider follow up with your Primary Care Provider.  If you are age 70 or younger, your body mass index should be between 19-25. Your Body mass index is 20.33 kg/m. If this is out of the aformentioned range listed, please consider follow up with your Primary Care Provider.   Please go to [place] and pick up Visbiome (over-the-counter) Take as follows: Take 2 twice daily for 2 weeks, then 2 daily for 3 months.  You have been given a Low [place] Diet.  Call in a few weeks if not better.  Thank you for choosing me and [facility].   Tye Savoy, NP

## 2018-04-13 NOTE — Progress Notes (Addendum)
Chief Complaint: gas / bloating  Referring Provider:    self    ASSESSMENT AND PLAN;    1. 62 yo female with several month history of bloating / belching / excessive gas. Symptoms have already improved after making dietary changes to include reduction in diary, beans, and cabbage. No problems with wheat consumption.  -we discussed role of certain foods in production of gas. She has found that sweets cause increased gas. I introduced low FODMAP diet to her and she plans to try it. She understands that it will take time to determine which foods she doesn't tolerate.   -In the interim she is interested in trying probiotics. Recommended VSL # 3 (Visbiome). She will take two bid for 14 days then two daily for next few months.   I spent 25 minutes of face-to-face time with the patient. Greater than 50% of the time was spent counseling and coordinating care. Questions answered  2. Family history of colon cancer impairment.  She is due for surveillance colonoscopy in November of this year  Agree with Monique [name] assessment and plan. Monique Mayer, MD, Monique [name]    HPI:    Patient is a 62 yo female with a hx of HCV (treated) and a [facility] of colon cancer. I saw in 2013 for evaluation of dysphagia. She subsequently underwent EGD with empirical dilation. Monique [name] has a [facility] of colon cancer in a parent and is due for surveillance colonoscopy this coming November.   Monique [name] comes in for evaluation of bloating and gas / belching.  Symptoms present for several months.  No associated nausea / vomiting. After making some dietary changes such as reduction in beans, cabbage and dairy her symptoms have improved. Beano helps as well. Wheat doesn't seem to bother her. She has mild constipation, improved with colace. She often gets lower abdominal pain but it is relieved with defecation. Her weight is stable.    Past Medical History:  Diagnosis Date  . Abnormal electrocardiogram   . Allergic rhinitis   .  Anxiety   . Bipolar disorder ([place])   . Depression    Some  . History of hepatitis C    Hep B also  . History of skin cancer   . HSV infection   . Memory loss   . MVP (mitral valve prolapse)   . Paresthesia   . Unspecified chronic bronchitis ([place])      Past Surgical History:  Procedure Laterality Date  . APPENDECTOMY  11/08  . MOHS SURGERY    . RHINOPLASTY    . TUBAL LIGATION     bilateral   Family History  Problem Relation Age of Onset  . COPD Mother   . Allergies Mother   . Heart disease Mother   . Atrial fibrillation Mother   . Heart failure Mother   . Colon cancer Mother 69       Had 6 inches, resected.  Marland Kitchen COPD Paternal Uncle   . Allergies Father   . Rheum arthritis Father   . Bladder Cancer Father   . Asthma Unknown    Social History   Tobacco Use  . Smoking status: Former Smoker    Packs/day: 1.50    Years: 25.00    Pack years: 37.50    Types: Cigarettes    Last attempt to quit: 09/30/1996    Years since quitting: 21.5  . Smokeless tobacco: Never Used  Substance Use Topics  . Alcohol use: Yes  Comment: rare  . Drug use: No   Current Outpatient Medications  Medication Sig Dispense Refill  . acyclovir ointment (ZOVIRAX) 5 % Apply 1 application topically every 3 (three) hours. 15 g 0  . b complex vitamins tablet Take 1 tablet by mouth daily.    . Calcium Carbonate-Vit D-Min (CALCIUM 1200 PO) Take by mouth. Take 1 tab once daily.    . Cholecalciferol (VITAMIN D3) 1000 UNITS CHEW Chew 1 tablet by mouth 2 (two) times daily.    . fluticasone (FLONASE) 50 MCG/ACT nasal spray Place 2 sprays into both nostrils daily. 16 g 11  . lamoTRIgine (LAMICTAL) 100 MG tablet Take 100 mg by mouth daily. Take 1/2 tablet for 50 mg daily.    Marland Kitchen LORazepam (ATIVAN) 1 MG tablet Take 1-2 mg by mouth at bedtime. And as needed during the day    . metoprolol tartrate (LOPRESSOR) 50 MG tablet Take 1 tablet (50 mg total) by mouth 2 (two) times daily. --patient has March appt with dr  crawford 60 tablet 0  . montelukast (SINGULAIR) 10 MG tablet Take 1 tablet (10 mg total) by mouth at bedtime. 30 tablet 11  . Omega-3 Fatty Acids (FISH OIL) 1000 MG CAPS Take by mouth. Take 1 cap by mouth daily.    . QUEtiapine Fumarate (SEROQUEL PO) Take 50 mg by mouth. Take 1 tab at bedtime daily.    . sertraline (ZOLOFT) 50 MG tablet Take 50 mg by mouth daily. Take 1 tabv by mouth every morning.    . sodium chloride (OCEAN) 0.65 % SOLN nasal spray Place 1 spray into both nostrils as needed for congestion.    . traMADol (ULTRAM) 50 MG tablet Take 1 tablet (50 mg total) by mouth daily as needed. 15 tablet 5  . valACYclovir (VALTREX) 1000 MG tablet Take 1 tablet (1,000 mg total) by mouth daily. 90 tablet 0  . cetirizine (ZYRTEC) 10 MG tablet Take 10 mg by mouth daily.     No current facility-administered medications for this visit.    No Known Allergies   Review of Systems: Positive for allergy, sinus trouble, anxiety, arthritis, back pain, cough, fatigue and nosebleeds Al other systems reviewed and negative except where noted in HPI.   Creatinine clearance cannot be calculated (Patient's most recent lab result is older than the maximum 21 days allowed.)   Physical Exam:    Wt Readings from Last 3 Encounters:  04/13/18 129 lb 12.8 oz (58.9 kg)  02/16/18 130 lb (59 kg)  01/08/18 126 lb (57.2 kg)    BP 108/64   Pulse 60   Ht 5\' 7"  (1.702 m)   Wt 129 lb 12.8 oz (58.9 kg)   BMI 20.33 kg/m  Constitutional:  Pleasant female in no acute distress. Psychiatric: Normal mood and affect. Behavior is normal. EENT: Pupils normal.  Conjunctivae are normal. No scleral icterus. Neck supple.  Cardiovascular: Normal rate, regular rhythm. No edema Pulmonary/chest: Effort normal and breath sounds normal. No wheezing, rales or rhonchi. Abdominal: Soft, nondistended, nontender. Bowel sounds active throughout. There are no masses palpable. No hepatomegaly. Neurological: Alert and oriented to person  place and time. Skin: Skin is warm and dry. No rashes noted.  Monique Savoy, NP  04/13/2018, 11:39 AM  Cc: Hoyt Koch, *

## 2018-04-14 ENCOUNTER — Encounter: Payer: Self-pay | Admitting: Nurse Practitioner

## 2018-05-26 ENCOUNTER — Encounter: Payer: Federal, State, Local not specified - PPO | Admitting: Obstetrics & Gynecology

## 2018-06-02 DIAGNOSIS — F411 Generalized anxiety disorder: Secondary | ICD-10-CM | POA: Diagnosis not present

## 2018-06-02 DIAGNOSIS — F3181 Bipolar II disorder: Secondary | ICD-10-CM | POA: Diagnosis not present

## 2018-06-03 ENCOUNTER — Telehealth: Payer: Self-pay | Admitting: Nurse Practitioner

## 2018-06-04 NOTE — Telephone Encounter (Signed)
Kit at the front desk for patient pick up. Message left for the patient.

## 2018-06-05 NOTE — Telephone Encounter (Signed)
Paula, the [employer] "Easy Sampler" breath collection kit. These are located in your pod. These are the only ones available at our office. She has the Lactulose Test Breath Test. 

## 2018-06-05 NOTE — Telephone Encounter (Signed)
Beth, can you tell me what was left at front desk?  SIBO test form? Thanks

## 2018-06-08 NOTE — Telephone Encounter (Signed)
Thanks Beth 

## 2018-06-23 ENCOUNTER — Encounter: Payer: Self-pay | Admitting: Nurse Practitioner

## 2018-07-01 ENCOUNTER — Other Ambulatory Visit: Payer: Self-pay | Admitting: Obstetrics & Gynecology

## 2018-07-01 NOTE — Telephone Encounter (Signed)
Never been seen here but her [employer] chart is here. She is scheduled to see you for CE on 09/28/2018.

## 2018-07-03 ENCOUNTER — Encounter: Payer: Federal, State, Local not specified - PPO | Admitting: Obstetrics & Gynecology

## 2018-07-09 ENCOUNTER — Other Ambulatory Visit: Payer: Self-pay

## 2018-07-09 DIAGNOSIS — K580 Irritable bowel syndrome with diarrhea: Secondary | ICD-10-CM

## 2018-07-09 MED ORDER — RIFAXIMIN 550 MG PO TABS: 550.0000 mg | ORAL_TABLET | Freq: Three times a day (TID) | ORAL | 0 refills | Status: AC

## 2018-08-03 DIAGNOSIS — H40013 Open angle with borderline findings, low risk, bilateral: Secondary | ICD-10-CM | POA: Diagnosis not present

## 2018-08-03 DIAGNOSIS — H25813 Combined forms of age-related cataract, bilateral: Secondary | ICD-10-CM | POA: Diagnosis not present

## 2018-08-03 DIAGNOSIS — H04123 Dry eye syndrome of bilateral lacrimal glands: Secondary | ICD-10-CM | POA: Diagnosis not present

## 2018-08-14 ENCOUNTER — Other Ambulatory Visit: Payer: Self-pay | Admitting: Internal Medicine

## 2018-09-14 ENCOUNTER — Other Ambulatory Visit: Payer: Self-pay | Admitting: Internal Medicine

## 2018-09-14 NOTE — Telephone Encounter (Signed)
Control database checked last refill: 12/22/2017 LOV: 02/16/2018 acute, CPE 01/08/2018 NOV: none

## 2018-09-28 ENCOUNTER — Ambulatory Visit: Payer: Federal, State, Local not specified - PPO | Admitting: Obstetrics & Gynecology

## 2018-09-28 ENCOUNTER — Encounter: Payer: Self-pay | Admitting: Obstetrics & Gynecology

## 2018-09-28 VITALS — BP 128/76 | Ht 66.25 in | Wt 128.0 lb

## 2018-09-28 DIAGNOSIS — Z780 Asymptomatic menopausal state: Secondary | ICD-10-CM

## 2018-09-28 DIAGNOSIS — Z01419 Encounter for gynecological examination (general) (routine) without abnormal findings: Secondary | ICD-10-CM

## 2018-09-28 DIAGNOSIS — Z13820 Encounter for screening for osteoporosis: Secondary | ICD-10-CM | POA: Diagnosis not present

## 2018-09-28 NOTE — Progress Notes (Signed)
Monique [name] Sep 02, 1956 [id number]   History:    62 y.o. G0 Married  RP:  Established patient presenting for annual gyn exam   HPI: Menopause, well on no HRT.  No PMB.  No pelvic pain.  Abstinent.  Just got a vibrator, painful.  Urine and bowel movements normal.  Breast normal.  Body mass index 20.5.  In good fitness.  Health labs with family physician.  Past medical history,surgical history, family history and social history were all reviewed and documented in the EPIC chart.  Gynecologic History No LMP recorded. Patient is postmenopausal. Contraception: post menopausal status Last Pap: 06/2014. Results were: Negative/HPV HR neg Last mammogram: 11/2016. Results were: Negative Bone Density: 08/2013 Colonoscopy: 2009.  Will schedule  Obstetric History OB History  Gravida Para Term Preterm AB Living  0 0 0 0 0 0  SAB TAB Ectopic Multiple Live Births  0 0 0 0 0     ROS: A ROS was performed and pertinent positives and negatives are included in the history.  GENERAL: No fevers or chills. HEENT: No change in vision, no earache, sore throat or sinus congestion. NECK: No pain or stiffness. CARDIOVASCULAR: No chest pain or pressure. No palpitations. PULMONARY: No shortness of breath, cough or wheeze. GASTROINTESTINAL: No abdominal pain, nausea, vomiting or diarrhea, melena or bright red blood per rectum. GENITOURINARY: No urinary frequency, urgency, hesitancy or dysuria. MUSCULOSKELETAL: No joint or muscle pain, no back pain, no recent trauma. DERMATOLOGIC: No rash, no itching, no lesions. ENDOCRINE: No polyuria, polydipsia, no heat or cold intolerance. No recent change in weight. HEMATOLOGICAL: No anemia or easy bruising or bleeding. NEUROLOGIC: No headache, seizures, numbness, tingling or weakness. PSYCHIATRIC: No depression, no loss of interest in normal activity or change in sleep pattern.     Exam:   BP 128/76   Ht 5' 6.25" (1.683 m)   Wt 128 lb (58.1 kg)   BMI 20.50 kg/m    Body mass index is 20.5 kg/m.  General appearance : Well developed well nourished female. No acute distress HEENT: Eyes: no retinal hemorrhage or exudates,  Neck supple, trachea midline, no carotid bruits, no thyroidmegaly Lungs: Clear to auscultation, no rhonchi or wheezes, or rib retractions  Heart: Regular rate and rhythm, no murmurs or gallops Breast:Examined in sitting and supine position were symmetrical in appearance, no palpable masses or tenderness,  no skin retraction, no nipple inversion, no nipple discharge, no skin discoloration, no axillary or supraclavicular lymphadenopathy Abdomen: no palpable masses or tenderness, no rebound or guarding Extremities: no edema or skin discoloration or tenderness  Pelvic: Vulva: Normal             Vagina: No gross lesions or discharge  Cervix: No gross lesions or discharge.  Pap reflex done  Uterus  AV, normal size, shape and consistency, non-tender and mobile  Adnexa  Without masses or tenderness  Anus: Normal   Assessment/Plan:  62 y.o. female for annual exam   1. Encounter for routine gynecological examination with Papanicolaou smear of cervix Normal gynecologic exam.  Pap reflex done.  Breast exam normal.  Last screening mammogram March 2018 was negative.  Patient will schedule screening mammogram now.  Colonoscopy in 2009.  Will organize a colonoscopy through her family physician.  Health labs with family physician.  2. Postmenopausal Well on no hormone replacement therapy.  No postmenopausal bleeding.  Abstinent but using a vibrator and experiencing pain with dryness.  Recommend coconut oil.  If not sufficient, will call to  have estradiol cream prescribed.  3. Screening for osteoporosis Last bone density in 2014.  Will repeat here now.  Vitamin D supplements, calcium intake of 1.5 g/day including nutritional and supplemental, regular weightbearing physical activities recommended. - DG Bone Density; Future  Princess Bruins MD,  4:08 PM 09/28/2018

## 2018-09-28 NOTE — Patient Instructions (Signed)
1. Encounter for routine gynecological examination with Papanicolaou smear of cervix Normal gynecologic exam.  Pap reflex done.  Breast exam normal.  Last screening mammogram March 2018 was negative.  Patient will schedule screening mammogram now.  Colonoscopy in 2009.  Will organize a colonoscopy through her family physician.  Health labs with family physician.  2. Postmenopausal Well on no hormone replacement therapy.  No postmenopausal bleeding.  Abstinent but using a vibrator and experiencing pain with dryness.  Recommend coconut oil.  If not sufficient, will call to have estradiol cream prescribed.  3. Screening for osteoporosis Last bone density in 2014.  Will repeat here now.  Vitamin D supplements, calcium intake of 1.5 g/day including nutritional and supplemental, regular weightbearing physical activities recommended. - DG Bone Density; Future  [name], it was a pleasure seeing you today!  I will inform you of your results as soon as they are available.

## 2018-09-29 LAB — PAP IG W/ RFLX HPV ASCU

## 2018-10-15 ENCOUNTER — Other Ambulatory Visit: Payer: Self-pay | Admitting: Internal Medicine

## 2018-10-15 NOTE — Telephone Encounter (Signed)
Done erx 

## 2018-10-15 NOTE — Telephone Encounter (Signed)
Please advise per Dr. Nathanial Millman absence. Thank you   Control database checked last refill: 09/14/2018 LOV: 02/16/2018 NOV: none

## 2018-10-26 DIAGNOSIS — F3181 Bipolar II disorder: Secondary | ICD-10-CM | POA: Diagnosis not present

## 2018-10-26 DIAGNOSIS — F411 Generalized anxiety disorder: Secondary | ICD-10-CM | POA: Diagnosis not present

## 2018-10-27 ENCOUNTER — Other Ambulatory Visit: Payer: Self-pay | Admitting: Dermatology

## 2018-10-27 DIAGNOSIS — L570 Actinic keratosis: Secondary | ICD-10-CM | POA: Diagnosis not present

## 2018-10-27 DIAGNOSIS — D229 Melanocytic nevi, unspecified: Secondary | ICD-10-CM | POA: Diagnosis not present

## 2018-10-27 DIAGNOSIS — C44619 Basal cell carcinoma of skin of left upper limb, including shoulder: Secondary | ICD-10-CM | POA: Diagnosis not present

## 2018-10-27 DIAGNOSIS — D485 Neoplasm of uncertain behavior of skin: Secondary | ICD-10-CM | POA: Diagnosis not present

## 2018-10-27 DIAGNOSIS — C44612 Basal cell carcinoma of skin of right upper limb, including shoulder: Secondary | ICD-10-CM | POA: Diagnosis not present

## 2018-11-19 ENCOUNTER — Ambulatory Visit (INDEPENDENT_AMBULATORY_CARE_PROVIDER_SITE_OTHER): Payer: Federal, State, Local not specified - PPO

## 2018-11-19 ENCOUNTER — Other Ambulatory Visit: Payer: Self-pay | Admitting: Obstetrics & Gynecology

## 2018-11-19 DIAGNOSIS — Z780 Asymptomatic menopausal state: Secondary | ICD-10-CM

## 2018-11-19 DIAGNOSIS — M810 Age-related osteoporosis without current pathological fracture: Secondary | ICD-10-CM

## 2018-11-19 DIAGNOSIS — Z13820 Encounter for screening for osteoporosis: Secondary | ICD-10-CM

## 2018-12-03 ENCOUNTER — Encounter: Payer: Self-pay | Admitting: Internal Medicine

## 2018-12-18 ENCOUNTER — Encounter: Payer: Self-pay | Admitting: *Deleted

## 2018-12-24 ENCOUNTER — Other Ambulatory Visit: Payer: Self-pay

## 2018-12-24 ENCOUNTER — Telehealth (INDEPENDENT_AMBULATORY_CARE_PROVIDER_SITE_OTHER): Payer: Federal, State, Local not specified - PPO | Admitting: Obstetrics & Gynecology

## 2018-12-24 DIAGNOSIS — M810 Age-related osteoporosis without current pathological fracture: Secondary | ICD-10-CM | POA: Diagnosis not present

## 2018-12-24 MED ORDER — ALENDRONATE SODIUM 70 MG PO TABS: 70.0000 mg | ORAL_TABLET | ORAL | 4 refills | Status: DC

## 2018-12-24 NOTE — Progress Notes (Addendum)
    Monique [name] 02/16/1956 [id number]        63 y.o.  G0  Married.    Telephone visit conducted between patient and myself only, as I was in my office and patient was in her home.  Duration of the visit including the phone consultation and the review of chart was 26 minutes.   Patient consented to a telephone visit.   RP:  Counseling and management of Osteoporosis  HPI: Menopause, well on no HRT.  No PMB.  Taking Ca++/Vit D supplements.  Mild physical activity.  BMI 20.50.  No h/o fragility fracture.  Good balance.   OB History  Gravida Para Term Preterm AB Living  0 0 0 0 0 0  SAB TAB Ectopic Multiple Live Births  0 0 0 0 0    Past medical history,surgical history, problem list, medications, allergies, family history and social history were all reviewed and documented in the EPIC chart.   Directed ROS with pertinent positives and negatives documented in the history of present illness/assessment and plan.  Exam:  There were no vitals filed for this visit. General appearance:  Normal  Bone Density 11/19/2018:  Osteoporosis at multiple sites, lowest at Rt Femoral Neck T-Score -2.8   Assessment/Plan:  63 y.o. G0P0000   1. Age-related osteoporosis without current pathological fracture New diagnosis of osteoporosis on last bone density November 19, 2018.  Osteoporosis at multiple sites with the lowest T score at -2.8 at the right femoral neck.  Patient is menopausal on no hormone replacement therapy.  Has never been on bone medication.  Counseling done on the risk of osteoporosis and the importance of continuing on vitamin D supplements as well as a calcium intake of 1200 to 1500 mg every day including nutritional and supplemental calcium.  Benefits of regular weightbearing physical activity also reviewed.  Different medical treatments discussed with patient and decision to start with Fosamax 70 mg 1 tablet every week.  Risks and benefits of Fosamax discussed including the small  risk of jaw necrosis.  Patient informed to stop Fosamax if major dental work is to be done.  Usage reviewed with patient.  Prescription sent to pharmacy.  Patient will come back after the coronavirus pandemic for a vitamin D level. - VITAMIN D 25 Hydroxy (Vit-D Deficiency, Fractures); Future  Other orders - alendronate (FOSAMAX) 70 MG tablet; Take 1 tablet (70 mg total) by mouth once a week. Take with a full glass of water on an empty stomach.   Princess Bruins MD, 2:39 PM 12/24/2018

## 2018-12-26 ENCOUNTER — Other Ambulatory Visit: Payer: Self-pay | Admitting: Internal Medicine

## 2018-12-28 NOTE — Telephone Encounter (Signed)
Control database checked last refill: 10/15/2018 LOV:02/16/2018 IEP:[employer]IR[employer]

## 2018-12-30 ENCOUNTER — Other Ambulatory Visit: Payer: Self-pay | Admitting: Obstetrics & Gynecology

## 2018-12-30 DIAGNOSIS — L821 Other seborrheic keratosis: Secondary | ICD-10-CM | POA: Diagnosis not present

## 2018-12-30 DIAGNOSIS — D229 Melanocytic nevi, unspecified: Secondary | ICD-10-CM | POA: Diagnosis not present

## 2019-01-18 ENCOUNTER — Other Ambulatory Visit: Payer: Self-pay | Admitting: Internal Medicine

## 2019-02-16 DIAGNOSIS — F3181 Bipolar II disorder: Secondary | ICD-10-CM | POA: Diagnosis not present

## 2019-02-16 DIAGNOSIS — F411 Generalized anxiety disorder: Secondary | ICD-10-CM | POA: Diagnosis not present

## 2019-03-21 ENCOUNTER — Other Ambulatory Visit: Payer: Self-pay | Admitting: Internal Medicine

## 2019-03-22 NOTE — Telephone Encounter (Signed)
Can you please schedule patient for visit. Thank you

## 2019-03-22 NOTE — Telephone Encounter (Signed)
Control database checked last refill: 02/11/2019 LOV: 01/08/2018 cpe NOV: none

## 2019-03-22 NOTE — Telephone Encounter (Signed)
Needs visit for refill

## 2019-03-23 NOTE — Telephone Encounter (Signed)
Left message for patient to call back to schedule.  °

## 2019-03-29 ENCOUNTER — Telehealth: Payer: Self-pay | Admitting: Internal Medicine

## 2019-03-29 NOTE — Telephone Encounter (Signed)
Noted  

## 2019-03-29 NOTE — Telephone Encounter (Signed)
Pt has made CPE appt, patient needs TRAMADOL refill, please advise

## 2019-03-29 NOTE — Telephone Encounter (Signed)
Needs visit for refills.

## 2019-04-05 ENCOUNTER — Encounter: Payer: Self-pay | Admitting: Internal Medicine

## 2019-04-06 ENCOUNTER — Other Ambulatory Visit: Payer: Self-pay

## 2019-04-06 ENCOUNTER — Ambulatory Visit (INDEPENDENT_AMBULATORY_CARE_PROVIDER_SITE_OTHER): Payer: Federal, State, Local not specified - PPO | Admitting: Internal Medicine

## 2019-04-06 ENCOUNTER — Other Ambulatory Visit (INDEPENDENT_AMBULATORY_CARE_PROVIDER_SITE_OTHER): Payer: Federal, State, Local not specified - PPO

## 2019-04-06 ENCOUNTER — Encounter: Payer: Self-pay | Admitting: Internal Medicine

## 2019-04-06 VITALS — BP 112/80 | HR 55 | Temp 97.8°F | Ht 66.25 in | Wt 128.0 lb

## 2019-04-06 DIAGNOSIS — M150 Primary generalized (osteo)arthritis: Secondary | ICD-10-CM | POA: Diagnosis not present

## 2019-04-06 DIAGNOSIS — Z0000 Encounter for general adult medical examination without abnormal findings: Secondary | ICD-10-CM

## 2019-04-06 DIAGNOSIS — Z8619 Personal history of other infectious and parasitic diseases: Secondary | ICD-10-CM

## 2019-04-06 DIAGNOSIS — F329 Major depressive disorder, single episode, unspecified: Secondary | ICD-10-CM

## 2019-04-06 DIAGNOSIS — M8949 Other hypertrophic osteoarthropathy, multiple sites: Secondary | ICD-10-CM

## 2019-04-06 DIAGNOSIS — M159 Polyosteoarthritis, unspecified: Secondary | ICD-10-CM

## 2019-04-06 DIAGNOSIS — Z23 Encounter for immunization: Secondary | ICD-10-CM | POA: Diagnosis not present

## 2019-04-06 DIAGNOSIS — F32A Depression, unspecified: Secondary | ICD-10-CM

## 2019-04-06 DIAGNOSIS — J3089 Other allergic rhinitis: Secondary | ICD-10-CM

## 2019-04-06 LAB — LIPID PANEL
Cholesterol: 156 mg/dL (ref 0–200)
HDL: 72.6 mg/dL (ref >39.00)
LDL Cholesterol: 67 mg/dL (ref 0–99)
NonHDL: 83.49
Total CHOL/HDL Ratio: 2
Triglycerides: 83 mg/dL (ref 0.0–149.0)
VLDL: 16.6 mg/dL (ref 0.0–40.0)

## 2019-04-06 LAB — COMPREHENSIVE METABOLIC PANEL
ALT: 13 U/L (ref 0–35)
AST: 18 U/L (ref 0–37)
Albumin: 4.5 g/dL (ref 3.5–5.2)
Alkaline Phosphatase: 69 U/L (ref 39–117)
BUN: 13 mg/dL (ref 6–23)
CO2: 28 mEq/L (ref 19–32)
Calcium: 8.8 mg/dL (ref 8.4–10.5)
Chloride: 105 mEq/L (ref 96–112)
Creatinine, Ser: 0.75 mg/dL (ref 0.40–1.20)
GFR: 77.91 mL/min (ref >60.00)
Glucose, Bld: 81 mg/dL (ref 70–99)
Potassium: 4.1 mEq/L (ref 3.5–5.1)
Sodium: 139 mEq/L (ref 135–145)
Total Bilirubin: 0.4 mg/dL (ref 0.2–1.2)
Total Protein: 7.6 g/dL (ref 6.0–8.3)

## 2019-04-06 LAB — CBC
HCT: 40.1 % (ref 36.0–46.0)
Hemoglobin: 13.3 g/dL (ref 12.0–15.0)
MCHC: 33.2 g/dL (ref 30.0–36.0)
MCV: 97.1 fl (ref 78.0–100.0)
Platelets: 209 10*3/uL (ref 150.0–400.0)
RBC: 4.12 Mil/uL (ref 3.87–5.11)
RDW: 13.7 % (ref 11.5–15.5)
WBC: 5.9 10*3/uL (ref 4.0–10.5)

## 2019-04-06 LAB — HEMOGLOBIN A1C: Hgb A1c MFr Bld: 5.3 % (ref 4.6–6.5)

## 2019-04-06 LAB — VITAMIN B12: Vitamin B-12: 387 pg/mL (ref 211–911)

## 2019-04-06 LAB — TSH: TSH: 2.63 u[IU]/mL (ref 0.35–4.50)

## 2019-04-06 LAB — VITAMIN D 25 HYDROXY (VIT D DEFICIENCY, FRACTURES): VITD: 34.28 ng/mL (ref 30.00–100.00)

## 2019-04-06 MED ORDER — METOPROLOL TARTRATE 50 MG PO TABS: 50.0000 mg | ORAL_TABLET | Freq: Two times a day (BID) | ORAL | 3 refills | Status: DC

## 2019-04-06 MED ORDER — TRAMADOL HCL 50 MG PO TABS: ORAL_TABLET | ORAL | 3 refills | Status: DC

## 2019-04-06 MED ORDER — FLUTICASONE PROPIONATE 50 MCG/ACT NA SUSP: 2.0000 | Freq: Every day | NASAL | 3 refills | Status: DC

## 2019-04-06 MED ORDER — MONTELUKAST SODIUM 10 MG PO TABS: 10.0000 mg | ORAL_TABLET | Freq: Every day | ORAL | 3 refills | Status: DC

## 2019-04-06 NOTE — Progress Notes (Signed)
   Subjective:   Patient ID: Monique [name], female    DOB: 04-04-1956, 63 y.o.   MRN: [id number]  HPI The patient is a 63 YO female coming in for physical.  PMH, [place], social history reviewed and updated  Review of Systems  Constitutional: Negative.   HENT: Negative.   Eyes: Negative.   Respiratory: Negative for cough, chest tightness and shortness of breath.   Cardiovascular: Negative for chest pain, palpitations and leg swelling.  Gastrointestinal: Negative for abdominal distention, abdominal pain, constipation, diarrhea, nausea and vomiting.  Musculoskeletal: Positive for arthralgias and back pain.  Skin: Negative.   Neurological: Negative.   Psychiatric/Behavioral: Negative.     Objective:  Physical Exam Constitutional:      Appearance: She is well-developed.  HENT:     Head: Normocephalic and atraumatic.  Neck:     Musculoskeletal: Normal range of motion.  Cardiovascular:     Rate and Rhythm: Normal rate and regular rhythm.  Pulmonary:     Effort: Pulmonary effort is normal. No respiratory distress.     Breath sounds: Normal breath sounds. No wheezing or rales.  Abdominal:     General: Bowel sounds are normal. There is no distension.     Palpations: Abdomen is soft.     Tenderness: There is no abdominal tenderness. There is no rebound.  Skin:    General: Skin is warm and dry.  Neurological:     Mental Status: She is alert and oriented to person, place, and time.     Coordination: Coordination normal.     Vitals:   04/06/19 1037  BP: 112/80  Pulse: (!) 55  Temp: 97.8 F (36.6 C)  TempSrc: Oral  SpO2: 98%  Weight: 128 lb (58.1 kg)  Height: 5' 6.25" (1.683 m)    Assessment & Plan:  Shingrix IM given at visit

## 2019-04-06 NOTE — Assessment & Plan Note (Signed)
Seeing psych and anxiety worse with pandemic.

## 2019-04-06 NOTE — Assessment & Plan Note (Signed)
Takes tramadol #15 every 2 months or so. [place] narcotic database reviewed and appropriate.

## 2019-04-06 NOTE — Assessment & Plan Note (Signed)
Flu shot counseled. Shingrix given 1st today. Tetanus up to date. Colonoscopy needs done, counseled. Mammogram needs done counseled, pap smear up to date and dexa up to date. Counseled about sun safety and mole surveillance. Counseled about the dangers of distracted driving. Given 10 year screening recommendations.

## 2019-04-06 NOTE — Assessment & Plan Note (Signed)
Refill flonase and singulair.

## 2019-04-06 NOTE — Assessment & Plan Note (Signed)
Monitor LFTs yearly.

## 2019-04-06 NOTE — Patient Instructions (Signed)
Health Maintenance, Female Adopting a healthy lifestyle and getting preventive care are important in promoting health and wellness. Ask your health care provider about:  The right schedule for you to have regular tests and exams.  Things you can do on your own to prevent diseases and keep yourself healthy. What should I know about diet, weight, and exercise? Eat a healthy diet   Eat a diet that includes plenty of vegetables, fruits, low-fat dairy products, and lean protein.  Do not eat a lot of foods that are high in solid fats, added sugars, or sodium. Maintain a healthy weight Body mass index (BMI) is used to identify weight problems. It estimates body fat based on height and weight. Your health care provider can help determine your BMI and help you achieve or maintain a healthy weight. Get regular exercise Get regular exercise. This is one of the most important things you can do for your health. Most adults should:  Exercise for at least 150 minutes each week. The exercise should increase your heart rate and make you sweat (moderate-intensity exercise).  Do strengthening exercises at least twice a week. This is in addition to the moderate-intensity exercise.  Spend less time sitting. Even light physical activity can be beneficial. Watch cholesterol and blood lipids Have your blood tested for lipids and cholesterol at 64 years of age, then have this test every 5 years. Have your cholesterol levels checked more often if:  Your lipid or cholesterol levels are high.  You are older than 64 years of age.  You are at high risk for heart disease. What should I know about cancer screening? Depending on your health history and family history, you may need to have cancer screening at various ages. This may include screening for:  Breast cancer.  Cervical cancer.  Colorectal cancer.  Skin cancer.  Lung cancer. What should I know about heart disease, diabetes, and high blood  pressure? Blood pressure and heart disease  High blood pressure causes heart disease and increases the risk of stroke. This is more likely to develop in people who have high blood pressure readings, are of African descent, or are overweight.  Have your blood pressure checked: ? Every 3-5 years if you are 18-39 years of age. ? Every year if you are 40 years old or older. Diabetes Have regular diabetes screenings. This checks your fasting blood sugar level. Have the screening done:  Once every three years after age 40 if you are at a normal weight and have a low risk for diabetes.  More often and at a younger age if you are overweight or have a high risk for diabetes. What should I know about preventing infection? Hepatitis B If you have a higher risk for hepatitis B, you should be screened for this virus. Talk with your health care provider to find out if you are at risk for hepatitis B infection. Hepatitis C Testing is recommended for:  Everyone born from 1945 through 1965.  Anyone with known risk factors for hepatitis C. Sexually transmitted infections (STIs)  Get screened for STIs, including gonorrhea and chlamydia, if: ? You are sexually active and are younger than 64 years of age. ? You are older than 64 years of age and your health care provider tells you that you are at risk for this type of infection. ? Your sexual activity has changed since you were last screened, and you are at increased risk for chlamydia or gonorrhea. Ask your health care provider if   you are at risk.  Ask your health care provider about whether you are at high risk for HIV. Your health care provider may recommend a prescription medicine to help prevent HIV infection. If you choose to take medicine to prevent HIV, you should first get tested for HIV. You should then be tested every 3 months for as long as you are taking the medicine. Pregnancy  If you are about to stop having your period (premenopausal) and  you may become pregnant, seek counseling before you get pregnant.  Take 400 to 800 micrograms (mcg) of folic acid every day if you become pregnant.  Ask for birth control (contraception) if you want to prevent pregnancy. Osteoporosis and menopause Osteoporosis is a disease in which the bones lose minerals and strength with aging. This can result in bone fractures. If you are 65 years old or older, or if you are at risk for osteoporosis and fractures, ask your health care provider if you should:  Be screened for bone loss.  Take a calcium or vitamin D supplement to lower your risk of fractures.  Be given hormone replacement therapy (HRT) to treat symptoms of menopause. Follow these instructions at home: Lifestyle  Do not use any products that contain nicotine or tobacco, such as cigarettes, e-cigarettes, and chewing tobacco. If you need help quitting, ask your health care provider.  Do not use street drugs.  Do not share needles.  Ask your health care provider for help if you need support or information about quitting drugs. Alcohol use  Do not drink alcohol if: ? Your health care provider tells you not to drink. ? You are pregnant, may be pregnant, or are planning to become pregnant.  If you drink alcohol: ? Limit how much you use to 0-1 drink a day. ? Limit intake if you are breastfeeding.  Be aware of how much alcohol is in your drink. In the [place] one drink equals one 12 oz bottle of beer (355 mL), one 5 oz glass of wine (148 mL), or one 1 oz glass of hard liquor (44 mL). General instructions  Schedule regular health, dental, and eye exams.  Stay current with your vaccines.  Tell your health care provider if: ? You often feel depressed. ? You have ever been abused or do not feel safe at home. Summary  Adopting a healthy lifestyle and getting preventive care are important in promoting health and wellness.  Follow your health care provider's instructions about healthy  diet, exercising, and getting tested or screened for diseases.  Follow your health care provider's instructions on monitoring your cholesterol and blood pressure. This information is not intended to replace advice given to you by your health care provider. Make sure you discuss any questions you have with your health care provider. Document Released: 04/01/2011 Document Revised: 09/09/2018 Document Reviewed: 09/09/2018 Elsevier Patient Education  2020 Elsevier Inc.  

## 2019-05-17 ENCOUNTER — Encounter: Payer: Self-pay | Admitting: Internal Medicine

## 2019-05-18 ENCOUNTER — Ambulatory Visit (INDEPENDENT_AMBULATORY_CARE_PROVIDER_SITE_OTHER): Payer: Federal, State, Local not specified - PPO | Admitting: Family

## 2019-05-18 ENCOUNTER — Encounter: Payer: Self-pay | Admitting: Family

## 2019-05-18 DIAGNOSIS — J3089 Other allergic rhinitis: Secondary | ICD-10-CM

## 2019-05-18 DIAGNOSIS — J019 Acute sinusitis, unspecified: Secondary | ICD-10-CM | POA: Diagnosis not present

## 2019-05-18 MED ORDER — CEFDINIR 300 MG PO CAPS: 300.0000 mg | ORAL_CAPSULE | Freq: Two times a day (BID) | ORAL | 0 refills | Status: DC

## 2019-05-18 MED ORDER — PREDNISONE 20 MG PO TABS: 20.0000 mg | ORAL_TABLET | Freq: Every day | ORAL | 0 refills | Status: DC

## 2019-05-18 NOTE — Progress Notes (Signed)
Monique [name] is a 63 y.o. female with the following history as recorded in [facility]:  Patient Active Problem List   Diagnosis Date Noted  . Routine general medical examination at a health care facility 07/30/2014  . DJD (degenerative joint disease) 07/13/2012  . Depression 01/01/2012  . HSV 06/19/2007  . History of hepatitis C 06/19/2007  . Mitral valve disorder 06/19/2007  . Allergic rhinitis 06/19/2007    Current Outpatient Medications  Medication Sig Dispense Refill  . acyclovir ointment (ZOVIRAX) 5 % Apply 1 application topically every 3 (three) hours. 15 g 0  . alendronate (FOSAMAX) 70 MG tablet Take 1 tablet (70 mg total) by mouth once a week. Take with a full glass of water on an empty stomach. 12 tablet 4  . Calcium Carbonate-Vit D-Min (CALCIUM 1200 PO) Take by mouth. Take 1 tab once daily.    . cefdinir (OMNICEF) 300 MG capsule Take 1 capsule (300 mg total) by mouth 2 (two) times daily. 20 capsule 0  . cetirizine (ZYRTEC) 10 MG tablet Take 10 mg by mouth daily.    . Cholecalciferol (VITAMIN D3) 1000 UNITS CHEW Chew 1 tablet by mouth 2 (two) times daily.    . fluticasone (FLONASE) 50 MCG/ACT nasal spray Place 2 sprays into both nostrils daily. 48 g 3  . lamoTRIgine (LAMICTAL) 100 MG tablet Take 100 mg by mouth daily. Take 1/2 tablet for 50 mg daily.    Marland Kitchen LORazepam (ATIVAN) 1 MG tablet Take 1-2 mg by mouth at bedtime. And as needed during the day    . metoprolol tartrate (LOPRESSOR) 50 MG tablet Take 1 tablet (50 mg total) by mouth 2 (two) times daily. 180 tablet 3  . montelukast (SINGULAIR) 10 MG tablet Take 1 tablet (10 mg total) by mouth at bedtime. 90 tablet 3  . Omega-3 Fatty Acids (FISH OIL) 1000 MG CAPS Take by mouth. Take 1 cap by mouth daily.    . predniSONE (DELTASONE) 20 MG tablet Take 1 tablet (20 mg total) by mouth daily with breakfast. 5 tablet 0  . QUEtiapine Fumarate (SEROQUEL PO) Take 50 mg by mouth. Take 1 tab at bedtime daily.    . sertraline (ZOLOFT) 50  MG tablet Take 50 mg by mouth daily. Take 1 tabv by mouth every morning.    . sodium chloride (OCEAN) 0.65 % SOLN nasal spray Place 1 spray into both nostrils as needed for congestion.    . traMADol (ULTRAM) 50 MG tablet TAKE (1) TABLET DAILY AS NEEDED. 15 tablet 3  . valACYclovir (VALTREX) 1000 MG tablet TAKE 1 TABLET(1000 MG) BY MOUTH DAILY 90 tablet 1   No current facility-administered medications for this visit.     Allergies: Patient has no known allergies.  Past Medical History:  Diagnosis Date  . Abnormal electrocardiogram   . Allergic rhinitis   . Anxiety   . Bipolar disorder ([place])   . Depression    Some  . History of hepatitis C    Hep B also  . History of skin cancer   . HSV infection   . Memory loss   . MVP (mitral valve prolapse)   . Paresthesia   . Unspecified chronic bronchitis ([place])     Past Surgical History:  Procedure Laterality Date  . APPENDECTOMY  11/08  . MOHS SURGERY    . RHINOPLASTY    . TUBAL LIGATION     bilateral    Family History  Problem Relation Age of Onset  . COPD Mother   .  Allergies Mother   . Heart disease Mother   . Atrial fibrillation Mother   . Heart failure Mother   . Colon cancer Mother 68       Had 6 inches, resected.  Marland Kitchen COPD Paternal Uncle   . Allergies Father   . Rheum arthritis Father   . Bladder Cancer Father   . Cancer Father        bladder/prostate  . Asthma Other     Social History   Tobacco Use  . Smoking status: Former Smoker    Packs/day: 1.50    Years: 25.00    Pack years: 37.50    Types: Cigarettes    Quit date: 09/30/1996    Years since quitting: 22.6  . Smokeless tobacco: Never Used  Substance Use Topics  . Alcohol use: Yes    Comment: rare    Subjective:    I connected with Monique [name] on 05/18/19 at 11:40 AM EDT by a video enabled telemedicine application and verified that I am speaking with the correct person using two identifiers. Patient and I are the only 2 people on the video call.     I discussed the limitations of evaluation and management by telemedicine and the availability of in person appointments. The patient expressed understanding and agreed to proceed.  Patient presents with concerns for possible sinus infection; is prone to recurrent sinus infections; patient feels that her allergies have caused the infection. + sinus pain/ pressure; left sided cheek is swollen; no shortness of breath or cough; has had some vertigo over the past few days-  Room has felt like it has been spinning.    Objective:  There were no vitals filed for this visit.  General: Well developed, well nourished, in no acute distress  Head: Normocephalic and atraumatic  Lungs: Respirations unlabored;  Neurologic: Alert and oriented; speech intact; face symmetrical;   Assessment:  1. Acute sinusitis, recurrence not specified, unspecified location   2. Non-seasonal allergic rhinitis due to other allergic trigger     Plan:  Low suspicion for COVID; Rx for Omnicef and Prednionse; she understands to quarantine while on the prednisone; increase fluids, rest and follow-up worse, no better.   No follow-ups on file.  No orders of the defined types were placed in this encounter.   Requested Prescriptions   Signed Prescriptions Disp Refills  . cefdinir (OMNICEF) 300 MG capsule 20 capsule 0    Sig: Take 1 capsule (300 mg total) by mouth 2 (two) times daily.  . predniSONE (DELTASONE) 20 MG tablet 5 tablet 0    Sig: Take 1 tablet (20 mg total) by mouth daily with breakfast.

## 2019-05-26 DIAGNOSIS — Z20828 Contact with and (suspected) exposure to other viral communicable diseases: Secondary | ICD-10-CM | POA: Diagnosis not present

## 2019-06-08 ENCOUNTER — Other Ambulatory Visit: Payer: Self-pay | Admitting: Internal Medicine

## 2019-06-28 ENCOUNTER — Other Ambulatory Visit: Payer: Self-pay | Admitting: Obstetrics & Gynecology

## 2019-07-07 ENCOUNTER — Ambulatory Visit: Payer: Federal, State, Local not specified - PPO

## 2019-07-28 DIAGNOSIS — F411 Generalized anxiety disorder: Secondary | ICD-10-CM | POA: Diagnosis not present

## 2019-07-28 DIAGNOSIS — F331 Major depressive disorder, recurrent, moderate: Secondary | ICD-10-CM | POA: Diagnosis not present

## 2019-08-05 DIAGNOSIS — H25813 Combined forms of age-related cataract, bilateral: Secondary | ICD-10-CM | POA: Diagnosis not present

## 2019-08-05 DIAGNOSIS — H43813 Vitreous degeneration, bilateral: Secondary | ICD-10-CM | POA: Diagnosis not present

## 2019-08-05 DIAGNOSIS — H40013 Open angle with borderline findings, low risk, bilateral: Secondary | ICD-10-CM | POA: Diagnosis not present

## 2019-08-05 DIAGNOSIS — H04123 Dry eye syndrome of bilateral lacrimal glands: Secondary | ICD-10-CM | POA: Diagnosis not present

## 2019-09-14 DIAGNOSIS — F331 Major depressive disorder, recurrent, moderate: Secondary | ICD-10-CM | POA: Diagnosis not present

## 2019-09-14 DIAGNOSIS — F411 Generalized anxiety disorder: Secondary | ICD-10-CM | POA: Diagnosis not present

## 2019-09-20 ENCOUNTER — Other Ambulatory Visit: Payer: Self-pay | Admitting: Internal Medicine

## 2019-09-21 NOTE — Telephone Encounter (Signed)
Filled 08/13/2019 Tramadol Hcl 50 Mg Tablet 15#  Last OV - 04/06/19 Next OV - not scheduled

## 2019-09-26 ENCOUNTER — Other Ambulatory Visit: Payer: Self-pay | Admitting: Obstetrics & Gynecology

## 2019-11-16 DIAGNOSIS — F411 Generalized anxiety disorder: Secondary | ICD-10-CM | POA: Diagnosis not present

## 2019-11-16 DIAGNOSIS — F331 Major depressive disorder, recurrent, moderate: Secondary | ICD-10-CM | POA: Diagnosis not present

## 2019-11-22 ENCOUNTER — Other Ambulatory Visit: Payer: Federal, State, Local not specified - PPO

## 2019-11-23 ENCOUNTER — Other Ambulatory Visit: Payer: Federal, State, Local not specified - PPO

## 2019-11-24 DIAGNOSIS — F411 Generalized anxiety disorder: Secondary | ICD-10-CM | POA: Diagnosis not present

## 2019-11-24 DIAGNOSIS — F3181 Bipolar II disorder: Secondary | ICD-10-CM | POA: Diagnosis not present

## 2019-11-26 ENCOUNTER — Ambulatory Visit: Payer: Federal, State, Local not specified - PPO | Attending: Internal Medicine

## 2019-11-26 DIAGNOSIS — Z20822 Contact with and (suspected) exposure to covid-19: Secondary | ICD-10-CM

## 2019-11-27 LAB — NOVEL CORONAVIRUS, NAA: SARS-CoV-2, NAA: NOT DETECTED

## 2019-11-28 ENCOUNTER — Other Ambulatory Visit: Payer: Self-pay

## 2019-11-28 ENCOUNTER — Ambulatory Visit: Payer: Federal, State, Local not specified - PPO | Attending: Internal Medicine

## 2019-11-28 DIAGNOSIS — Z23 Encounter for immunization: Secondary | ICD-10-CM

## 2019-11-28 NOTE — Progress Notes (Signed)
   Covid-19 Vaccination Clinic  Name:  Monique [name]    MRN: [id number] DOB: 07/20/1956  11/28/2019  Monique [name] was observed post Covid-19 immunization for 15 minutes without incidence. She was provided with Vaccine Information Sheet and instruction to access the V-Safe system.   Monique [name] was instructed to call 911 with any severe reactions post vaccine: Marland Kitchen Difficulty breathing  . Swelling of your face and throat  . A fast heartbeat  . A bad rash all over your body  . Dizziness and weakness    Immunizations Administered    Name Date Dose VIS Date Route   Pfizer COVID-19 Vaccine 11/28/2019  9:27 AM 0.3 mL 09/10/2019 Intramuscular   Manufacturer: [place]   Lot: [id number]   [place]: [id number]

## 2019-12-22 ENCOUNTER — Ambulatory Visit: Payer: Federal, State, Local not specified - PPO | Attending: Internal Medicine

## 2019-12-22 DIAGNOSIS — Z23 Encounter for immunization: Secondary | ICD-10-CM

## 2019-12-22 DIAGNOSIS — F3181 Bipolar II disorder: Secondary | ICD-10-CM | POA: Diagnosis not present

## 2019-12-22 DIAGNOSIS — F411 Generalized anxiety disorder: Secondary | ICD-10-CM | POA: Diagnosis not present

## 2019-12-22 NOTE — Progress Notes (Signed)
   Covid-19 Vaccination Clinic  Name:  Monique [name]    MRN: [id number] DOB: [birth year]  12/22/2019  [name] was observed post Covid-19 immunization for 15 minutes without incident. She was provided with Vaccine Information Sheet and instruction to access the V-Safe system.   [name] was instructed to call 911 with any severe reactions post vaccine: Marland Kitchen Difficulty breathing  . Swelling of face and throat  . A fast heartbeat  . A bad rash all over body  . Dizziness and weakness   Immunizations Administered    Name Date Dose VIS Date Route   Pfizer COVID-19 Vaccine 12/22/2019 11:55 AM 0.3 mL 09/10/2019 Intramuscular   Manufacturer: [place]   Lot: [id number]   [place]: [id number]

## 2019-12-31 ENCOUNTER — Other Ambulatory Visit: Payer: Self-pay | Admitting: Obstetrics & Gynecology

## 2020-01-03 DIAGNOSIS — F3181 Bipolar II disorder: Secondary | ICD-10-CM | POA: Diagnosis not present

## 2020-01-10 ENCOUNTER — Telehealth: Payer: Self-pay | Admitting: *Deleted

## 2020-01-10 DIAGNOSIS — F3181 Bipolar II disorder: Secondary | ICD-10-CM | POA: Diagnosis not present

## 2020-01-10 MED ORDER — VALACYCLOVIR HCL 1 G PO TABS: 1000.0000 mg | ORAL_TABLET | Freq: Every day | ORAL | 1 refills | Status: DC

## 2020-01-10 NOTE — Telephone Encounter (Signed)
Patient called requesting refill on Valtrex 1,000 mg tablet, overdue for annual exam, last seen on 08/2018. Will route to appointments to schedule then Rx can be sent.

## 2020-01-10 NOTE — Telephone Encounter (Signed)
Patient scheduled for her wellness exam on May 19.

## 2020-01-10 NOTE — Telephone Encounter (Signed)
Rx sent 

## 2020-01-17 DIAGNOSIS — F3181 Bipolar II disorder: Secondary | ICD-10-CM | POA: Diagnosis not present

## 2020-01-20 DIAGNOSIS — F3181 Bipolar II disorder: Secondary | ICD-10-CM | POA: Diagnosis not present

## 2020-01-20 DIAGNOSIS — F411 Generalized anxiety disorder: Secondary | ICD-10-CM | POA: Diagnosis not present

## 2020-01-24 DIAGNOSIS — F3181 Bipolar II disorder: Secondary | ICD-10-CM | POA: Diagnosis not present

## 2020-01-31 DIAGNOSIS — F3181 Bipolar II disorder: Secondary | ICD-10-CM | POA: Diagnosis not present

## 2020-02-08 ENCOUNTER — Other Ambulatory Visit: Payer: Self-pay | Admitting: Obstetrics & Gynecology

## 2020-02-08 NOTE — Telephone Encounter (Signed)
Scheduled for CE 02/16/20.

## 2020-02-14 DIAGNOSIS — F3181 Bipolar II disorder: Secondary | ICD-10-CM | POA: Diagnosis not present

## 2020-02-15 ENCOUNTER — Other Ambulatory Visit: Payer: Self-pay

## 2020-02-16 ENCOUNTER — Ambulatory Visit (INDEPENDENT_AMBULATORY_CARE_PROVIDER_SITE_OTHER): Payer: Federal, State, Local not specified - PPO | Admitting: Obstetrics & Gynecology

## 2020-02-16 ENCOUNTER — Encounter: Payer: Self-pay | Admitting: Obstetrics & Gynecology

## 2020-02-16 VITALS — BP 126/78 | Ht 66.25 in | Wt 128.0 lb

## 2020-02-16 DIAGNOSIS — Z01419 Encounter for gynecological examination (general) (routine) without abnormal findings: Secondary | ICD-10-CM

## 2020-02-16 DIAGNOSIS — Z780 Asymptomatic menopausal state: Secondary | ICD-10-CM

## 2020-02-16 DIAGNOSIS — M810 Age-related osteoporosis without current pathological fracture: Secondary | ICD-10-CM | POA: Diagnosis not present

## 2020-02-16 DIAGNOSIS — Z8619 Personal history of other infectious and parasitic diseases: Secondary | ICD-10-CM

## 2020-02-16 MED ORDER — VALACYCLOVIR HCL 1 G PO TABS: 500.0000 mg | ORAL_TABLET | Freq: Every day | ORAL | 4 refills | Status: DC

## 2020-02-16 MED ORDER — ACYCLOVIR 5 % EX OINT: 1.0000 "application " | TOPICAL_OINTMENT | CUTANEOUS | 4 refills | Status: DC

## 2020-02-16 NOTE — Progress Notes (Signed)
Monique [name] 04/09/1956 [id number]   History:    64 y.o. G0 Married  RP:  Established patient presenting for annual gyn exam   HPI: Menopause, well on no HRT.  No PMB.  No pelvic pain.  Abstinent.  Urine and bowel movements normal.  Breast normal.  Needs to schedule a Mammo.  Osteoporosis on Fosamax.  Rare genital HSV recurrences on Valacyclovir prophylaxis.  Body mass index 20.5.  In good fitness.  Health labs with family physician.  Colono 2014, needs a repeat.  Mother with Colon Ca.   Past medical history,surgical history, family history and social history were all reviewed and documented in the EPIC chart.  Gynecologic History No LMP recorded. Patient is postmenopausal.  Obstetric History OB History  Gravida Para Term Preterm AB Living  0 0 0 0 0 0  SAB TAB Ectopic Multiple Live Births  0 0 0 0 0     ROS: A ROS was performed and pertinent positives and negatives are included in the history.  GENERAL: No fevers or chills. HEENT: No change in vision, no earache, sore throat or sinus congestion. NECK: No pain or stiffness. CARDIOVASCULAR: No chest pain or pressure. No palpitations. PULMONARY: No shortness of breath, cough or wheeze. GASTROINTESTINAL: No abdominal pain, nausea, vomiting or diarrhea, melena or bright red blood per rectum. GENITOURINARY: No urinary frequency, urgency, hesitancy or dysuria. MUSCULOSKELETAL: No joint or muscle pain, no back pain, no recent trauma. DERMATOLOGIC: No rash, no itching, no lesions. ENDOCRINE: No polyuria, polydipsia, no heat or cold intolerance. No recent change in weight. HEMATOLOGICAL: No anemia or easy bruising or bleeding. NEUROLOGIC: No headache, seizures, numbness, tingling or weakness. PSYCHIATRIC: No depression, no loss of interest in normal activity or change in sleep pattern.     Exam:   BP 126/78   Ht 5' 6.25" (1.683 m)   Wt 128 lb (58.1 kg)   BMI 20.50 kg/m   Body mass index is 20.5 kg/m.  General appearance :  Well developed well nourished female. No acute distress HEENT: Eyes: no retinal hemorrhage or exudates,  Neck supple, trachea midline, no carotid bruits, no thyroidmegaly Lungs: Clear to auscultation, no rhonchi or wheezes, or rib retractions  Heart: Regular rate and rhythm, no murmurs or gallops Breast:Examined in sitting and supine position were symmetrical in appearance, no palpable masses or tenderness,  no skin retraction, no nipple inversion, no nipple discharge, no skin discoloration, no axillary or supraclavicular lymphadenopathy Abdomen: no palpable masses or tenderness, no rebound or guarding Extremities: no edema or skin discoloration or tenderness  Pelvic: Vulva: Normal             Vagina: No gross lesions or discharge  Cervix: No gross lesions or discharge  Uterus  AV, normal size, shape and consistency, non-tender and mobile  Adnexa  Without masses or tenderness  Anus: Normal    Assessment/Plan:  64 y.o. female for annual exam   1. Well female exam with routine gynecological exam Normal gynecologic exam in menopause.  Pap test December 2019 was negative, will repeat at 3 years.  Breast exam normal.  Patient will schedule a screening mammogram at the breast center.  Time to schedule a colonoscopy as her last colonoscopy was in 2014 and her mother had colon cancer.  Health labs with family physician.  Good body mass index at 20.5.  Continue with fitness and healthy nutrition.  2. Postmenopausal Well on no hormone replacement therapy.  No postmenopausal bleeding.  3. H/O  herpes genitalis Rare recurrences of genital herpes.  Will continue on valacyclovir prophylaxis at 500 mg per mouth daily.  Prescription sent to pharmacy.  Prescription for Zovirax ointment sent as well.  4. Age-related osteoporosis without current pathological fracture Osteoporosis per last bone density February 2020.  Well on Fosamax.  We will continue on Fosamax at this time.  Continue with vitamin D  supplements and calcium intake of 1200 to 1500 mg daily.  Continue with regular weightbearing physical activities.  Repeat a bone density in February 2022. - DG Bone Density; Future  Other orders - acyclovir ointment (ZOVIRAX) 5 %; Apply 1 application topically every 3 (three) hours. For recurrences of HSV. - valACYclovir (VALTREX) 1000 MG tablet; Take 0.5 tablets (500 mg total) by mouth daily.  Princess Bruins MD, 3:52 PM 02/16/2020

## 2020-02-16 NOTE — Patient Instructions (Signed)
1. Well female exam with routine gynecological exam Normal gynecologic exam in menopause.  Pap test December 2019 was negative, will repeat at 3 years.  Breast exam normal.  Patient will schedule a screening mammogram at the breast center.  Time to schedule a colonoscopy as her last colonoscopy was in 2014 and her mother had colon cancer.  Health labs with family physician.  Good body mass index at 20.5.  Continue with fitness and healthy nutrition.  2. Postmenopausal Well on no hormone replacement therapy.  No postmenopausal bleeding.  3. H/O herpes genitalis Rare recurrences of genital herpes.  Will continue on valacyclovir prophylaxis at 500 mg per mouth daily.  Prescription sent to pharmacy.  Prescription for Zovirax ointment sent as well.  4. Age-related osteoporosis without current pathological fracture Osteoporosis per last bone density February 2020.  Well on Fosamax.  We will continue on Fosamax at this time.  Continue with vitamin D supplements and calcium intake of 1200 to 1500 mg daily.  Continue with regular weightbearing physical activities.  Repeat a bone density in February 2022. - DG Bone Density; Future  Other orders - acyclovir ointment (ZOVIRAX) 5 %; Apply 1 application topically every 3 (three) hours. For recurrences of HSV. - valACYclovir (VALTREX) 1000 MG tablet; Take 0.5 tablets (500 mg total) by mouth daily.  Monique [name], it was a pleasure seeing you today!

## 2020-03-07 ENCOUNTER — Other Ambulatory Visit: Payer: Self-pay | Admitting: Internal Medicine

## 2020-03-07 NOTE — Telephone Encounter (Signed)
01/24/2020 Tramadol Hcl 50 Mg Tablet 15#  Last OV 05/18/19 Valere Dross 04/06/19 [place] Next OV n/s

## 2020-04-17 ENCOUNTER — Other Ambulatory Visit: Payer: Self-pay | Admitting: Internal Medicine

## 2020-04-18 NOTE — Telephone Encounter (Signed)
03/07/2020 Tramadol Hcl 50 Mg Tablet 15#  Last ov 05/18/19 Mickel Baas 04/06/19 [place] Next ov n/s  Med denied. Pt need OV.

## 2020-04-19 ENCOUNTER — Other Ambulatory Visit: Payer: Self-pay | Admitting: Obstetrics & Gynecology

## 2020-04-21 ENCOUNTER — Encounter: Payer: Self-pay | Admitting: Internal Medicine

## 2020-04-24 ENCOUNTER — Other Ambulatory Visit: Payer: Self-pay | Admitting: Internal Medicine

## 2020-04-25 NOTE — Telephone Encounter (Signed)
03/07/2020 Tramadol Hcl 50 Mg Tablet 15#  Last ov 05/18/19 Next ov 04/27/20

## 2020-04-27 ENCOUNTER — Other Ambulatory Visit: Payer: Self-pay

## 2020-04-27 ENCOUNTER — Encounter: Payer: Self-pay | Admitting: Internal Medicine

## 2020-04-27 ENCOUNTER — Ambulatory Visit (INDEPENDENT_AMBULATORY_CARE_PROVIDER_SITE_OTHER): Payer: Federal, State, Local not specified - PPO | Admitting: Internal Medicine

## 2020-04-27 VITALS — BP 124/78 | HR 64 | Temp 97.9°F | Ht 66.25 in | Wt 128.0 lb

## 2020-04-27 DIAGNOSIS — F32A Depression, unspecified: Secondary | ICD-10-CM

## 2020-04-27 DIAGNOSIS — M159 Polyosteoarthritis, unspecified: Secondary | ICD-10-CM

## 2020-04-27 DIAGNOSIS — M8949 Other hypertrophic osteoarthropathy, multiple sites: Secondary | ICD-10-CM | POA: Diagnosis not present

## 2020-04-27 DIAGNOSIS — Z0000 Encounter for general adult medical examination without abnormal findings: Secondary | ICD-10-CM | POA: Diagnosis not present

## 2020-04-27 DIAGNOSIS — F329 Major depressive disorder, single episode, unspecified: Secondary | ICD-10-CM

## 2020-04-27 DIAGNOSIS — J3089 Other allergic rhinitis: Secondary | ICD-10-CM

## 2020-04-27 LAB — COMPREHENSIVE METABOLIC PANEL
AG Ratio: 1.6 (calc) (ref 1.0–2.5)
ALT: 10 U/L (ref 6–29)
AST: 16 U/L (ref 10–35)
Albumin: 4.5 g/dL (ref 3.6–5.1)
Alkaline phosphatase (APISO): 61 U/L (ref 37–153)
BUN: 18 mg/dL (ref 7–25)
CO2: 29 mmol/L (ref 20–32)
Calcium: 9.5 mg/dL (ref 8.6–10.4)
Chloride: 103 mmol/L (ref 98–110)
Creat: 0.76 mg/dL (ref 0.50–0.99)
Globulin: 2.9 g/dL (calc) (ref 1.9–3.7)
Glucose, Bld: 74 mg/dL (ref 65–99)
Potassium: 5.3 mmol/L (ref 3.5–5.3)
Sodium: 139 mmol/L (ref 135–146)
Total Bilirubin: 0.4 mg/dL (ref 0.2–1.2)
Total Protein: 7.4 g/dL (ref 6.1–8.1)

## 2020-04-27 LAB — LIPID PANEL
Cholesterol: 169 mg/dL (ref <200)
HDL: 78 mg/dL (ref >=50)
LDL Cholesterol (Calc): 71 mg/dL (calc)
Non-HDL Cholesterol (Calc): 91 mg/dL (calc) (ref <130)
Total CHOL/HDL Ratio: 2.2 (calc) (ref <5.0)
Triglycerides: 123 mg/dL (ref <150)

## 2020-04-27 LAB — CBC
HCT: 39.4 % (ref 35.0–45.0)
Hemoglobin: 13.1 g/dL (ref 11.7–15.5)
MCH: 31.6 pg (ref 27.0–33.0)
MCHC: 33.2 g/dL (ref 32.0–36.0)
MCV: 95.2 fL (ref 80.0–100.0)
MPV: 10.1 fL (ref 7.5–12.5)
Platelets: 222 10*3/uL (ref 140–400)
RBC: 4.14 10*6/uL (ref 3.80–5.10)
RDW: 12.8 % (ref 11.0–15.0)
WBC: 6.6 10*3/uL (ref 3.8–10.8)

## 2020-04-27 MED ORDER — MONTELUKAST SODIUM 10 MG PO TABS: 10.0000 mg | ORAL_TABLET | Freq: Every day | ORAL | 3 refills | Status: DC

## 2020-04-27 MED ORDER — FLUTICASONE PROPIONATE 50 MCG/ACT NA SUSP: 2.0000 | Freq: Every day | NASAL | 3 refills | Status: DC

## 2020-04-27 MED ORDER — METOPROLOL TARTRATE 50 MG PO TABS: 50.0000 mg | ORAL_TABLET | Freq: Two times a day (BID) | ORAL | 3 refills | Status: DC

## 2020-04-27 MED ORDER — TRAMADOL HCL 50 MG PO TABS: ORAL_TABLET | ORAL | 5 refills | Status: DC

## 2020-04-27 NOTE — Patient Instructions (Addendum)
We have sent in all the refills. We will get you back in with the GI doctor for the colonoscopy. We will get you in with the allergy/asthma doctor.   Health Maintenance, Female Adopting a healthy lifestyle and getting preventive care are important in promoting health and wellness. Ask your health care provider about:  The right schedule for you to have regular tests and exams.  Things you can do on your own to prevent diseases and keep yourself healthy. What should I know about diet, weight, and exercise? Eat a healthy diet   Eat a diet that includes plenty of vegetables, fruits, low-fat dairy products, and lean protein.  Do not eat a lot of foods that are high in solid fats, added sugars, or sodium. Maintain a healthy weight Body mass index (BMI) is used to identify weight problems. It estimates body fat based on height and weight. Your health care provider can help determine your BMI and help you achieve or maintain a healthy weight. Get regular exercise Get regular exercise. This is one of the most important things you can do for your health. Most adults should:  Exercise for at least 150 minutes each week. The exercise should increase your heart rate and make you sweat (moderate-intensity exercise).  Do strengthening exercises at least twice a week. This is in addition to the moderate-intensity exercise.  Spend less time sitting. Even light physical activity can be beneficial. Watch cholesterol and blood lipids Have your blood tested for lipids and cholesterol at 65 years of age, then have this test every 5 years. Have your cholesterol levels checked more often if:  Your lipid or cholesterol levels are high.  You are older than 65 years of age.  You are at high risk for heart disease. What should I know about cancer screening? Depending on your health history and family history, you may need to have cancer screening at various ages. This may include screening for:  Breast  cancer.  Cervical cancer.  Colorectal cancer.  Skin cancer.  Lung cancer. What should I know about heart disease, diabetes, and high blood pressure? Blood pressure and heart disease  High blood pressure causes heart disease and increases the risk of stroke. This is more likely to develop in people who have high blood pressure readings, are of African descent, or are overweight.  Have your blood pressure checked: ? Every 3-5 years if you are 32-17 years of age. ? Every year if you are 18 years old or older. Diabetes Have regular diabetes screenings. This checks your fasting blood sugar level. Have the screening done:  Once every three years after age 86 if you are at a normal weight and have a low risk for diabetes.  More often and at a younger age if you are overweight or have a high risk for diabetes. What should I know about preventing infection? Hepatitis B If you have a higher risk for hepatitis B, you should be screened for this virus. Talk with your health care provider to find out if you are at risk for hepatitis B infection. Hepatitis C Testing is recommended for:  Everyone born from [birth year] through 1966.  Anyone with known risk factors for hepatitis C. Sexually transmitted infections (STIs)  Get screened for STIs, including gonorrhea and chlamydia, if: ? You are sexually active and are younger than 65 years of age. ? You are older than 65 years of age and your health care provider tells you that you are at risk for  this type of infection. ? Your sexual activity has changed since you were last screened, and you are at increased risk for chlamydia or gonorrhea. Ask your health care provider if you are at risk.  Ask your health care provider about whether you are at high risk for HIV. Your health care provider may recommend a prescription medicine to help prevent HIV infection. If you choose to take medicine to prevent HIV, you should first get tested for HIV. You should  then be tested every 3 months for as long as you are taking the medicine. Pregnancy  If you are about to stop having your period (premenopausal) and you may become pregnant, seek counseling before you get pregnant.  Take 400 to 800 micrograms (mcg) of folic acid every day if you become pregnant.  Ask for birth control (contraception) if you want to prevent pregnancy. Osteoporosis and menopause Osteoporosis is a disease in which the bones lose minerals and strength with aging. This can result in bone fractures. If you are 5 years old or older, or if you are at risk for osteoporosis and fractures, ask your health care provider if you should:  Be screened for bone loss.  Take a calcium or vitamin D supplement to lower your risk of fractures.  Be given hormone replacement therapy (HRT) to treat symptoms of menopause. Follow these instructions at home: Lifestyle  Do not use any products that contain nicotine or tobacco, such as cigarettes, e-cigarettes, and chewing tobacco. If you need help quitting, ask your health care provider.  Do not use street drugs.  Do not share needles.  Ask your health care provider for help if you need support or information about quitting drugs. Alcohol use  Do not drink alcohol if: ? Your health care provider tells you not to drink. ? You are pregnant, may be pregnant, or are planning to become pregnant.  If you drink alcohol: ? Limit how much you use to 0-1 drink a day. ? Limit intake if you are breastfeeding.  Be aware of how much alcohol is in your drink. In the [place] one drink equals one 12 oz bottle of beer (355 mL), one 5 oz glass of wine (148 mL), or one 1 oz glass of hard liquor (44 mL). General instructions  Schedule regular health, dental, and eye exams.  Stay current with your vaccines.  Tell your health care provider if: ? You often feel depressed. ? You have ever been abused or do not feel safe at home. Summary  Adopting a  healthy lifestyle and getting preventive care are important in promoting health and wellness.  Follow your health care provider's instructions about healthy diet, exercising, and getting tested or screened for diseases.  Follow your health care provider's instructions on monitoring your cholesterol and blood pressure. This information is not intended to replace advice given to you by your health care provider. Make sure you discuss any questions you have with your health care provider. Document Revised: 09/09/2018 Document Reviewed: 09/09/2018 Elsevier Patient Education  2020 [employer].

## 2020-04-27 NOTE — Progress Notes (Signed)
   Subjective:   Patient ID: Monique [name], female    DOB: [birth year], 64 y.o.   MRN: [id number]  HPI The patient is a 64 YO female coming in for physical.   PMH, [place], social history reviewed and updated  Review of Systems  Constitutional: Negative.   HENT: Negative.   Eyes: Negative.   Respiratory: Negative for cough, chest tightness and shortness of breath.   Cardiovascular: Negative for chest pain, palpitations and leg swelling.  Gastrointestinal: Negative for abdominal distention, abdominal pain, constipation, diarrhea, nausea and vomiting.  Musculoskeletal: Positive for arthralgias.  Skin: Negative.   Neurological: Negative.   Psychiatric/Behavioral: Negative.     Objective:  Physical Exam Constitutional:      Appearance: She is well-developed.  HENT:     Head: Normocephalic and atraumatic.  Cardiovascular:     Rate and Rhythm: Normal rate and regular rhythm.  Pulmonary:     Effort: Pulmonary effort is normal. No respiratory distress.     Breath sounds: Normal breath sounds. No wheezing or rales.  Abdominal:     General: Bowel sounds are normal. There is no distension.     Palpations: Abdomen is soft.     Tenderness: There is no abdominal tenderness. There is no rebound.  Musculoskeletal:     Cervical back: Normal range of motion.  Skin:    General: Skin is warm and dry.  Neurological:     Mental Status: She is alert and oriented to person, place, and time.     Coordination: Coordination normal.     Vitals:   04/27/20 1406  BP: 124/78  Pulse: 64  Temp: 97.9 F (36.6 C)  TempSrc: Oral  SpO2: 98%  Weight: 128 lb (58.1 kg)  Height: 5' 6.25" (1.683 m)    This visit occurred during the SARS-CoV-2 public health emergency.  Safety protocols were in place, including screening questions prior to the visit, additional usage of staff PPE, and extensive cleaning of exam room while observing appropriate contact time as indicated for disinfecting solutions.    Assessment & Plan:

## 2020-04-28 NOTE — Assessment & Plan Note (Signed)
Referral to allergy specialist per patient request.

## 2020-04-28 NOTE — Assessment & Plan Note (Signed)
Flu shot yearly. Covid-19 complete. Shingrix 1st done and did not get second. Tetanus up to date. Colonoscopy referral to GI. Mammogram reminded to get done, pap smear up to date. Counseled about sun safety and mole surveillance. Counseled about the dangers of distracted driving. Given 10 year screening recommendations.

## 2020-04-28 NOTE — Assessment & Plan Note (Signed)
Anxiety still up and still managed by psych.

## 2020-04-28 NOTE — Assessment & Plan Note (Signed)
Uses tramadol #15 every 45-60 days. Refilled today. [place] narcotic database reviewed and no inappropriate fills.

## 2020-05-03 DIAGNOSIS — S6000XA Contusion of unspecified finger without damage to nail, initial encounter: Secondary | ICD-10-CM | POA: Diagnosis not present

## 2020-05-09 ENCOUNTER — Encounter: Payer: Self-pay | Admitting: Internal Medicine

## 2020-05-10 DIAGNOSIS — F3181 Bipolar II disorder: Secondary | ICD-10-CM | POA: Diagnosis not present

## 2020-05-10 DIAGNOSIS — F411 Generalized anxiety disorder: Secondary | ICD-10-CM | POA: Diagnosis not present

## 2020-05-21 ENCOUNTER — Other Ambulatory Visit: Payer: Self-pay | Admitting: Obstetrics & Gynecology

## 2020-06-01 DIAGNOSIS — R05 Cough: Secondary | ICD-10-CM | POA: Diagnosis not present

## 2020-06-01 DIAGNOSIS — J3089 Other allergic rhinitis: Secondary | ICD-10-CM | POA: Diagnosis not present

## 2020-06-01 DIAGNOSIS — J3081 Allergic rhinitis due to animal (cat) (dog) hair and dander: Secondary | ICD-10-CM | POA: Diagnosis not present

## 2020-06-01 DIAGNOSIS — K219 Gastro-esophageal reflux disease without esophagitis: Secondary | ICD-10-CM | POA: Diagnosis not present

## 2020-06-06 ENCOUNTER — Ambulatory Visit (AMBULATORY_SURGERY_CENTER): Payer: Self-pay

## 2020-06-06 ENCOUNTER — Other Ambulatory Visit: Payer: Self-pay

## 2020-06-06 VITALS — Ht 66.25 in | Wt 126.8 lb

## 2020-06-06 DIAGNOSIS — Z800 Family history of malignant neoplasm of digestive organs: Secondary | ICD-10-CM

## 2020-06-06 NOTE — Progress Notes (Signed)
No egg or soy allergy known to patient  No issues with past sedation with any surgeries or procedures No intubation problems in the past  No FH of Malignant Hyperthermia No diet pills per patient No home 02 use per patient  No blood thinners per patient  Pt reports issues with constipation -- will advise patient to complete daily Miralax for 5 days prior to procedure No A fib or A flutter  EMMI video via [place] 19 guidelines implemented in PV today with Pt and RN  COVID vaccines completed on 11/2019 per pt;  Due to the COVID-19 pandemic we are asking patients to follow these guidelines. Please only bring one care partner. Please be aware that your care partner may wait in the car in the parking lot or if they feel like they will be too hot to wait in the car, they may wait in the lobby on the 4th floor. All care partners are required to wear a mask the entire time (we do not have any that we can provide them), they need to practice social distancing, and we will do a Covid check for all patient's and care partners when you arrive. Also we will check their temperature and your temperature. If the care partner waits in their car they need to stay in the parking lot the entire time and we will call them on their cell phone when the patient is ready for discharge so they can bring the car to the front of the building. Also all patient's will need to wear a mask into building.

## 2020-06-15 ENCOUNTER — Other Ambulatory Visit: Payer: Self-pay

## 2020-06-28 ENCOUNTER — Ambulatory Visit (HOSPITAL_COMMUNITY)
Admission: EM | Admit: 2020-06-28 | Discharge: 2020-06-28 | Disposition: A | Discharge status: Home / Self Care | Payer: Medicare Other | Attending: Emergency Medicine | Admitting: Emergency Medicine

## 2020-06-28 ENCOUNTER — Other Ambulatory Visit: Payer: Self-pay

## 2020-06-28 ENCOUNTER — Encounter (HOSPITAL_COMMUNITY): Payer: Self-pay | Admitting: Emergency Medicine

## 2020-06-28 ENCOUNTER — Ambulatory Visit (HOSPITAL_COMMUNITY): Payer: Medicare Other

## 2020-06-28 DIAGNOSIS — R21 Rash and other nonspecific skin eruption: Secondary | ICD-10-CM

## 2020-06-28 MED ORDER — PERMETHRIN 5 % EX CREA: TOPICAL_CREAM | CUTANEOUS | 1 refills | Status: DC

## 2020-06-28 MED ORDER — TRIAMCINOLONE ACETONIDE 0.1 % EX CREA
1.0000 | TOPICAL_CREAM | Freq: Two times a day (BID) | CUTANEOUS | 0 refills | Status: DC
Start: 2020-06-28 — End: 2020-07-07

## 2020-06-28 MED ORDER — PREDNISONE 10 MG (21) PO TBPK
ORAL_TABLET | Freq: Every day | ORAL | 0 refills | Status: DC
Start: 2020-06-28 — End: 2020-07-07

## 2020-06-28 NOTE — Discharge Instructions (Signed)
This is likely related to an exposure in the woods.  Course of prednisone, use of the topical cream as provided as well.  May continue with benadryl combination medication as needed for itching.  Some of the characteristics are concerning for scabies, so in effort to be thorough I have sent the cream which would treat this, to the pharmacy as well.  Please return if any persistent or worsening of symptoms.

## 2020-06-28 NOTE — ED Triage Notes (Signed)
Pt presents with rash xs 5-7 days. States was in woods about a week ago and rash appeared after. States very itchy.

## 2020-06-28 NOTE — ED Provider Notes (Signed)
[facility]    CSN: [id number] Arrival date & time: 06/28/20  1255      History   Chief Complaint Chief Complaint  Patient presents with  . Rash    HPI Quinlynn Cuthbert is a 64 y.o. female.   David Stall presents with complaints of itching rash to ankles. Started last week and hasn't improved. Has been applying hydrocortisone and taking benadryl which hasn't helped. She had been in the woods prior to onset of symptoms, wearing shorter pants. Denies any previous similar. Some burning discomfort as well. Itching is worse at night. Hasn't spread at all.    ROS per HPI, negative if not otherwise mentioned.      Past Medical History:  Diagnosis Date  . Abnormal electrocardiogram   . Allergic rhinitis   . Allergy    seasonal allergies  . Anxiety    on meds  . Arthritis    R hip and left thumb  . Bipolar disorder ([place])   . Cancer ([place])    basal skin cell CA-1979  . Depression    some- on meds  . GERD (gastroesophageal reflux disease)    not on meds at this time  . History of hepatitis C    Hep B also  . History of skin cancer 1980  . HSV infection   . Memory loss   . MVP (mitral valve prolapse)   . Osteoporosis   . Paresthesia   . Unspecified chronic bronchitis [facility])     Patient Active Problem List   Diagnosis Date Noted  . Routine general medical examination at a health care facility 07/30/2014  . DJD (degenerative joint disease) 07/13/2012  . Depression 01/01/2012  . HSV 06/19/2007  . History of hepatitis C 06/19/2007  . Mitral valve disorder 06/19/2007  . Allergic rhinitis 06/19/2007    Past Surgical History:  Procedure Laterality Date  . APPENDECTOMY  11/08  . COLONOSCOPY  2014  . MOHS SURGERY    . RHINOPLASTY    . TUBAL LIGATION     bilateral    OB History    Gravida  0   Para  0   Term  0   Preterm  0   AB  0   Living  0     SAB  0   TAB  0   Ectopic  0   Multiple  0   Live Births  0             Home Medications    Prior to Admission medications   Medication Sig Start Date End Date Taking? Authorizing Provider  acyclovir ointment (ZOVIRAX) 5 % Apply 1 application topically every 3 (three) hours. For recurrences of HSV. 02/16/20   Princess Bruins, MD  albuterol (VENTOLIN HFA) 108 (90 Base) MCG/ACT inhaler SMARTSIG:2 Puff(s) By Mouth Every 4 Hours PRN 06/01/20   [provider]  alendronate (FOSAMAX) 70 MG tablet TAKE 1 TABLET BY MOUTH 1 TIME A WEEK WITH A FULL GLASS OF WATER AND ON AN EMPTY STOMACH 05/22/20   Princess Bruins, MD  azelastine (ASTELIN) 0.1 % nasal spray SMARTSIG:1-2 Spray(s) Both Nares Twice Daily PRN 06/01/20   [provider]  Calcium Carbonate-Vit D-Min (CALCIUM 1200 PO) Take by mouth. Take 1 tab once daily.    [provider]  Cholecalciferol (VITAMIN D3) 1000 UNITS CHEW Chew 1 tablet by mouth 2 (two) times daily.    [provider]  lamoTRIgine (LAMICTAL) 100 MG tablet Take  100 mg by mouth daily. Take 1/2 tablet for 50 mg daily.    [provider]  levocetirizine (XYZAL) 5 MG tablet SMARTSIG:1 Tablet(s) By Mouth Every Evening 06/02/20   [provider]  LORazepam (ATIVAN) 1 MG tablet Take 1-2 mg by mouth at bedtime. And as needed during the day    [provider]  metoprolol tartrate (LOPRESSOR) 50 MG tablet Take 1 tablet (50 mg total) by mouth 2 (two) times daily. 04/27/20   Hoyt Koch, MD  Omega-3 Fatty Acids (FISH OIL) 1000 MG CAPS Take by mouth. Take 1 cap by mouth daily.    [provider]  permethrin (ELIMITE) 5 % cream Apply neck to toes, leave in place for 10-12 hours and then rinse. May repeat in 14 days. 06/28/20   Zigmund Gottron, NP  predniSONE (STERAPRED UNI-PAK 21 TAB) 10 MG (21) TBPK tablet Take by mouth daily. Per box instruction 06/28/20   Augusto Gamble B, NP  QUEtiapine Fumarate (SEROQUEL PO) Take 50 mg by mouth. Take 1 tab at bedtime daily.    [provider]  sertraline (ZOLOFT) 50 MG tablet Take 50 mg by mouth daily. Take 1 tabv by mouth every morning.    [provider]  sodium chloride (OCEAN) 0.65 % SOLN nasal spray Place 1 spray into both nostrils as needed for congestion.    [provider]  Spacer/Aero-Holding [facility] DIAMOND) [place] USE AS DIRECTED AS NEEDED 06/02/20   [provider]  traMADol (ULTRAM) 50 MG tablet TAKE 1 TABLET BY MOUTH DAILY AS NEEDED, needs visit for refills 04/27/20   Hoyt Koch, MD  triamcinolone cream (KENALOG) 0.1 % Apply 1 application topically 2 (two) times daily. 06/28/20   Zigmund Gottron, NP  valACYclovir (VALTREX) 1000 MG tablet TAKE 1 TABLET(1000 MG) BY MOUTH DAILY 04/19/20   Princess Bruins, MD    Family History Family History  Problem Relation Age of Onset  . COPD Mother   . Allergies Mother   . Heart disease Mother   . Atrial fibrillation Mother   . Heart failure Mother   . Colon cancer Mother 38       Had 6 inches, resected.  . Colon polyps Mother 53  . COPD Paternal Uncle   . Allergies Father   . Rheum arthritis Father   . Bladder Cancer Father   . Prostate cancer Father 57  . Cancer - Other Father 19       Bladder  . Asthma Other   . Colon polyps Brother 71  . Pancreatic cancer Paternal Grandmother 22  . Esophageal cancer Neg Hx   . Stomach cancer Neg Hx   . Rectal cancer Neg Hx     Social History Social History   Tobacco Use  . Smoking status: Former Smoker    Packs/day: 1.50    Years: 25.00    Pack years: 37.50    Types: Cigarettes    Quit date: 09/30/1996    Years since quitting: 23.7  . Smokeless tobacco: Never Used  Vaping Use  . Vaping Use: Never used  Substance Use Topics  . Alcohol use: Yes    Comment: rare  . Drug use: No     Allergies   Patient has no known allergies.   Review of Systems Review of Systems   Physical Exam Triage Vital Signs ED Triage Vitals  Enc Vitals Group     BP 06/28/20 1318  120/62     Pulse  Rate 06/28/20 1318 65     Resp 06/28/20 1318 17     Temp 06/28/20 1318 98.4 F (36.9 C)     Temp Source 06/28/20 1318 Oral     SpO2 06/28/20 1318 99 %     Weight --      Height --      Head Circumference --      Peak Flow --      Pain Score 06/28/20 1317 0     Pain Loc --      Pain Edu? --      Excl. in [place]? --    No data found.  Updated Vital Signs BP 120/62 (BP Location: Right Arm)   Pulse 65   Temp 98.4 F (36.9 C) (Oral)   Resp 17   SpO2 99%   Visual Acuity Right Eye Distance:   Left Eye Distance:   Bilateral Distance:    Right Eye Near:   Left Eye Near:    Bilateral Near:     Physical Exam Constitutional:      General: She is not in acute distress.    Appearance: She is well-developed.  Cardiovascular:     Rate and Rhythm: Normal rate.  Pulmonary:     Effort: Pulmonary effort is normal.  Skin:    General: Skin is warm and dry.     Comments: Raised red rash to bilateral lower legs, just above ankles; linear pattern to left medial ankle, scattered singular papules to right lower leg; no vesicles, no pustules; non tender; blanch; no scabbing  Neurological:     Mental Status: She is alert and oriented to person, place, and time.      UC Treatments / Results  Labs (all labs ordered are listed, but only abnormal results are displayed) Labs Reviewed - No data to display  EKG   Radiology No results found.  Procedures Procedures (including critical care time)  Medications Ordered in UC Medications - No data to display  Initial Impression / Assessment and Plan / UC Course  I have reviewed the triage vital signs and the nursing notes.  Pertinent labs & imaging results that were available during my care of the patient were reviewed by me and considered in my medical decision making (see chart for details).    Likely dermatitis from plant exposure. Worse at night and some linear pattern to left ankle which is concerning for scabies,  although hasn't spread anywhere else and location is not typical of scabies. Patient would like to complete permethrin treatment, with prednisone and topical steroid provided. Return precautions provided. Patient verbalized understanding and agreeable to plan.   Final Clinical Impressions(s) / UC Diagnoses   Final diagnoses:  Rash     Discharge Instructions     This is likely related to an exposure in the woods.  Course of prednisone, use of the topical cream as provided as well.  May continue with benadryl combination medication as needed for itching.  Some of the characteristics are concerning for scabies, so in effort to be thorough I have sent the cream which would treat this, to the pharmacy as well.  Please return if any persistent or worsening of symptoms.     ED Prescriptions    Medication Sig Dispense Auth. Provider   predniSONE (STERAPRED UNI-PAK 21 TAB) 10 MG (21) TBPK tablet Take by mouth daily. Per box instruction 21 tablet Augusto Gamble B, NP   triamcinolone cream (KENALOG) 0.1 % Apply 1 application topically 2 (  two) times daily. 30 g Augusto Gamble B, NP   permethrin (ELIMITE) 5 % cream Apply neck to toes, leave in place for 10-12 hours and then rinse. May repeat in 14 days. 60 g Zigmund Gottron, NP     PDMP not reviewed this encounter.   Zigmund Gottron, NP 06/28/20 1354

## 2020-06-30 ENCOUNTER — Ambulatory Visit: Payer: Federal, State, Local not specified - PPO | Admitting: Family

## 2020-07-05 ENCOUNTER — Encounter: Payer: Federal, State, Local not specified - PPO | Admitting: Internal Medicine

## 2020-07-07 ENCOUNTER — Other Ambulatory Visit: Payer: Self-pay

## 2020-07-07 ENCOUNTER — Ambulatory Visit (AMBULATORY_SURGERY_CENTER): Payer: Federal, State, Local not specified - PPO | Admitting: Gastroenterology

## 2020-07-07 ENCOUNTER — Encounter: Payer: Self-pay | Admitting: Gastroenterology

## 2020-07-07 VITALS — BP 120/70 | HR 58 | Temp 97.7°F | Resp 17 | Ht 66.0 in | Wt 126.0 lb

## 2020-07-07 DIAGNOSIS — Z800 Family history of malignant neoplasm of digestive organs: Secondary | ICD-10-CM

## 2020-07-07 DIAGNOSIS — Z1211 Encounter for screening for malignant neoplasm of colon: Secondary | ICD-10-CM

## 2020-07-07 DIAGNOSIS — K621 Rectal polyp: Secondary | ICD-10-CM | POA: Diagnosis not present

## 2020-07-07 DIAGNOSIS — D128 Benign neoplasm of rectum: Secondary | ICD-10-CM

## 2020-07-07 MED ORDER — SODIUM CHLORIDE 0.9 % IV SOLN: 500.0000 mL | Freq: Once | INTRAVENOUS | Status: DC

## 2020-07-07 NOTE — Op Note (Signed)
[place] Patient Name: Monique [name] Procedure Date: 07/07/2020 1:44 PM MRN: [id number] Endoscopist: Milus Banister , MD Age: 64 Referring MD:  Date of Birth: [birth year] Gender: Female Account #: [id number] Procedure:                Colonoscopy Indications:              Screening in patient at increased risk: Family                            history of 1st-degree relative with colorectal                            cancer (mother had colon cancer in her 40s) Medicines:                Monitored Anesthesia Care Procedure:                Pre-Anesthesia Assessment:                           - Prior to the procedure, a History and Physical                            was performed, and patient medications and                            allergies were reviewed. The patient's tolerance of                            previous anesthesia was also reviewed. The risks                            and benefits of the procedure and the sedation                            options and risks were discussed with the patient.                            All questions were answered, and informed consent                            was obtained. Prior Anticoagulants: The patient has                            taken no previous anticoagulant or antiplatelet                            agents. ASA Grade Assessment: I - A normal, healthy                            patient. After reviewing the risks and benefits,                            the patient was deemed in satisfactory condition to  undergo the procedure.                           After obtaining informed consent, the colonoscope                            was passed under direct vision. Throughout the                            procedure, the patient's blood pressure, pulse, and                            oxygen saturations were monitored continuously. The                            Colonoscope was introduced  through the anus and                            advanced to the the cecum, identified by                            appendiceal orifice and ileocecal valve. The                            colonoscopy was performed without difficulty. The                            patient tolerated the procedure well. The quality                            of the bowel preparation was good. The ileocecal                            valve, appendiceal orifice, and rectum were                            photographed. Scope In: 1:47:53 PM Scope Out: 2:00:30 PM Scope Withdrawal Time: 0 hours 7 minutes 23 seconds  Total Procedure Duration: 0 hours 12 minutes 37 seconds  Findings:                 A 2 mm polyp was found in the rectum. The polyp was                            sessile. The polyp was removed with a cold biopsy                            forceps. Resection and retrieval were complete.                           The exam was otherwise without abnormality on                            direct and retroflexion views. Complications:  No immediate complications. Estimated blood loss:                            None. Estimated Blood Loss:     Estimated blood loss: none. Impression:               - One 2 mm polyp in the rectum, removed with a cold                            biopsy forceps. Resected and retrieved.                           - The examination was otherwise normal on direct                            and retroflexion views. Recommendation:           - Patient has a contact number available for                            emergencies. The signs and symptoms of potential                            delayed complications were discussed with the                            patient. Return to normal activities tomorrow.                            Written discharge instructions were provided to the                            patient.                           - Resume previous diet.                            - Continue present medications.                           - Await pathology results. Milus Banister, MD 07/07/2020 2:03:32 PM This report has been signed electronically.

## 2020-07-07 NOTE — Progress Notes (Signed)
Vital signs checked by:CW  The patient states no changes in medical or surgical history since pre-visit screening on 06/06/20.

## 2020-07-07 NOTE — Progress Notes (Signed)
PT taken to PACU. Monitors in place. VSS. Report given to RN. 

## 2020-07-07 NOTE — Patient Instructions (Signed)
Read all of the handouts given to you by your recovery room nurse.   Thank-you for choosing us for your healthcare needs today.  YOU HAD AN ENDOSCOPIC PROCEDURE TODAY AT THE [facility]:   Refer to the procedure report that was given to you for any specific questions about what was found during the examination.  If the procedure report does not answer your questions, please call your gastroenterologist to clarify.  If you requested that your care partner not be given the details of your procedure findings, then the procedure report has been included in a sealed envelope for you to review at your convenience later.  YOU SHOULD EXPECT: Some feelings of bloating in the abdomen. Passage of more gas than usual.  Walking can help get rid of the air that was put into your GI tract during the procedure and reduce the bloating. If you had a lower endoscopy (such as a colonoscopy or flexible sigmoidoscopy) you may notice spotting of blood in your stool or on the toilet paper. If you underwent a bowel prep for your procedure, you may not have a normal bowel movement for a few days.  Please Note:  You might notice some irritation and congestion in your nose or some drainage.  This is from the oxygen used during your procedure.  There is no need for concern and it should clear up in a day or so.  SYMPTOMS TO REPORT IMMEDIATELY:   Following lower endoscopy (colonoscopy or flexible sigmoidoscopy):  Excessive amounts of blood in the stool  Significant tenderness or worsening of abdominal pains  Swelling of the abdomen that is new, acute  Fever of 100F or higher   For urgent or emergent issues, a gastroenterologist can be reached at any hour by calling [phone]. Do not use MyChart messaging for urgent concerns.    DIET:  We do recommend a small meal at first, but then you may proceed to your regular diet.  Drink plenty of fluids but you should avoid alcoholic beverages for 24  hours.  ACTIVITY:  You should plan to take it easy for the rest of today and you should NOT DRIVE or use heavy machinery until tomorrow (because of the sedation medicines used during the test).    FOLLOW UP: Our staff will call the number listed on your records 48-72 hours following your procedure to check on you and address any questions or concerns that you may have regarding the information given to you following your procedure. If we do not reach you, we will leave a message.  We will attempt to reach you two times.  During this call, we will ask if you have developed any symptoms of COVID 19. If you develop any symptoms (ie: fever, flu-like symptoms, shortness of breath, cough etc.) before then, please call [phone].  If you test positive for Covid 19 in the 2 weeks post procedure, please call and report this information to us.    If any biopsies were taken you will be contacted by phone or by letter within the next 1-3 weeks.  Please call us at [phone] if you have not heard about the biopsies in 3 weeks.    SIGNATURES/CONFIDENTIALITY: You and/or your care partner have signed paperwork which will be entered into your electronic medical record.  These signatures attest to the fact that that the information above on your After Visit Summary has been reviewed and is understood.  Full responsibility of the confidentiality of this discharge   lies with you and/or your care-partner.

## 2020-07-11 ENCOUNTER — Telehealth: Payer: Self-pay

## 2020-07-11 ENCOUNTER — Telehealth: Payer: Self-pay | Admitting: *Deleted

## 2020-07-11 NOTE — Telephone Encounter (Signed)
°  Follow up Call-  Call back number 07/07/2020  Post procedure Call Back phone  # [phone]  Permission to leave phone message Yes  Some recent data might be hidden   LMOM to call back with any questions or concerns.  Also, call back if patient has developed fever, respiratory issues or been dx with COVID or had any family members or close contacts diagnosed since her procedure.

## 2020-07-11 NOTE — Telephone Encounter (Signed)
No answer, left message to call back later today, B.Earle Troiano RN. 

## 2020-07-17 ENCOUNTER — Encounter: Payer: Self-pay | Admitting: Gastroenterology

## 2020-07-31 ENCOUNTER — Telehealth: Payer: Federal, State, Local not specified - PPO | Admitting: Nurse Practitioner

## 2020-07-31 DIAGNOSIS — L247 Irritant contact dermatitis due to plants, except food: Secondary | ICD-10-CM | POA: Diagnosis not present

## 2020-07-31 MED ORDER — DESOXIMETASONE 0.25 % EX CREA: 1.0000 "application " | TOPICAL_CREAM | Freq: Two times a day (BID) | CUTANEOUS | 0 refills | Status: DC

## 2020-07-31 NOTE — Progress Notes (Signed)
We are sorry that you are not feeing well.  Here is how we plan to help!  Based on what you have shared with me it looks like you have had an allergic reaction to the oily resin from a group of plants.  This resin is very sticky, so it easily attaches to your skin, clothing, tools equipment, and pet's fur.    This blistering rash is often called poison ivy rash although it can come from contact with the leaves, stems and roots of poison ivy, poison oak and poison sumac.  The oily resin contains urushiol (u-ROO-she-ol) that produces a skin rash on exposed skin.  The severity of the rash depends on the amount of urushiol that gets on your skin.  A section of skin with more urushiol on it may develop a rash sooner.  The rash usually develops 12-48 hours after exposure and can last two to three weeks.  Your skin must come in direct contact with the plant's oil to be affected.  Blister fluid doesn't spread the rash.  However, if you come into contact with a piece of clothing or pet fur that has urushiol on it, the rash may spread out.  You can also transfer the oil to other parts of your body with your fingers.  Often the rash looks like a straight line because of the way the plant brushes against your skin.  Most poison ivy treatments are usually limited to self-care methods. Small areas of the rash will typically go away on its own in two to three weeks.  Since your rash is limited, I am recommending that you follow these recommendations:  Make sure that the clothes you were wearing and any towels or sheets that may have come in contact with the oil (urushiol) are washed in detergent and hot water.  Apply Benadryl or Caladryl lotion to the rash.  You may apply these as often as needed to control the itching.  Cool baths also often help with itching.  You may also apply an over-the-counter corticosteroid cream for the first few days.  Take oral antihistamines, such as diphenhydramine (Benadryl, others), which  may also help you sleep better.  Soak in a cool-water bath containing an oatmeal-based bath product (Aveeno).  Place Cool, wet compresses on the affected area for 15 to 30 minutes several times a day.  Avoid a hot shower or bath as this may increase your itching.     I have developed the following plan to treat your condition   use topicort as previous rx. I have sent in refill.   What can you do to prevent this rash?  Avoid the plants.  Learn how to identify poison ivy, poison oak and poison sumac in all seasons.  When hiking or engaging in other activities that might expose you to these plants, try to stay on cleared pathways.  If camping, make sure you pitch your tent in an area free of these plants.  Keep pets from running through wooded areas so that urushiol doesn't accidentally stick to their fur, which you may touch.  Remove or kill the plants.  In your yard, you can get rid of poison ivy by applying an herbicide or pulling it out of the ground, including the roots, while wearing heavy gloves.  Afterward remove the gloves and thoroughly wash them and your hands.  Don't burn poison ivy or related plants because the urushiol can be carried by smoke.  Wear protective clothing.  If needed,  protect your skin by wearing socks, boots, pants, long sleeves and vinyl gloves.  Wash your skin right away.  Washing off the oil with soap and water within 30 minutes of exposure may reduce your chances of getting a poison ivy rash.  Even washing after an hour or so can help reduce the severity of the rash.  If you walk through some poison ivy and then later touch your shoes, you may get some urushiol on your hands, which may then transfer to your face or body by touching or rubbing.  If the contaminated object isn't cleaned, the urushiol on it can still cause a skin reaction years later.    Be careful not to reuse towels after you have washed your skin.  Also carefully wash clothing in detergent and hot  water to remove all traces of the oil.  Handle contaminated clothing carefully so you don't transfer the urushiol to yourself, furniture, rugs or appliances.  Remember that pets can carry the oil on their fur and paws.  If you think your pet may be contaminated with urushiol, put on some long rubber gloves and give your pet a bath.  Finally, be careful not to burn these plants as the smoke can contain traces of the oil.  Inhaling the smoke may result in difficulty breathing. If that occurred you should see a physician as soon as possible.  See your doctor right away if:   The reaction is severe or widespread  You inhaled the smoke from burning poison ivy and are having difficulty breathing  Your skin continues to swell  The rash affects your eyes, mouth or genitals  Blisters are oozing pus  You develop a fever greater than 100 F (37.8 C)  The rash doesn't get better within a few weeks.  If you scratch the poison ivy rash, bacteria under your fingernails may cause the skin to become infected.  See your doctor if pus starts oozing from the blisters.  Treatment generally includes antibiotics.  Poison ivy treatments are usually limited to self-care methods.  And the rash typically goes away on its own in two to three weeks.     If the rash is widespread or results in a large number of blisters, your doctor may prescribe an oral corticosteroid, such as prednisone.  If a bacterial infection has developed at the rash site, your doctor may give you a prescription for an oral antibiotic.  MAKE SURE YOU   Understand these instructions.  Will watch your condition.  Will get help right away if you are not doing well or get worse.  Thank you for choosing an e-visit. Your e-visit answers were reviewed by a board certified advanced clinical practitioner to complete your personal care plan. Depending upon the condition, your plan could have included both over the counter or prescription  medications.  Please review your pharmacy choice. If there is a problem you may use MyChart messaging to have the prescription routed to another pharmacy.   Your safety is important to [place]. If you have drug allergies check your prescription carefully.  You can use MyChart to ask questions about today's visit, request a non-urgent call back, or ask for a work or school excuse for 24 hours related to this e-Visit. If it has been greater than 24 hours you will need to follow up with your provider, or enter a new e-Visit to address those concerns.   You will get an email in the next two days asking about  your experience. I hope that your e-visit has been valuable and will speed your recovery Thank you for choosing an e-visit.   5-10 minutes spent reviewing and documenting in chart.

## 2020-08-07 DIAGNOSIS — H25813 Combined forms of age-related cataract, bilateral: Secondary | ICD-10-CM | POA: Diagnosis not present

## 2020-08-07 DIAGNOSIS — H04123 Dry eye syndrome of bilateral lacrimal glands: Secondary | ICD-10-CM | POA: Diagnosis not present

## 2020-08-07 DIAGNOSIS — H43813 Vitreous degeneration, bilateral: Secondary | ICD-10-CM | POA: Diagnosis not present

## 2020-08-07 DIAGNOSIS — H1045 Other chronic allergic conjunctivitis: Secondary | ICD-10-CM | POA: Diagnosis not present

## 2020-08-08 DIAGNOSIS — F3181 Bipolar II disorder: Secondary | ICD-10-CM | POA: Diagnosis not present

## 2020-08-08 DIAGNOSIS — F411 Generalized anxiety disorder: Secondary | ICD-10-CM | POA: Diagnosis not present

## 2020-09-05 ENCOUNTER — Telehealth (INDEPENDENT_AMBULATORY_CARE_PROVIDER_SITE_OTHER): Payer: Medicare HMO | Admitting: Internal Medicine

## 2020-09-05 ENCOUNTER — Encounter: Payer: Self-pay | Admitting: Internal Medicine

## 2020-09-05 DIAGNOSIS — J111 Influenza due to unidentified influenza virus with other respiratory manifestations: Secondary | ICD-10-CM | POA: Diagnosis not present

## 2020-09-05 MED ORDER — BENZONATATE 200 MG PO CAPS: 200.0000 mg | ORAL_CAPSULE | Freq: Three times a day (TID) | ORAL | 0 refills | Status: DC | PRN

## 2020-09-05 MED ORDER — ALBUTEROL SULFATE HFA 108 (90 BASE) MCG/ACT IN AERS: 2.0000 | INHALATION_SPRAY | Freq: Four times a day (QID) | RESPIRATORY_TRACT | 0 refills | Status: DC | PRN

## 2020-09-05 MED ORDER — PREDNISONE 20 MG PO TABS: 40.0000 mg | ORAL_TABLET | Freq: Every day | ORAL | 0 refills | Status: DC

## 2020-09-05 NOTE — Progress Notes (Signed)
Virtual Visit via Video Note  I connected with Monique [name] on 09/05/20 at  3:00 PM EST by a video enabled telemedicine application and verified that I am speaking with the correct person using two identifiers.  The patient and the provider were at separate locations throughout the entire encounter. Patient location: home, Provider location: work   I discussed the limitations of evaluation and management by telemedicine and the availability of in person appointments. The patient expressed understanding and agreed to proceed. The patient and the provider were the only parties present for the visit unless noted in HPI below.  History of Present Illness: The patient is a 64 y.o. female with visit for cough, congestion, fevers, chills. Started yesterday. Fever to 100 F. Has no SOB but is concerned about this due to history. Denies trying anything for symptoms. Overall it is not improving. Had booster 2 weeks ago for covid-19.  Observations/Objective: Appearance: normal, some coughing during visit, breathing appears normal, speaking in full sentences, casual grooming, abdomen does not appear distended, throat not well visualized, memory normal, mental status is A and O times 3  Assessment and Plan: See problem oriented charting  Follow Up Instructions: recommend covid-19 testing, rx prednisone and albuterol, tessalon perles for cough  I discussed the assessment and treatment plan with the patient. The patient was provided an opportunity to ask questions and all were answered. The patient agreed with the plan and demonstrated an understanding of the instructions.   The patient was advised to call back or seek an in-person evaluation if the symptoms worsen or if the condition fails to improve as anticipated.  Hoyt Koch, MD

## 2020-09-06 DIAGNOSIS — J111 Influenza due to unidentified influenza virus with other respiratory manifestations: Secondary | ICD-10-CM | POA: Insufficient documentation

## 2020-09-06 NOTE — Assessment & Plan Note (Signed)
Advised covid-19 testing although she is vaccinated and has had booster due to symptoms and the high contagious potential of delta variant. Rx prednisone, albuterol and tessalon perles. Check in with [place] later this week on clinical status.

## 2020-09-08 ENCOUNTER — Encounter: Payer: Self-pay | Admitting: Internal Medicine

## 2020-09-08 DIAGNOSIS — Z20822 Contact with and (suspected) exposure to covid-19: Secondary | ICD-10-CM | POA: Diagnosis not present

## 2020-09-11 MED ORDER — PROMETHAZINE-DM 6.25-15 MG/5ML PO SYRP: 5.0000 mL | ORAL_SOLUTION | Freq: Two times a day (BID) | ORAL | 0 refills | Status: DC | PRN

## 2020-10-05 DIAGNOSIS — Z20822 Contact with and (suspected) exposure to covid-19: Secondary | ICD-10-CM | POA: Diagnosis not present

## 2020-11-07 DIAGNOSIS — F3181 Bipolar II disorder: Secondary | ICD-10-CM | POA: Diagnosis not present

## 2020-11-07 DIAGNOSIS — F411 Generalized anxiety disorder: Secondary | ICD-10-CM | POA: Diagnosis not present

## 2020-11-16 ENCOUNTER — Other Ambulatory Visit: Payer: Self-pay | Admitting: Internal Medicine

## 2020-11-17 NOTE — Telephone Encounter (Signed)
See below

## 2020-11-22 ENCOUNTER — Other Ambulatory Visit: Payer: Self-pay | Admitting: Obstetrics & Gynecology

## 2020-11-22 DIAGNOSIS — M810 Age-related osteoporosis without current pathological fracture: Secondary | ICD-10-CM

## 2020-11-22 DIAGNOSIS — Z1231 Encounter for screening mammogram for malignant neoplasm of breast: Secondary | ICD-10-CM

## 2020-11-24 ENCOUNTER — Telehealth: Payer: Self-pay | Admitting: Internal Medicine

## 2020-11-24 NOTE — Telephone Encounter (Signed)
Monique [name] w/ [facility] called and said they are needing a PA for traMADol (ULTRAM) 50 MG tablet. Please advise.    Phone: [phone] Ref # [id number]

## 2020-11-28 NOTE — Telephone Encounter (Signed)
PA has been initiated. Waiting for a response.

## 2020-12-07 NOTE — Telephone Encounter (Signed)
Tramadol has been approved through 09/29/2021.

## 2021-01-24 DIAGNOSIS — J3089 Other allergic rhinitis: Secondary | ICD-10-CM | POA: Diagnosis not present

## 2021-01-24 DIAGNOSIS — R059 Cough, unspecified: Secondary | ICD-10-CM | POA: Diagnosis not present

## 2021-01-24 DIAGNOSIS — J3081 Allergic rhinitis due to animal (cat) (dog) hair and dander: Secondary | ICD-10-CM | POA: Diagnosis not present

## 2021-01-24 DIAGNOSIS — K219 Gastro-esophageal reflux disease without esophagitis: Secondary | ICD-10-CM | POA: Diagnosis not present

## 2021-01-30 DIAGNOSIS — Z20822 Contact with and (suspected) exposure to covid-19: Secondary | ICD-10-CM | POA: Diagnosis not present

## 2021-02-01 ENCOUNTER — Other Ambulatory Visit: Payer: Self-pay | Admitting: Internal Medicine

## 2021-02-05 DIAGNOSIS — Z20822 Contact with and (suspected) exposure to covid-19: Secondary | ICD-10-CM | POA: Diagnosis not present

## 2021-02-12 ENCOUNTER — Other Ambulatory Visit: Payer: Self-pay

## 2021-02-12 ENCOUNTER — Ambulatory Visit: Payer: Medicare HMO | Admitting: Dermatology

## 2021-02-12 ENCOUNTER — Encounter: Payer: Self-pay | Admitting: Dermatology

## 2021-02-12 DIAGNOSIS — Z85828 Personal history of other malignant neoplasm of skin: Secondary | ICD-10-CM | POA: Diagnosis not present

## 2021-02-12 DIAGNOSIS — C44319 Basal cell carcinoma of skin of other parts of face: Secondary | ICD-10-CM

## 2021-02-12 DIAGNOSIS — L430 Hypertrophic lichen planus: Secondary | ICD-10-CM | POA: Diagnosis not present

## 2021-02-12 DIAGNOSIS — C44712 Basal cell carcinoma of skin of right lower limb, including hip: Secondary | ICD-10-CM | POA: Diagnosis not present

## 2021-02-12 DIAGNOSIS — L821 Other seborrheic keratosis: Secondary | ICD-10-CM

## 2021-02-12 DIAGNOSIS — Z1283 Encounter for screening for malignant neoplasm of skin: Secondary | ICD-10-CM

## 2021-02-12 DIAGNOSIS — D485 Neoplasm of uncertain behavior of skin: Secondary | ICD-10-CM

## 2021-02-12 DIAGNOSIS — L570 Actinic keratosis: Secondary | ICD-10-CM

## 2021-02-13 ENCOUNTER — Other Ambulatory Visit: Payer: Self-pay | Admitting: Internal Medicine

## 2021-02-13 DIAGNOSIS — F3181 Bipolar II disorder: Secondary | ICD-10-CM | POA: Diagnosis not present

## 2021-02-13 DIAGNOSIS — F411 Generalized anxiety disorder: Secondary | ICD-10-CM | POA: Diagnosis not present

## 2021-02-13 MED ORDER — RIFAXIMIN 550 MG PO TABS: 550.0000 mg | ORAL_TABLET | Freq: Three times a day (TID) | ORAL | 0 refills | Status: DC

## 2021-02-16 ENCOUNTER — Telehealth: Payer: Self-pay | Admitting: Internal Medicine

## 2021-02-16 MED ORDER — RIFAXIMIN 550 MG PO TABS
550.0000 mg | ORAL_TABLET | Freq: Three times a day (TID) | ORAL | 0 refills | Status: DC
Start: 2021-02-16 — End: 2021-03-02

## 2021-02-16 NOTE — Telephone Encounter (Signed)
I called Monique [name] and told her that we are sending the xifaxan rx to [facility] to get them to help [place] with the prior authorization. She wrote down their name and number. I cancelled the xifaxan rx sent to [facility].

## 2021-02-19 ENCOUNTER — Telehealth: Payer: Self-pay

## 2021-02-19 NOTE — Telephone Encounter (Signed)
-----   Message from Lavonna Monarch, MD sent at 02/16/2021  6:33 AM EDT ----- Please schedule 30 minutes with may; we will plan on doing the leg lesion and decide on whether to refer to Mohs surgery for the forehead.

## 2021-02-19 NOTE — Telephone Encounter (Signed)
Phone call to patient with her pathology results. Patient aware of results.  

## 2021-02-23 NOTE — Telephone Encounter (Signed)
I called [facility] and they are still working on her prior authorization for her xifaxan. I left a voice mail to let her know.

## 2021-02-25 ENCOUNTER — Encounter: Payer: Self-pay | Admitting: Dermatology

## 2021-02-25 NOTE — Progress Notes (Signed)
Follow-Up Visit   Subjective  Monique [name] is a 65 y.o. female who presents for the following: Annual Exam (Mid forehead x 2 years- no itch no bleed).  Skin examination, several spots of concern on face arm and leg. Location:  Duration:  Quality:  Associated Signs/Symptoms: Modifying Factors:  Severity:  Timing: Context: History of nonmelanoma skin cancer.  Objective  Well appearing patient in no apparent distress; mood and affect are within normal limits. Objective  Right Lower Back: Light brown flattopped textured 5 mm papule  Objective  Left Shoulder - Posterior, Left Upper Arm - Anterior, Left Upper Arm - Posterior, Right Lower Leg - Anterior: 3 To 5 mm pink gritty crusts  Objective  Right Thigh - Anterior: 8 mm waxy pink crust     Objective  Right Antecubital Fossa: 1.1 cm waxy slightly raised plaque     Objective  Mid Forehead: Pearly 6 mm dermal papule       A full examination was performed including scalp, head, eyes, ears, nose, lips, neck, chest, axillae, abdomen, back, buttocks, bilateral upper extremities, bilateral lower extremities, hands, feet, fingers, toes, fingernails, and toenails. All findings within normal limits unless otherwise noted below.  Areas beneath undergarments not fully examined.   Assessment & Plan    Seborrheic keratosis Right Lower Back  Leave If stable  AK (actinic keratosis) (4) Left Shoulder - Posterior; Left Upper Arm - Anterior; Left Upper Arm - Posterior; Right Lower Leg - Anterior  Destruction of lesion - Left Shoulder - Posterior, Left Upper Arm - Anterior, Left Upper Arm - Posterior, Right Lower Leg - Anterior Complexity: simple   Destruction method: cryotherapy   Informed consent: discussed and consent obtained   Timeout:  patient name, date of birth, surgical site, and procedure verified Lesion destroyed using liquid nitrogen: Yes   Cryotherapy cycles:  3 Outcome: patient tolerated procedure  well with no complications    Neoplasm of uncertain behavior of skin (3) Right Thigh - Anterior  Skin / nail biopsy Type of biopsy: tangential   Informed consent: discussed and consent obtained   Timeout: patient name, date of birth, surgical site, and procedure verified   Procedure prep:  Patient was prepped and draped in usual sterile fashion (Non sterile) Prep type:  Chlorhexidine Anesthesia: the lesion was anesthetized in a standard fashion   Anesthetic:  1% lidocaine w/ epinephrine 1-100,000 local infiltration Instrument used: flexible razor blade   Outcome: patient tolerated procedure well   Post-procedure details: wound care instructions given    Specimen 1 - Surgical pathology Differential Diagnosis: bcc vs bcc  Check Margins: No  Right Antecubital Fossa  Skin / nail biopsy Type of biopsy: tangential   Informed consent: discussed and consent obtained   Timeout: patient name, date of birth, surgical site, and procedure verified   Procedure prep:  Patient was prepped and draped in usual sterile fashion (Non sterile) Prep type:  Chlorhexidine Anesthesia: the lesion was anesthetized in a standard fashion   Anesthetic:  1% lidocaine w/ epinephrine 1-100,000 local infiltration Instrument used: flexible razor blade   Outcome: patient tolerated procedure well   Post-procedure details: wound care instructions given    Specimen 2 - Surgical pathology Differential Diagnosis: bcc vs scc  Check Margins: No  Mid Forehead  Skin / nail biopsy Type of biopsy: tangential   Informed consent: discussed and consent obtained   Timeout: patient name, date of birth, surgical site, and procedure verified   Procedure prep:  Patient  was prepped and draped in usual sterile fashion (Non sterile) Prep type:  Chlorhexidine Anesthesia: the lesion was anesthetized in a standard fashion   Anesthetic:  1% lidocaine w/ epinephrine 1-100,000 local infiltration Instrument used: flexible razor  blade   Outcome: patient tolerated procedure well   Post-procedure details: wound care instructions given    Specimen 3 - Surgical pathology Differential Diagnosis: bcc vs scc  Check Margins: No      I, Lavonna Monarch, MD, have reviewed all documentation for this visit.  The documentation on 02/25/21 for the exam, diagnosis, procedures, and orders are all accurate and complete.

## 2021-03-02 ENCOUNTER — Other Ambulatory Visit: Payer: Self-pay | Admitting: Obstetrics & Gynecology

## 2021-03-02 MED ORDER — AMOXICILLIN-POT CLAVULANATE 875-125 MG PO TABS: 1.0000 | ORAL_TABLET | Freq: Two times a day (BID) | ORAL | 0 refills | Status: DC

## 2021-03-02 NOTE — Telephone Encounter (Signed)
Check for samples  If not possible try Augmentin 875 mg bid x 10 d

## 2021-03-02 NOTE — Telephone Encounter (Signed)
We don't have xifaxan samples so I have sent in the Augmentin and I left her a detailed message about this treatment plan.

## 2021-03-02 NOTE — Telephone Encounter (Signed)
[name] has been approved thru 05/30/21 however the co-pay is $763.07 for Monique [name]. She cannot afford this. Please advise Sir, thank you.

## 2021-03-05 NOTE — Telephone Encounter (Signed)
Medication refill request: Valtrex  Last AEX:  02/16/20  Next AEX: note sent to pharmacy for patient to schedule aex  Last MMG (if hormonal medication request): NA Refill authorized: #[id number]  With 1 Rf pended for today

## 2021-04-10 ENCOUNTER — Telehealth: Payer: Self-pay | Admitting: Internal Medicine

## 2021-04-10 DIAGNOSIS — F411 Generalized anxiety disorder: Secondary | ICD-10-CM | POA: Diagnosis not present

## 2021-04-10 DIAGNOSIS — F3181 Bipolar II disorder: Secondary | ICD-10-CM | POA: Diagnosis not present

## 2021-04-10 NOTE — Telephone Encounter (Signed)
See below

## 2021-04-10 NOTE — Telephone Encounter (Signed)
   Patient calling request order for stool culture. She thinks she has c-diff  Please advise

## 2021-04-11 ENCOUNTER — Encounter: Payer: Self-pay | Admitting: Internal Medicine

## 2021-04-11 NOTE — Telephone Encounter (Signed)
Should have virtual visit for diarrhea so appropriate testing can be ordered.

## 2021-04-11 NOTE — Telephone Encounter (Signed)
See my chart message

## 2021-04-12 ENCOUNTER — Telehealth (INDEPENDENT_AMBULATORY_CARE_PROVIDER_SITE_OTHER): Payer: Medicare HMO | Admitting: Family Medicine

## 2021-04-12 ENCOUNTER — Encounter: Payer: Self-pay | Admitting: Family Medicine

## 2021-04-12 VITALS — Wt 128.0 lb

## 2021-04-12 DIAGNOSIS — R110 Nausea: Secondary | ICD-10-CM | POA: Diagnosis not present

## 2021-04-12 DIAGNOSIS — R195 Other fecal abnormalities: Secondary | ICD-10-CM

## 2021-04-12 NOTE — Patient Instructions (Signed)
I am glad your stools have improved today and hope you are feeling all better soon!  Stay hydrated.  Avoid dairy.  Can use imodium if any diarrhea.  Consider covid testing.   Notify your gastroenterologist given your recent treatment with them, particular if symptoms do not fully resolve over the next few days or if symptoms worsen.  Seek in person care promptly if your symptoms worsen, new concerns arise or you are not improving with treatment.  It was nice to meet you today. I help [place] out with telemedicine visits on Tuesdays and Thursdays and am available for visits on those days. If you have any concerns or questions following this visit please schedule a follow up visit with your Primary Care doctor or seek care at a local urgent care clinic to avoid delays in care.

## 2021-04-12 NOTE — Progress Notes (Signed)
Virtual Visit via Video Note  I connected with Neoma Laming  on 04/12/21 at  1:20 PM EDT by a video enabled telemedicine application and verified that I am speaking with the correct person using two identifiers.  Location patient: home, [place] Location provider:work or home office Persons participating in the virtual visit: patient, provider  I discussed the limitations of evaluation and management by telemedicine and the availability of in person appointments. The patient expressed understanding and agreed to proceed.   HPI:  Acute telemedicine visit for Diarrhea: -Onset: 3 days ago -she has been stressed out lately - her AC went out -Symptoms include: nausea, upset stomach and loose stools x3 days, felt weak, poor appetite, some nasal congestion - now improved today, with no diarrhea today -Denies: fever, blood in the stools, abd pain today, CP, SOB, weight loss, reported travel -Has tried:none -Pertinent past medical history:see below -Pertinent medication allergies:No Known Allergies -COVID-19 vaccine status: vaccinated and boosted -her sister is in ICU and has C. Diff - has been in there for several weeks, pt did see her sister on admission -hx of SBO and reports just finish abx recently for this - provided by her GI doc  ROS: See pertinent positives and negatives per HPI.  Past Medical History:  Diagnosis Date   Abnormal electrocardiogram    Allergic rhinitis    Allergy    seasonal allergies   Anxiety    on meds   Arthritis    R hip and left thumb   Bipolar disorder (HCC)    Cancer (HCC)    basal skin cell CA-1979   Depression    some- on meds   GERD (gastroesophageal reflux disease)    not on meds at this time   History of hepatitis C    Hep B also   History of skin cancer 1980   HSV infection    Memory loss    MVP (mitral valve prolapse)    Osteoporosis    Paresthesia    Unspecified chronic bronchitis ([place])     Past Surgical History:  Procedure Laterality Date    APPENDECTOMY  11/08   COLONOSCOPY  2014   MOHS SURGERY     RHINOPLASTY     TUBAL LIGATION     bilateral     Current Outpatient Medications:    acyclovir ointment (ZOVIRAX) 5 %, Apply 1 application topically every 3 (three) hours. For recurrences of HSV., Disp: 15 g, Rfl: 4   albuterol (VENTOLIN HFA) 108 (90 Base) MCG/ACT inhaler, SMARTSIG:2 Puff(s) By Mouth Every 4 Hours PRN, Disp: , Rfl:    albuterol (VENTOLIN HFA) 108 (90 Base) MCG/ACT inhaler, Inhale 2 puffs into the lungs every 6 (six) hours as needed for wheezing or shortness of breath., Disp: 8 g, Rfl: 0   alendronate (FOSAMAX) 70 MG tablet, TAKE 1 TABLET BY MOUTH 1 TIME A WEEK WITH A FULL GLASS OF WATER AND ON AN EMPTY STOMACH, Disp: 12 tablet, Rfl: 3   azelastine (ASTELIN) 0.1 % nasal spray, SMARTSIG:1-2 Spray(s) Both Nares Twice Daily PRN, Disp: , Rfl:    Biotin w/ Vitamins C & E (HAIR/SKIN/NAILS PO), Take by mouth., Disp: , Rfl:    Calcium Carbonate-Vit D-Min (CALCIUM 1200 PO), Take by mouth. Take 1 tab once daily., Disp: , Rfl:    Cholecalciferol (VITAMIN D3) 1000 UNITS CHEW, Chew 1 tablet by mouth 2 (two) times daily., Disp: , Rfl:    lamoTRIgine (LAMICTAL) 100 MG tablet, Take 100 mg by mouth daily. Take  1/2 tablet for 50 mg daily., Disp: , Rfl:    levocetirizine (XYZAL) 5 MG tablet, SMARTSIG:1 Tablet(s) By Mouth Every Evening, Disp: , Rfl:    LORazepam (ATIVAN) 1 MG tablet, Take 1-2 mg by mouth at bedtime. And as needed during the day, Disp: , Rfl:    metoprolol tartrate (LOPRESSOR) 50 MG tablet, Take 1 tablet (50 mg total) by mouth 2 (two) times daily., Disp: 180 tablet, Rfl: 3   QUEtiapine Fumarate (SEROQUEL PO), Take 50 mg by mouth. Take 1 tab at bedtime daily., Disp: , Rfl:    sertraline (ZOLOFT) 50 MG tablet, Take 50 mg by mouth daily. Take 1 tabv by mouth every morning., Disp: , Rfl:    sodium chloride (OCEAN) 0.65 % SOLN nasal spray, Place 1 spray into both nostrils as needed for congestion., Disp: , Rfl:     Spacer/Aero-Holding Chambers (OPTICHAMBER DIAMOND) MISC, USE AS DIRECTED AS NEEDED, Disp: , Rfl:    traMADol (ULTRAM) 50 MG tablet, TAKE 1 TABLET BY MOUTH DAILY AS NEEDED, Disp: 15 tablet, Rfl: 4   valACYclovir (VALTREX) 1000 MG tablet, TAKE 1 TABLET(1000 MG) BY MOUTH DAILY, Disp: 30 tablet, Rfl: 1  EXAM:  VITALS per patient if applicable: BP 032/12, Wt 128.5 T 97.7  GENERAL: alert, oriented, appears well and in no acute distress  HEENT: atraumatic, conjunttiva clear, no obvious abnormalities on inspection of external nose and ears  NECK: normal movements of the head and neck  LUNGS: on inspection no signs of respiratory distress, breathing rate appears normal, no obvious gross SOB, gasping or wheezing  CV: no obvious cyanosis  MS: moves all visible extremities without noticeable abnormality  PSYCH/NEURO: pleasant and cooperative, no obvious depression or anxiety, speech and thought processing grossly intact  ASSESSMENT AND PLAN:  Discussed the following assessment and plan:  Loose stools  Nausea  -we discussed possible serious and likely etiologies, options for evaluation and workup, limitations of telemedicine visit vs in person visit, treatment, treatment risks and precautions. Pt prefers to treat via telemedicine empirically rather than in person at this moment. Given doing better today, query viral gastroenteritis, stress vs other. Discussed possibility of covid as well, however she feels th nasal congestion is chronic. With her recent hx of SBO and tx, abx and exposure to her sister with c. Diff did advised that she follow up with her gastroenterologist, particularly if any further symptoms. In the interim avoid dairy, stay hydrated and use imodium if needed. She agrees and reports  she will notify her GI doc today.  Advised to seek prompt in person care if worsening, new symptoms arise, or if is not improving with treatment. Discussed options for inperson care if PCP office not  available. Did let this patient know that I only do telemedicine on Tuesdays and Thursdays for [place]. Advised to schedule follow up visit with PCP or [facility] if any further questions or concerns to avoid delays in care.   I discussed the assessment and treatment plan with the patient. The patient was provided an opportunity to ask questions and all were answered. The patient agreed with the plan and demonstrated an understanding of the instructions.     Lucretia Kern, DO

## 2021-04-24 ENCOUNTER — Ambulatory Visit: Payer: Medicare HMO | Admitting: Physician Assistant

## 2021-04-24 ENCOUNTER — Other Ambulatory Visit: Payer: Self-pay

## 2021-04-24 ENCOUNTER — Encounter: Payer: Self-pay | Admitting: Physician Assistant

## 2021-04-24 DIAGNOSIS — R21 Rash and other nonspecific skin eruption: Secondary | ICD-10-CM | POA: Diagnosis not present

## 2021-04-26 ENCOUNTER — Encounter: Payer: Self-pay | Admitting: Physician Assistant

## 2021-04-26 ENCOUNTER — Encounter: Payer: Self-pay | Admitting: Dermatology

## 2021-04-26 ENCOUNTER — Ambulatory Visit (INDEPENDENT_AMBULATORY_CARE_PROVIDER_SITE_OTHER): Payer: Medicare HMO | Admitting: Dermatology

## 2021-04-26 ENCOUNTER — Other Ambulatory Visit: Payer: Self-pay

## 2021-04-26 DIAGNOSIS — C44712 Basal cell carcinoma of skin of right lower limb, including hip: Secondary | ICD-10-CM

## 2021-04-26 DIAGNOSIS — C4491 Basal cell carcinoma of skin, unspecified: Secondary | ICD-10-CM

## 2021-04-26 NOTE — Patient Instructions (Signed)

## 2021-04-26 NOTE — Progress Notes (Signed)
   Follow-Up Visit   Subjective  Monique [name] is a 65 y.o. female who presents for the following: Skin Problem (Lips red and peeling x few weeks Aquaphor and hydrocortisone and miconazole as treatment seems to be better).   The following portions of the chart were reviewed this encounter and updated as appropriate:  Tobacco  Allergies  Meds  Problems  Med Hx  Surg Hx  Fam Hx      Objective  Well appearing patient in no apparent distress; mood and affect are within normal limits.  A full examination was performed including scalp, head, eyes, ears, nose, lips, neck, chest, axillae, abdomen, back, buttocks, bilateral upper extremities, bilateral lower extremities, hands, feet, fingers, toes, fingernails, and toenails. All findings within normal limits unless otherwise noted below.  Mid Upper Vermilion Lip, perioral Lips clear today.    Assessment & Plan  Rash Mid Upper Vermilion Lip; perioral  Return to clinic if recurs.Ok to use hydrocortisone if it starts again. May need referral to allergist.    I, Sueo Cullen, PA-C, have reviewed all documentation's for this visit.  The documentation on 04/26/21 for the exam, diagnosis, procedures and orders are all accurate and complete.

## 2021-04-27 ENCOUNTER — Other Ambulatory Visit: Payer: Self-pay | Admitting: Obstetrics & Gynecology

## 2021-04-28 ENCOUNTER — Encounter: Payer: Self-pay | Admitting: Dermatology

## 2021-05-01 ENCOUNTER — Ambulatory Visit
Admission: RE | Admit: 2021-05-01 | Discharge: 2021-05-01 | Disposition: A | Discharge status: Home / Self Care | Payer: Medicare HMO | Source: Ambulatory Visit | Attending: Obstetrics & Gynecology | Admitting: Obstetrics & Gynecology

## 2021-05-01 ENCOUNTER — Other Ambulatory Visit: Payer: Self-pay

## 2021-05-01 ENCOUNTER — Telehealth: Payer: Self-pay | Admitting: *Deleted

## 2021-05-01 DIAGNOSIS — Z1231 Encounter for screening mammogram for malignant neoplasm of breast: Secondary | ICD-10-CM

## 2021-05-01 DIAGNOSIS — M810 Age-related osteoporosis without current pathological fracture: Secondary | ICD-10-CM

## 2021-05-01 DIAGNOSIS — Z780 Asymptomatic menopausal state: Secondary | ICD-10-CM | POA: Diagnosis not present

## 2021-05-01 DIAGNOSIS — M8589 Other specified disorders of bone density and structure, multiple sites: Secondary | ICD-10-CM | POA: Diagnosis not present

## 2021-05-01 MED ORDER — VALACYCLOVIR HCL 1 G PO TABS: ORAL_TABLET | ORAL | 2 refills | Status: DC

## 2021-05-01 MED ORDER — ALENDRONATE SODIUM 70 MG PO TABS: ORAL_TABLET | ORAL | 0 refills | Status: DC

## 2021-05-01 NOTE — Telephone Encounter (Signed)
Patient has annual exam scheduled on 08/14/21, needs refill on Fosamax 70 mg tablet. Rx sent. Also requesting refill on Valtrex Rx sent.

## 2021-05-16 ENCOUNTER — Encounter: Payer: Self-pay | Admitting: Dermatology

## 2021-05-16 NOTE — Progress Notes (Signed)
   Follow-Up Visit   Subjective  Monique [name] is a 65 y.o. female who presents for the following: Procedure (Patient here today for BCC x 2 right thigh - anterior, and mid forehead possible =mohs).  BCC right leg Location:  Duration:  Quality:  Associated Signs/Symptoms: Modifying Factors:  Severity:  Timing: Context:   Objective  Well appearing patient in no apparent distress; mood and affect are within normal limits. Right Thigh - Anterior Lesion identified by Dr.Dayra Rapley and nurse in room.      A focused examination was performed including arms, legs. Relevant physical exam findings are noted in the Assessment and Plan.   Assessment & Plan    Basal cell carcinoma (BCC), unspecified site Right Thigh - Anterior  Destruction of lesion Complexity: simple   Destruction method: electrodesiccation and curettage   Informed consent: discussed and consent obtained   Timeout:  patient name, date of birth, surgical site, and procedure verified Anesthesia: the lesion was anesthetized in a standard fashion   Anesthetic:  1% lidocaine w/ epinephrine 1-100,000 local infiltration Curettage performed in three different directions: Yes   Curettage cycles:  3 Lesion length (cm):  2 Lesion width (cm):  2 Margin per side (cm):  0 Final wound size (cm):  2 Hemostasis achieved with:  ferric subsulfate Outcome: patient tolerated procedure well with no complications   Additional details:  Wound innoculated with 5 fluorouracil solution.      I, Lavonna Monarch, MD, have reviewed all documentation for this visit.  The documentation on 05/16/21 for the exam, diagnosis, procedures, and orders are all accurate and complete.

## 2021-06-12 DIAGNOSIS — R059 Cough, unspecified: Secondary | ICD-10-CM | POA: Diagnosis not present

## 2021-06-12 DIAGNOSIS — B349 Viral infection, unspecified: Secondary | ICD-10-CM | POA: Diagnosis not present

## 2021-06-12 DIAGNOSIS — J029 Acute pharyngitis, unspecified: Secondary | ICD-10-CM | POA: Diagnosis not present

## 2021-06-12 DIAGNOSIS — U071 COVID-19: Secondary | ICD-10-CM | POA: Diagnosis not present

## 2021-06-15 DIAGNOSIS — J029 Acute pharyngitis, unspecified: Secondary | ICD-10-CM | POA: Diagnosis not present

## 2021-06-21 ENCOUNTER — Encounter: Payer: Self-pay | Admitting: Internal Medicine

## 2021-06-29 ENCOUNTER — Other Ambulatory Visit: Payer: Self-pay | Admitting: Internal Medicine

## 2021-07-03 DIAGNOSIS — F411 Generalized anxiety disorder: Secondary | ICD-10-CM | POA: Diagnosis not present

## 2021-07-03 DIAGNOSIS — F3181 Bipolar II disorder: Secondary | ICD-10-CM | POA: Diagnosis not present

## 2021-07-19 ENCOUNTER — Other Ambulatory Visit: Payer: Self-pay | Admitting: Obstetrics & Gynecology

## 2021-07-19 NOTE — Telephone Encounter (Signed)
Annual exam scheduled on 08/14/21

## 2021-07-23 DIAGNOSIS — C44319 Basal cell carcinoma of skin of other parts of face: Secondary | ICD-10-CM | POA: Diagnosis not present

## 2021-08-09 DIAGNOSIS — H43813 Vitreous degeneration, bilateral: Secondary | ICD-10-CM | POA: Diagnosis not present

## 2021-08-09 DIAGNOSIS — H1045 Other chronic allergic conjunctivitis: Secondary | ICD-10-CM | POA: Diagnosis not present

## 2021-08-09 DIAGNOSIS — H25813 Combined forms of age-related cataract, bilateral: Secondary | ICD-10-CM | POA: Diagnosis not present

## 2021-08-09 DIAGNOSIS — H04123 Dry eye syndrome of bilateral lacrimal glands: Secondary | ICD-10-CM | POA: Diagnosis not present

## 2021-08-14 ENCOUNTER — Other Ambulatory Visit: Payer: Self-pay

## 2021-08-14 ENCOUNTER — Encounter: Payer: Self-pay | Admitting: Obstetrics & Gynecology

## 2021-08-14 ENCOUNTER — Telehealth: Payer: Self-pay | Admitting: *Deleted

## 2021-08-14 ENCOUNTER — Ambulatory Visit (INDEPENDENT_AMBULATORY_CARE_PROVIDER_SITE_OTHER): Payer: Medicare HMO | Admitting: Obstetrics & Gynecology

## 2021-08-14 VITALS — BP 114/72 | HR 75 | Resp 16 | Ht 66.0 in | Wt 128.0 lb

## 2021-08-14 DIAGNOSIS — Z9189 Other specified personal risk factors, not elsewhere classified: Secondary | ICD-10-CM

## 2021-08-14 DIAGNOSIS — Z01419 Encounter for gynecological examination (general) (routine) without abnormal findings: Secondary | ICD-10-CM

## 2021-08-14 DIAGNOSIS — M810 Age-related osteoporosis without current pathological fracture: Secondary | ICD-10-CM | POA: Diagnosis not present

## 2021-08-14 DIAGNOSIS — Z780 Asymptomatic menopausal state: Secondary | ICD-10-CM | POA: Diagnosis not present

## 2021-08-14 DIAGNOSIS — Z8619 Personal history of other infectious and parasitic diseases: Secondary | ICD-10-CM

## 2021-08-14 DIAGNOSIS — Z9289 Personal history of other medical treatment: Secondary | ICD-10-CM

## 2021-08-14 DIAGNOSIS — R102 Pelvic and perineal pain: Secondary | ICD-10-CM | POA: Diagnosis not present

## 2021-08-14 LAB — URINALYSIS, COMPLETE W/RFL CULTURE
Bacteria, UA: NONE SEEN /HPF
Bilirubin Urine: NEGATIVE
Glucose, UA: NEGATIVE
Hgb urine dipstick: NEGATIVE
Hyaline Cast: NONE SEEN /LPF
Ketones, ur: NEGATIVE
Leukocyte Esterase: NEGATIVE
Nitrites, Initial: NEGATIVE
Protein, ur: NEGATIVE
RBC / HPF: NONE SEEN /HPF (ref 0–2)
Specific Gravity, Urine: 1.005 (ref 1.001–1.035)
WBC, UA: NONE SEEN /HPF (ref 0–5)
pH: 6 (ref 5.0–8.0)

## 2021-08-14 LAB — NO CULTURE INDICATED

## 2021-08-14 MED ORDER — ALENDRONATE SODIUM 70 MG PO TABS: ORAL_TABLET | ORAL | 4 refills | Status: DC

## 2021-08-14 MED ORDER — ACYCLOVIR 5 % EX OINT: 1.0000 "application " | TOPICAL_OINTMENT | CUTANEOUS | 4 refills | Status: DC

## 2021-08-14 NOTE — Progress Notes (Signed)
Monique [name] Feb 04, 1956 [id number]   History:    65 y.o. G0 Married   RP:  Established patient presenting for annual gyn exam    HPI: Postmenopause, well on no HRT.  No PMB.  Occasional lower abdominal discomfort.  Tendency for constipation.  Urine normal.  Abstinent.  Pap Neg in 2019.  Breast normal.  Mammo 04/2021 Neg.  Osteoporosis on Fosamax, last BD T-Score -2.4 04/2021.  Rare genital HSV recurrences on Valacyclovir prophylaxis.  Body mass index 20.66.  In good fitness.  Health labs with family physician.  Colono 06/2020.  Mother with Colon Ca.   Past medical history,surgical history, family history and social history were all reviewed and documented in the EPIC chart.  Gynecologic History No LMP recorded. Patient is postmenopausal.  Obstetric History OB History  Gravida Para Term Preterm AB Living  0 0 0 0 0 0  SAB IAB Ectopic Multiple Live Births  0 0 0 0 0     ROS: A ROS was performed and pertinent positives and negatives are included in the history.  GENERAL: No fevers or chills. HEENT: No change in vision, no earache, sore throat or sinus congestion. NECK: No pain or stiffness. CARDIOVASCULAR: No chest pain or pressure. No palpitations. PULMONARY: No shortness of breath, cough or wheeze. GASTROINTESTINAL: No abdominal pain, nausea, vomiting or diarrhea, melena or bright red blood per rectum. GENITOURINARY: No urinary frequency, urgency, hesitancy or dysuria. MUSCULOSKELETAL: No joint or muscle pain, no back pain, no recent trauma. DERMATOLOGIC: No rash, no itching, no lesions. ENDOCRINE: No polyuria, polydipsia, no heat or cold intolerance. No recent change in weight. HEMATOLOGICAL: No anemia or easy bruising or bleeding. NEUROLOGIC: No headache, seizures, numbness, tingling or weakness. PSYCHIATRIC: No depression, no loss of interest in normal activity or change in sleep pattern.     Exam:   BP 114/72   Pulse 75   Resp 16   Ht 5\' 6"  (1.676 m)   Wt 128 lb (58.1  kg)   BMI 20.66 kg/m   Body mass index is 20.66 kg/m.  General appearance : Well developed well nourished female. No acute distress HEENT: Eyes: no retinal hemorrhage or exudates,  Neck supple, trachea midline, no carotid bruits, no thyroidmegaly Lungs: Clear to auscultation, no rhonchi or wheezes, or rib retractions  Heart: Regular rate and rhythm, no murmurs or gallops Breast:Examined in sitting and supine position were symmetrical in appearance, no palpable masses or tenderness,  no skin retraction, no nipple inversion, no nipple discharge, no skin discoloration, no axillary or supraclavicular lymphadenopathy Abdomen: no palpable masses or tenderness, no rebound or guarding Extremities: no edema or skin discoloration or tenderness  Pelvic: Vulva: Normal             Vagina: No gross lesions or discharge  Cervix: No gross lesions or discharge  Uterus  AV, normal size, shape and consistency, non-tender and mobile  Adnexa  Without masses or tenderness  Anus: Normal  U/A Negative   Assessment/Plan:  65 y.o. female for annual exam   1. Well female exam with routine gynecological exam  Postmenopause, well on no HRT.  No PMB.  Occasional lower abdominal discomfort.  Tendency for constipation.  Urine normal.  Abstinent.  Pap Neg in 2019.  Breast normal.  Mammo 04/2021 Neg.  Osteoporosis on Fosamax, last BD T-Score -2.4 04/2021.  Rare genital HSV recurrences on Valacyclovir prophylaxis.  Body mass index 20.66.  In good fitness.  Health labs with family physician.  Colono 06/2020.  Mother with Colon Ca.  2. Postmenopausal Postmenopause, well on no HRT.  No PMB.   3. Age-related osteoporosis without current pathological fracture Osteoporosis on Fosamax, last BD T-Score -2.4 04/2021, improved.  Will verify Vit D level and adjust supplement accordingly.  Ca++ 1.5 g/d total.  Regular wt bearing physical activities. - Vitamin D 1,25 dihydroxy  4. Pelvic pressure in female U/A Neg. -  Urinalysis,Complete w/RFL Culture  5. H/O herpes genitalis Valacyclovir and Zovirax as needed.  6. Other specified personal risk factors, not elsewhere classified  7. Personal history of other medical treatment  Other orders - QUEtiapine (SEROQUEL) 50 MG tablet - sertraline (ZOLOFT) 100 MG tablet - Ascorbic Acid (VITAMIN C PO); Take by mouth. - acyclovir ointment (ZOVIRAX) 5 %; Apply 1 application topically every 3 (three) hours. For recurrences of HSV. - alendronate (FOSAMAX) 70 MG tablet; Take with a full glass of water on an empty stomach.   Princess Bruins MD, 2:09 PM 08/14/2021

## 2021-08-14 NOTE — Telephone Encounter (Signed)
PA done via cover my meds for acyclovir 5% ointment, will wait for response for [facility] Medicare.

## 2021-08-17 LAB — VITAMIN D 1,25 DIHYDROXY
Vitamin D 1, 25 (OH)2 Total: 52 pg/mL (ref 18–72)
Vitamin D2 1, 25 (OH)2: <8 pg/mL
Vitamin D3 1, 25 (OH)2: 52 pg/mL

## 2021-08-20 NOTE — Telephone Encounter (Signed)
Per Dr. Dellis Filbert "Vit D continue same.  Ca++ total about 1200-1500 mg/day including nutrition and supplement. "

## 2021-08-29 DIAGNOSIS — Z48817 Encounter for surgical aftercare following surgery on the skin and subcutaneous tissue: Secondary | ICD-10-CM | POA: Diagnosis not present

## 2021-08-30 MED ORDER — VALACYCLOVIR HCL 1 G PO TABS: ORAL_TABLET | ORAL | 2 refills | Status: DC

## 2021-08-30 NOTE — Addendum Note (Signed)
Addended by: Thamas Jaegers on: 08/30/2021 02:11 PM   Modules accepted: Orders

## 2021-09-25 ENCOUNTER — Telehealth: Payer: Self-pay | Admitting: Internal Medicine

## 2021-09-25 NOTE — Telephone Encounter (Signed)
LVM for pt to rtn my call to schedule AWV with [facility]. Please schedule appt if pt calls the office.  

## 2021-10-29 ENCOUNTER — Ambulatory Visit: Payer: Medicare HMO | Admitting: Dermatology

## 2021-10-29 ENCOUNTER — Other Ambulatory Visit: Payer: Self-pay

## 2021-10-29 DIAGNOSIS — Z85828 Personal history of other malignant neoplasm of skin: Secondary | ICD-10-CM | POA: Diagnosis not present

## 2021-10-31 ENCOUNTER — Ambulatory Visit: Payer: Medicare HMO | Admitting: Adult Health

## 2021-10-31 ENCOUNTER — Other Ambulatory Visit: Payer: Self-pay

## 2021-11-12 ENCOUNTER — Encounter: Payer: Self-pay | Admitting: Adult Health

## 2021-11-12 ENCOUNTER — Ambulatory Visit: Payer: Medicare HMO | Admitting: Adult Health

## 2021-11-12 ENCOUNTER — Other Ambulatory Visit: Payer: Self-pay

## 2021-11-12 VITALS — BP 115/70 | HR 69 | Ht 67.0 in | Wt 129.0 lb

## 2021-11-12 DIAGNOSIS — M797 Fibromyalgia: Secondary | ICD-10-CM | POA: Insufficient documentation

## 2021-11-12 DIAGNOSIS — G4700 Insomnia, unspecified: Secondary | ICD-10-CM | POA: Diagnosis not present

## 2021-11-12 DIAGNOSIS — K219 Gastro-esophageal reflux disease without esophagitis: Secondary | ICD-10-CM | POA: Insufficient documentation

## 2021-11-12 DIAGNOSIS — F411 Generalized anxiety disorder: Secondary | ICD-10-CM | POA: Diagnosis not present

## 2021-11-12 DIAGNOSIS — F3181 Bipolar II disorder: Secondary | ICD-10-CM | POA: Diagnosis not present

## 2021-11-12 DIAGNOSIS — F331 Major depressive disorder, recurrent, moderate: Secondary | ICD-10-CM | POA: Diagnosis not present

## 2021-11-12 DIAGNOSIS — J3081 Allergic rhinitis due to animal (cat) (dog) hair and dander: Secondary | ICD-10-CM | POA: Insufficient documentation

## 2021-11-12 DIAGNOSIS — K6389 Other specified diseases of intestine: Secondary | ICD-10-CM | POA: Insufficient documentation

## 2021-11-12 DIAGNOSIS — M810 Age-related osteoporosis without current pathological fracture: Secondary | ICD-10-CM | POA: Insufficient documentation

## 2021-11-12 MED ORDER — QUETIAPINE FUMARATE 50 MG PO TABS: 50.0000 mg | ORAL_TABLET | Freq: Every day | ORAL | 3 refills | Status: DC

## 2021-11-12 MED ORDER — LORAZEPAM 1 MG PO TABS: 1.0000 mg | ORAL_TABLET | Freq: Every day | ORAL | 2 refills | Status: DC

## 2021-11-12 MED ORDER — LAMOTRIGINE 100 MG PO TABS: ORAL_TABLET | ORAL | 3 refills | Status: DC

## 2021-11-12 MED ORDER — SERTRALINE HCL 100 MG PO TABS: 100.0000 mg | ORAL_TABLET | Freq: Every day | ORAL | 3 refills | Status: DC

## 2021-11-12 NOTE — Progress Notes (Signed)
[facility] MD/PA/NP Initial Note  11/12/2021 3:58 PM Debany H. Pongratz  MRN:  [id number]  Chief Complaint:   HPI:  Describes mood today as "ok" Pleasant. Tearful at times. Mood symptoms - reports depression and anxiety - more "worried" - nephew and brother with psychiatric illnesses - "I am worried about them". Sister past away in September. Denies irritability. Reports increased stressors within family. Has been followed by Pauline Good, FNP, for psychiatric medication management. She is now on Medicare and is seeking a provider for continuation of current medication - Zoloft 100mg  daily, Lamictal 50mg  at hs, Seroquel 50mg  at bedtime, and Ativan 1.5mg  daily as needed. Stable interest and motivation. Taking medications as prescribed.  Energy levels "bad for the past 3 to 4 months". Plans to notify her PCP for a check up. Active, does not have a regular exercise routine. Enjoys some usual interests and activities. Married. Lives with husband of 44 years. Family local. Spending time with family. Appetite adequate. Weight stable - 129 pounds - 67". Sleeps well most nights. Averages 10 hours. Focus and concentration "not that great". Has periods of hyper-focusing. Completing tasks. Managing aspects of household. Semi-retired.  Denies SI or HI.  Denies AH or VH.  Previous medication trials:  Denies  Visit Diagnosis:    ICD-10-CM   1. Insomnia, unspecified type  G47.00 QUEtiapine (SEROQUEL) 50 MG tablet    2. Generalized anxiety disorder  F41.1 LORazepam (ATIVAN) 1 MG tablet    sertraline (ZOLOFT) 100 MG tablet    3. Major depressive disorder, recurrent episode, moderate (HCC)  F33.1     4. Bipolar II disorder (HCC)  F31.81 lamoTRIgine (LAMICTAL) 100 MG tablet      Past Psychiatric History: Denies psychiatric hospitalization.   Past Medical History:  Past Medical History:  Diagnosis Date   Abnormal electrocardiogram    Allergic rhinitis    Allergy    seasonal allergies   Anxiety     on meds   Arthritis    R hip and left thumb   Bipolar disorder (HCC)    Cancer (HCC)    basal skin cell CA-1979   COVID    Depression    some- on meds   GERD (gastroesophageal reflux disease)    not on meds at this time   History of hepatitis C    Hep B also   History of skin cancer 1980   HSV infection    Memory loss    MVP (mitral valve prolapse)    Osteoporosis    Paresthesia    Unspecified chronic bronchitis ([place])     Past Surgical History:  Procedure Laterality Date   APPENDECTOMY  11/08   COLONOSCOPY  2014   MOHS SURGERY     RHINOPLASTY     TUBAL LIGATION     bilateral    Family Psychiatric History: Family history of mental illness.   Family History:  Family History  Problem Relation Age of Onset   COPD Mother    Allergies Mother    Heart disease Mother    Atrial fibrillation Mother    Heart failure Mother    Colon cancer Mother 95       Had 6 inches, resected.   Colon polyps Mother 64   Allergies Father    Rheum arthritis Father    Bladder Cancer Father    Prostate cancer Father 40   Cancer - Other Father 8       Bladder   Colon polyps Brother 27  COPD Paternal Uncle    Pancreatic cancer Paternal Grandmother 17   Asthma Other    Esophageal cancer Neg Hx    Stomach cancer Neg Hx    Rectal cancer Neg Hx     Social History:  Social History   Socioeconomic History   Marital status: Married    Spouse name: Not on file   Number of children: Not on file   Years of education: Not on file   Highest education level: Not on file  Occupational History   Occupation: SITTER    Employer: SELF EMPLOYED    Comment: parent caregiver  Tobacco Use   Smoking status: Former    Packs/day: 1.50    Years: 25.00    Pack years: 37.50    Types: Cigarettes    Quit date: 09/30/1996    Years since quitting: 25.1   Smokeless tobacco: Never  Vaping Use   Vaping Use: Never used  Substance and Sexual Activity   Alcohol use: Yes    Comment: 2 a week   Drug  use: No   Sexual activity: Not Currently    Partners: Male    Birth control/protection: Post-menopausal    Comment: 1st intercourse- 16, partners- 12, married- 108 yrs   Other Topics Concern   Not on file  Social History Narrative   1 cup caffeine daily   Married   No children   Daughter of marjorie herndon   Social Determinants of Health   Financial Resource Strain: Not on file  Food Insecurity: Not on file  Transportation Needs: Not on file  Physical Activity: Not on file  Stress: Not on file  Social Connections: Not on file    Allergies: No Known Allergies  Metabolic Disorder Labs: Lab Results  Component Value Date   HGBA1C 5.3 04/06/2019   No results found for: PROLACTIN Lab Results  Component Value Date   CHOL 169 04/27/2020   TRIG 123 04/27/2020   HDL 78 04/27/2020   CHOLHDL 2.2 04/27/2020   VLDL 16.6 04/06/2019   LDLCALC 71 04/27/2020   LDLCALC 67 04/06/2019   Lab Results  Component Value Date   TSH 2.63 04/06/2019   TSH 3.67 12/24/2016    Therapeutic Level Labs: No results found for: LITHIUM No results found for: VALPROATE No components found for:  CBMZ  Current Medications: Current Outpatient Medications  Medication Sig Dispense Refill   acyclovir ointment (ZOVIRAX) 5 % Apply 1 application topically every 3 (three) hours. For recurrences of HSV. 15 g 4   alendronate (FOSAMAX) 70 MG tablet Take with a full glass of water on an empty stomach. 12 tablet 4   Ascorbic Acid (VITAMIN C PO) Take by mouth.     azelastine (ASTELIN) 0.1 % nasal spray SMARTSIG:1-2 Spray(s) Both Nares Twice Daily PRN     Biotin w/ Vitamins C & E (HAIR/SKIN/NAILS PO) Take by mouth.     Calcium Carbonate-Vit D-Min (CALCIUM 1200 PO) Take by mouth. Take 1 tab once daily.     lamoTRIgine (LAMICTAL) 100 MG tablet Take 1/2 tablet for 50 mg daily. 90 tablet 3   levocetirizine (XYZAL) 5 MG tablet SMARTSIG:1 Tablet(s) By Mouth Every Evening     LORazepam (ATIVAN) 1 MG tablet Take 1-2  tablets (1-2 mg total) by mouth at bedtime. 60 tablet 2   metoprolol tartrate (LOPRESSOR) 50 MG tablet Take 1 tablet (50 mg total) by mouth 2 (two) times daily. (Patient taking differently: Take 50 mg by mouth as needed.) 180 tablet  3   QUEtiapine (SEROQUEL) 50 MG tablet Take 1 tablet (50 mg total) by mouth at bedtime. 90 tablet 3   sertraline (ZOLOFT) 100 MG tablet Take 1 tablet (100 mg total) by mouth daily. 90 tablet 3   sertraline (ZOLOFT) 50 MG tablet Take 50 mg by mouth daily. Take 1 tabv by mouth every morning.     sodium chloride (OCEAN) 0.65 % SOLN nasal spray Place 1 spray into both nostrils as needed for congestion.     traMADol (ULTRAM) 50 MG tablet TAKE 1 TABLET BY MOUTH DAILY AS NEEDED 15 tablet 3   valACYclovir (VALTREX) 1000 MG tablet TAKE 1 TABLET(1000 MG) BY MOUTH DAILY 30 tablet 2   No current facility-administered medications for this visit.    Medication Side Effects: none  Orders placed this visit:  No orders of the defined types were placed in this encounter.   Psychiatric Specialty Exam:  Review of Systems  Musculoskeletal:  Negative for gait problem.  Neurological:  Negative for tremors.  Psychiatric/Behavioral:         Please refer to HPI   Blood pressure 115/70, pulse 69, height 5\' 7"  (1.702 m), weight 129 lb (58.5 kg).Body mass index is 20.2 kg/m.  General Appearance: Casual and Neat  Eye Contact:  Good  Speech:  Clear and Coherent and Normal Rate  Volume:  Normal  Mood:  Euthymic  Affect:  Appropriate and Congruent  Thought Process:  Coherent and Descriptions of Associations: Intact  Orientation:  Full (Time, Place, and Person)  Thought Content: Logical   Suicidal Thoughts:  No  Homicidal Thoughts:  No  Memory:  WNL  Judgement:  Good  Insight:  Good  Psychomotor Activity:  Normal  Concentration:  Concentration: Good  Recall:  Good  Fund of Knowledge: Good  Language: Good  Assets:  Communication Skills Desire for Improvement Financial  Resources/Insurance Housing Intimacy Leisure Time Physical Health Resilience Social Support Talents/Skills Transportation Vocational/Educational  ADL's:  Intact  Cognition: WNL  Prognosis:  Good   Screenings:  GAD-7    Flowsheet Row Office Visit from 04/06/2019 in [place]  Total GAD-7 Score 8      PHQ2-9    [place] Office Visit from 04/27/2020 in [place] at [place] Visit from 04/06/2019 in [place] from 10/09/2017 in [facility] [place] from 09/25/2016 in [facility] at [facility] Total Score 2 2 0 0  PHQ-9 Total Score 7 9 -- --       Receiving Psychotherapy: Yes   Treatment Plan/Recommendations:  Plan:  PDMP reviewed  1. Lamictal 100mg  daily - take 1/2 tablet daily  2. Ativan 1mg  - take one and 1/2 tabs daily as needed 3. Zoloft 100mg  daily 4. Seroquel 50mg  at hs  Patient seen for 60 minutes and time spent discussing treatment options.  Counseled patient regarding potential benefits, risks, and side effects of Lamictal to include potential risk of Stevens-Johnson syndrome. Advised patient to stop taking Lamictal and contact office immediately if rash develops and to seek urgent medical attention if rash is severe and/or spreading quickly.   Discussed potential benefits, risk, and side effects of benzodiazepines to include potential risk of tolerance and dependence, as well as possible drowsiness.  Advised patient not to drive if experiencing drowsiness and to take lowest possible effective dose to minimize risk of dependence and tolerance.   Discussed potential metabolic side effects associated with atypical  antipsychotics, as well as potential risk for movement side effects. Advised pt to contact office if movement side effects occur.    RTC 4 weeks  Patient advised to contact office with any questions, adverse effects, or acute  worsening in signs and symptoms.       Aloha Gell, NP

## 2021-11-18 ENCOUNTER — Encounter: Payer: Self-pay | Admitting: Dermatology

## 2021-11-18 NOTE — Progress Notes (Signed)
° °  Follow-Up Visit   Subjective  Monique [name] is a 66 y.o. female who presents for the following: Follow-up (Pt here for recheck of the forehead and Right thigh. Pt denies any discomfort. Pt would like to discuss tx for facial sun damage).  Check surgical sites at other spots Location:  Duration:  Quality:  Associated Signs/Symptoms: Modifying Factors:  Severity:  Timing: Context:   Objective  Well appearing patient in no apparent distress; mood and affect are within normal limits. Head - Anterior (Face) No sign residual cancer forehead (Mohs surgery by Dr. Link Snuffer) or leg.  Currently no other nonmelanoma skin cancers or atypical pigmented lesions.    A focused examination was performed including head, neck, back, arms, legs. Relevant physical exam findings are noted in the Assessment and Plan.   Assessment & Plan    Personal history of skin cancer Head - Anterior (Face)  Annual skin examination.  Encouraged to self examine with spouse twice annually.  Recheck surgical sites as needed change.      I, Lavonna Monarch, MD, have reviewed all documentation for this visit.  The documentation on 11/18/21 for the exam, diagnosis, procedures, and orders are all accurate and complete.

## 2021-12-24 ENCOUNTER — Encounter: Payer: Self-pay | Admitting: Internal Medicine

## 2022-01-01 ENCOUNTER — Encounter: Payer: Medicare HMO | Admitting: Internal Medicine

## 2022-01-14 ENCOUNTER — Ambulatory Visit (INDEPENDENT_AMBULATORY_CARE_PROVIDER_SITE_OTHER): Payer: Medicare HMO

## 2022-01-14 ENCOUNTER — Ambulatory Visit (INDEPENDENT_AMBULATORY_CARE_PROVIDER_SITE_OTHER): Payer: Medicare HMO | Admitting: Internal Medicine

## 2022-01-14 ENCOUNTER — Encounter: Payer: Self-pay | Admitting: Internal Medicine

## 2022-01-14 VITALS — BP 116/60 | HR 54 | Resp 18 | Ht 67.0 in | Wt 128.6 lb

## 2022-01-14 DIAGNOSIS — Z13220 Encounter for screening for lipoid disorders: Secondary | ICD-10-CM

## 2022-01-14 DIAGNOSIS — M810 Age-related osteoporosis without current pathological fracture: Secondary | ICD-10-CM

## 2022-01-14 DIAGNOSIS — R5383 Other fatigue: Secondary | ICD-10-CM

## 2022-01-14 DIAGNOSIS — R059 Cough, unspecified: Secondary | ICD-10-CM | POA: Diagnosis not present

## 2022-01-14 DIAGNOSIS — M159 Polyosteoarthritis, unspecified: Secondary | ICD-10-CM | POA: Diagnosis not present

## 2022-01-14 DIAGNOSIS — F319 Bipolar disorder, unspecified: Secondary | ICD-10-CM | POA: Diagnosis not present

## 2022-01-14 DIAGNOSIS — Z0000 Encounter for general adult medical examination without abnormal findings: Secondary | ICD-10-CM

## 2022-01-14 DIAGNOSIS — Z833 Family history of diabetes mellitus: Secondary | ICD-10-CM | POA: Diagnosis not present

## 2022-01-14 LAB — COMPREHENSIVE METABOLIC PANEL
ALT: 11 U/L (ref 0–35)
AST: 18 U/L (ref 0–37)
Albumin: 4.6 g/dL (ref 3.5–5.2)
Alkaline Phosphatase: 60 U/L (ref 39–117)
BUN: 14 mg/dL (ref 6–23)
CO2: 28 mEq/L (ref 19–32)
Calcium: 9.4 mg/dL (ref 8.4–10.5)
Chloride: 102 mEq/L (ref 96–112)
Creatinine, Ser: 0.79 mg/dL (ref 0.40–1.20)
GFR: 78.01 mL/min (ref >60.00)
Glucose, Bld: 87 mg/dL (ref 70–99)
Potassium: 4.7 mEq/L (ref 3.5–5.1)
Sodium: 138 mEq/L (ref 135–145)
Total Bilirubin: 0.4 mg/dL (ref 0.2–1.2)
Total Protein: 7.7 g/dL (ref 6.0–8.3)

## 2022-01-14 LAB — LIPID PANEL
Cholesterol: 208 mg/dL — ABNORMAL HIGH (ref 0–200)
HDL: 75.9 mg/dL (ref >39.00)
LDL Cholesterol: 108 mg/dL — ABNORMAL HIGH (ref 0–99)
NonHDL: 131.99
Total CHOL/HDL Ratio: 3
Triglycerides: 120 mg/dL (ref 0.0–149.0)
VLDL: 24 mg/dL (ref 0.0–40.0)

## 2022-01-14 LAB — CBC
HCT: 38.7 % (ref 36.0–46.0)
Hemoglobin: 13 g/dL (ref 12.0–15.0)
MCHC: 33.5 g/dL (ref 30.0–36.0)
MCV: 94.2 fl (ref 78.0–100.0)
Platelets: 220 10*3/uL (ref 150.0–400.0)
RBC: 4.11 Mil/uL (ref 3.87–5.11)
RDW: 13.9 % (ref 11.5–15.5)
WBC: 5.5 10*3/uL (ref 4.0–10.5)

## 2022-01-14 LAB — HEMOGLOBIN A1C: Hgb A1c MFr Bld: 5.6 % (ref 4.6–6.5)

## 2022-01-14 LAB — TSH: TSH: 2.3 u[IU]/mL (ref 0.35–5.50)

## 2022-01-14 LAB — VITAMIN B12: Vitamin B-12: 473 pg/mL (ref 211–911)

## 2022-01-14 LAB — VITAMIN D 25 HYDROXY (VIT D DEFICIENCY, FRACTURES): VITD: 34.57 ng/mL (ref 30.00–100.00)

## 2022-01-14 NOTE — Progress Notes (Signed)
? ?Subjective:  ? ?Patient ID: Monique [name]. Cannan, female    DOB: Feb 09, 1956, 66 y.o.   MRN: [id number] ? ?HPI ?Here for medicare wellness and physical, with new complaints. Please see A/P for status and treatment of chronic medical problems.  ? ?Diet: heart healthy  ?Physical activity: sedentary ?Depression/mood screen: negative ?Hearing: intact to whispered voice, moderate loss bilaterally ?Visual acuity: grossly normal with lens, performs annual eye exam  ?ADLs: capable ?Fall risk: none ?Home safety: good ?Cognitive evaluation: intact to orientation, naming, recall and repetition ?EOL planning: adv directives discussed, not in place ? ?[place] Office Visit from 04/27/2020 in [place] at [place]  ?PHQ-2 Total Score 2  ? ?  ?  ?[place] Office Visit from 04/27/2020 in [place] at [place]  ?PHQ-9 Total Score 7  ? ?  ? ? ?  09/25/2016  ?  3:01 PM 10/09/2017  ?  2:01 PM 06/28/2020  ?  1:17 PM 01/14/2022  ?  2:34 PM  ?Fall Risk  ?Falls in the past year? Yes No  0  ?Was there an injury with Fall? Yes   0  ?Fall Risk Category Calculator    0  ?Fall Risk Category    Low  ?Patient Fall Risk Level   Low fall risk   ? ? ?I have personally reviewed and have noted ?1. The patient's medical and social history - reviewed today no changes ?2. Their use of alcohol, tobacco or illicit drugs ?3. Their current medications and supplements ?4. The patient's functional ability including ADL's, fall risks, home safety risks and hearing or visual impairment. ?5. Diet and physical activities ?6. Evidence for depression or mood disorders ?7. Care team reviewed and updated ?8.  The patient is is on an opioid pain medication tramadol which she takes 1 pill a few times a week and this was reviewed with patient and non-opioid pain medication options were reviewed and offered to patient, their pain treatment plan and severity was discussed with them. We have considered referrals as appropriate for patient.  Opioid risk factors were also considered and reviewed.  ? ?Patient Care Team: ?Hoyt Koch, MD as PCP - General (Internal Medicine) ?Lavonna Monarch, MD as Consulting Physician (Dermatology) ?Past Medical History:  ?Diagnosis Date  ? Abnormal electrocardiogram   ? Allergic rhinitis   ? Allergy   ? seasonal allergies  ? Anxiety   ? on meds  ? Arthritis   ? R hip and left thumb  ? Bipolar disorder ([place])   ? Cancer [facility])   ? basal skin cell CA-1979  ? COVID   ? Depression   ? some- on meds  ? GERD (gastroesophageal reflux disease)   ? not on meds at this time  ? History of hepatitis C   ? Hep B also  ? History of skin cancer 1980  ? HSV infection   ? Memory loss   ? MVP (mitral valve prolapse)   ? Osteoporosis   ? Paresthesia   ? Unspecified chronic bronchitis ([place])   ? ?Past Surgical History:  ?Procedure Laterality Date  ? APPENDECTOMY  11/08  ? COLONOSCOPY  2014  ? MOHS SURGERY    ? RHINOPLASTY    ? TUBAL LIGATION    ? bilateral  ? ?Family History  ?Problem Relation Age of Onset  ? COPD Mother   ? Allergies Mother   ? Heart disease Mother   ? Atrial fibrillation Mother   ? Heart failure Mother   ?  Colon cancer Mother 74  ?     Had 6 inches, resected.  ? Colon polyps Mother 58  ? Allergies Father   ? Rheum arthritis Father   ? Bladder Cancer Father   ? Prostate cancer Father 31  ? Cancer - Other Father 40  ?     Bladder  ? Colon polyps Brother 73  ? COPD Paternal Uncle   ? Pancreatic cancer Paternal Grandmother 68  ? Asthma Other   ? Esophageal cancer Neg Hx   ? Stomach cancer Neg Hx   ? Rectal cancer Neg Hx   ? ?Review of Systems  ?Constitutional:  Positive for activity change and fatigue.  ?HENT: Negative.    ?Eyes: Negative.   ?Respiratory:  Positive for cough. Negative for chest tightness and shortness of breath.   ?Cardiovascular:  Negative for chest pain, palpitations and leg swelling.  ?Gastrointestinal:  Negative for abdominal distention, abdominal pain, constipation, diarrhea, nausea and vomiting.   ?Musculoskeletal:  Positive for arthralgias.  ?Skin: Negative.   ?Neurological: Negative.   ?Psychiatric/Behavioral: Negative.    ? ?Objective:  ?Physical Exam ?Constitutional:   ?   Appearance: She is well-developed.  ?HENT:  ?   Head: Normocephalic and atraumatic.  ?Cardiovascular:  ?   Rate and Rhythm: Normal rate and regular rhythm.  ?Pulmonary:  ?   Effort: Pulmonary effort is normal. No respiratory distress.  ?   Breath sounds: Normal breath sounds. No wheezing or rales.  ?Abdominal:  ?   General: Bowel sounds are normal. There is no distension.  ?   Palpations: Abdomen is soft.  ?   Tenderness: There is no abdominal tenderness. There is no rebound.  ?Musculoskeletal:  ?   Cervical back: Normal range of motion.  ?Skin: ?   General: Skin is warm and dry.  ?Neurological:  ?   Mental Status: She is alert and oriented to person, place, and time.  ?   Coordination: Coordination normal.  ? ? ?Vitals:  ? 01/14/22 1431  ?BP: 116/60  ?Pulse: (!) 54  ?Resp: 18  ?SpO2: 99%  ?Weight: 128 lb 9.6 oz (58.3 kg)  ?Height: '5\' 7"'$  (1.702 m)  ? ?This visit occurred during the SARS-CoV-2 public health emergency.  Safety protocols were in place, including screening questions prior to the visit, additional usage of staff PPE, and extensive cleaning of exam room while observing appropriate contact time as indicated for disinfecting solutions.  ? ?Assessment & Plan:  ? ?

## 2022-01-14 NOTE — Patient Instructions (Addendum)
We will check the labs and chest x-ray. ? ? ?

## 2022-01-15 ENCOUNTER — Encounter: Payer: Self-pay | Admitting: Internal Medicine

## 2022-01-16 ENCOUNTER — Other Ambulatory Visit: Payer: Self-pay | Admitting: Internal Medicine

## 2022-01-16 DIAGNOSIS — R9389 Abnormal findings on diagnostic imaging of other specified body structures: Secondary | ICD-10-CM

## 2022-01-16 MED ORDER — AZITHROMYCIN 250 MG PO TABS: ORAL_TABLET | ORAL | 0 refills | Status: AC

## 2022-01-17 NOTE — Assessment & Plan Note (Signed)
Takes tramadol every other day as needed 50 mg. [place] narcotic database reviewed and appropriate. Will refill #15 per month as needed.  ?

## 2022-01-17 NOTE — Assessment & Plan Note (Signed)
Overall controlled and seeing behavioral health for medication management.  ?

## 2022-01-17 NOTE — Assessment & Plan Note (Signed)
Checking HgA1c for screening. She is having some new fatigue which could be related.  ?

## 2022-01-17 NOTE — Assessment & Plan Note (Signed)
Flu shot yearly. Covid-19 counseled. Pneumonia complete. Shingrix took 1 dose and did not complete declines completion. Tetanus due 2025. Colonoscopy due 2026. Mammogram due 2024, pap smear aged out and dexa due 2024. Counseled about sun safety and mole surveillance. Counseled about the dangers of distracted driving. Given 10 year screening recommendations.  ? ?

## 2022-01-17 NOTE — Assessment & Plan Note (Signed)
New problem and checking CBC, CMP, B12, vitamin D, TSH, HgA1c. Adjust as needed.  ?

## 2022-01-17 NOTE — Assessment & Plan Note (Signed)
Checking CMP and vitamin D to assess calcium and vitamin D levels. Taking fosamax and adjust as needed. Will follow next DEXA 2024. ?

## 2022-01-25 ENCOUNTER — Other Ambulatory Visit: Payer: Self-pay | Admitting: Internal Medicine

## 2022-01-29 ENCOUNTER — Encounter: Payer: Self-pay | Admitting: Adult Health

## 2022-01-29 ENCOUNTER — Other Ambulatory Visit: Payer: Self-pay | Admitting: Internal Medicine

## 2022-01-29 ENCOUNTER — Ambulatory Visit (INDEPENDENT_AMBULATORY_CARE_PROVIDER_SITE_OTHER): Payer: Medicare HMO | Admitting: Adult Health

## 2022-01-29 DIAGNOSIS — F411 Generalized anxiety disorder: Secondary | ICD-10-CM | POA: Diagnosis not present

## 2022-01-29 DIAGNOSIS — F331 Major depressive disorder, recurrent, moderate: Secondary | ICD-10-CM | POA: Diagnosis not present

## 2022-01-29 DIAGNOSIS — F3181 Bipolar II disorder: Secondary | ICD-10-CM

## 2022-01-29 DIAGNOSIS — G4700 Insomnia, unspecified: Secondary | ICD-10-CM | POA: Diagnosis not present

## 2022-01-29 MED ORDER — LAMOTRIGINE 100 MG PO TABS: ORAL_TABLET | ORAL | 3 refills | Status: DC

## 2022-01-29 MED ORDER — QUETIAPINE FUMARATE 25 MG PO TABS: 25.0000 mg | ORAL_TABLET | Freq: Every day | ORAL | 1 refills | Status: DC

## 2022-01-29 NOTE — Progress Notes (Signed)
Monique [name] ?[id number] ?05-10-1956 ?66 y.o. ? ?Subjective:  ? ?Patient ID:  Monique [name]. Neighbors is a 66 y.o. (DOB 1955/10/21) female. ? ?Chief Complaint: No chief complaint on file. ? ? ?HPI ?Monique [name] presents to the office today for follow-up of MDD, GAD, insomnia and BPD2. ? ?Describes mood today as "ok" Pleasant. Tearful at times. Mood symptoms - reports some anxiety - "not too bad". Feels depressed at times. Denies irritability. Reports some worry and rumination - over thinking. Stating "I'm doing alright". Reports family stressors. Stable interest and motivation. Taking medications as prescribed.  ?Energy levels lower. Active, does not have a regular exercise routine. ?Enjoys some usual interests and activities. Married. Lives with husband. Family local. Spending time with family. ?Appetite adequate. Weight stable - 129 pounds - 67". ?Sleeps well most nights. Averages 10 hours. ?Focus and concentration varies. Completing tasks. Managing aspects of household. Semi-retired.  ?Denies SI or HI.  ?Denies AH or VH. ? ?Previous medication trials:  Denies ? ? ?GAD-7   ? ?[place] Office Visit from 04/06/2019 in [place]  ?Total GAD-7 Score 8  ? ?  ? ?PHQ2-9   ? ?[place] Office Visit from 04/27/2020 in [place] at [facility] Visit from 04/06/2019 in [place] Visit from 10/09/2017 in [place] Visit from 09/25/2016 in [place] at [place]  ?PHQ-2 Total Score 2 2 0 0  ?PHQ-9 Total Score 7 9 -- --  ? ?  ?  ? ?Review of Systems:  ?Review of Systems  ?Musculoskeletal:  Negative for gait problem.  ?Neurological:  Negative for tremors.  ?Psychiatric/Behavioral:    ?     Please refer to HPI  ? ?Medications: I have reviewed the patient's current medications. ? ?Current Outpatient Medications  ?Medication Sig Dispense Refill  ? acyclovir ointment (ZOVIRAX) 5 % Apply 1 application topically every  3 (three) hours. For recurrences of HSV. 15 g 4  ? alendronate (FOSAMAX) 70 MG tablet Take with a full glass of water on an empty stomach. 12 tablet 4  ? Ascorbic Acid (VITAMIN C PO) Take by mouth.    ? azelastine (ASTELIN) 0.1 % nasal spray SMARTSIG:1-2 Spray(s) Both Nares Twice Daily PRN    ? Biotin w/ Vitamins C & E (HAIR/SKIN/NAILS PO) Take by mouth.    ? Calcium Carbonate-Vit D-Min (CALCIUM 1200 PO) Take by mouth. Take 1 tab once daily.    ? lamoTRIgine (LAMICTAL) 100 MG tablet Take one tablet at bedtime. 90 tablet 3  ? levocetirizine (XYZAL) 5 MG tablet SMARTSIG:1 Tablet(s) By Mouth Every Evening    ? LORazepam (ATIVAN) 1 MG tablet Take 1-2 tablets (1-2 mg total) by mouth at bedtime. 60 tablet 2  ? metoprolol tartrate (LOPRESSOR) 50 MG tablet Take 1 tablet (50 mg total) by mouth 2 (two) times daily. (Patient taking differently: Take 50 mg by mouth as needed.) 180 tablet 3  ? QUEtiapine (SEROQUEL) 25 MG tablet Take 1 tablet (25 mg total) by mouth at bedtime. 90 tablet 1  ? sertraline (ZOLOFT) 100 MG tablet Take 1 tablet (100 mg total) by mouth daily. 90 tablet 3  ? sodium chloride (OCEAN) 0.65 % SOLN nasal spray Place 1 spray into both nostrils as needed for congestion.    ? traMADol (ULTRAM) 50 MG tablet TAKE 1 TABLET BY MOUTH DAILY AS NEEDED 15 tablet 3  ? valACYclovir (VALTREX) 1000 MG tablet TAKE 1 TABLET(1000 MG) BY  MOUTH DAILY 30 tablet 2  ? ?No current facility-administered medications for this visit.  ? ? ?Medication Side Effects: None ? ?Allergies: No Known Allergies ? ?Past Medical History:  ?Diagnosis Date  ? Abnormal electrocardiogram   ? Allergic rhinitis   ? Allergy   ? seasonal allergies  ? Anxiety   ? on meds  ? Arthritis   ? R hip and left thumb  ? Bipolar disorder ([place])   ? Cancer [facility])   ? basal skin cell CA-1979  ? COVID   ? Depression   ? some- on meds  ? GERD (gastroesophageal reflux disease)   ? not on meds at this time  ? History of hepatitis C   ? Hep B also  ? History of skin cancer  1980  ? HSV infection   ? Memory loss   ? MVP (mitral valve prolapse)   ? Osteoporosis   ? Paresthesia   ? Unspecified chronic bronchitis ([place])   ? ? ?Past Medical History, Surgical history, Social history, and Family history were reviewed and updated as appropriate.  ? ?Please see review of systems for further details on the patient's review from today.  ? ?Objective:  ? ?Physical Exam:  ?There were no vitals taken for this visit. ? ?Physical Exam ?Constitutional:   ?   General: She is not in acute distress. ?Musculoskeletal:     ?   General: No deformity.  ?Neurological:  ?   Mental Status: She is alert and oriented to person, place, and time.  ?   Coordination: Coordination normal.  ?Psychiatric:     ?   Attention and Perception: Attention and perception normal. She does not perceive auditory or visual hallucinations.     ?   Mood and Affect: Mood normal. Mood is not anxious or depressed. Affect is not labile, blunt, angry or inappropriate.     ?   Speech: Speech normal.     ?   Behavior: Behavior normal.     ?   Thought Content: Thought content normal. Thought content is not paranoid or delusional. Thought content does not include homicidal or suicidal ideation. Thought content does not include homicidal or suicidal plan.     ?   Cognition and Memory: Cognition and memory normal.     ?   Judgment: Judgment normal.  ?   Comments: Insight intact  ? ? ?Lab Review:  ?   ?Component Value Date/Time  ? NA 138 01/14/2022 1522  ? K 4.7 01/14/2022 1522  ? CL 102 01/14/2022 1522  ? CO2 28 01/14/2022 1522  ? GLUCOSE 87 01/14/2022 1522  ? BUN 14 01/14/2022 1522  ? CREATININE 0.79 01/14/2022 1522  ? CREATININE 0.76 04/27/2020 1446  ? CALCIUM 9.4 01/14/2022 1522  ? PROT 7.7 01/14/2022 1522  ? ALBUMIN 4.6 01/14/2022 1522  ? AST 18 01/14/2022 1522  ? ALT 11 01/14/2022 1522  ? ALKPHOS 60 01/14/2022 1522  ? BILITOT 0.4 01/14/2022 1522  ? GFRNONAA 92.72 08/17/2009 1022  ? GFRAA 113 08/03/2007 1638  ? ? ?   ?Component Value  Date/Time  ? WBC 5.5 01/14/2022 1522  ? RBC 4.11 01/14/2022 1522  ? HGB 13.0 01/14/2022 1522  ? HCT 38.7 01/14/2022 1522  ? PLT 220.0 01/14/2022 1522  ? MCV 94.2 01/14/2022 1522  ? MCH 31.6 04/27/2020 1446  ? MCHC 33.5 01/14/2022 1522  ? RDW 13.9 01/14/2022 1522  ? LYMPHSABS 1.5 07/29/2014 1117  ? MONOABS 0.5 07/29/2014 1117  ?  EOSABS 0.1 07/29/2014 1117  ? BASOSABS 0.0 07/29/2014 1117  ? ? ?No results found for: POCLITH, LITHIUM  ? ?No results found for: PHENYTOIN, PHENOBARB, VALPROATE, CBMZ  ? ?.res ?Assessment: Plan:   ? ?Plan: ? ?PDMP reviewed ? ?1. Decrease Lamictal '50mg'$  to '25mg'$  at hs  ?2. Ativan '1mg'$  - take one and 1/2 tabs daily as needed ?3. Zoloft '100mg'$  daily ?4. Decrease Seroquel '50mg'$  to '25mg'$  at hs ? ? ?Time spent with patient was 25 minutes. Greater than 50% of face to face time with patient was spent on counseling and coordination of care.   ? ?Counseled patient regarding potential benefits, risks, and side effects of Lamictal to include potential risk of Stevens-Johnson syndrome. Advised patient to stop taking Lamictal and contact office immediately if rash develops and to seek urgent medical attention if rash is severe and/or spreading quickly.  ? ?Discussed potential benefits, risk, and side effects of benzodiazepines to include potential risk of tolerance and dependence, as well as possible drowsiness.  Advised patient not to drive if experiencing drowsiness and to take lowest possible effective dose to minimize risk of dependence and tolerance.  ? ?Discussed potential metabolic side effects associated with atypical antipsychotics, as well as potential risk for movement side effects. Advised pt to contact office if movement side effects occur.   ? ?RTC 4 weeks ? ?Patient advised to contact office with any questions, adverse effects, or acute worsening in signs and symptoms. ? ?Diagnoses and all orders for this visit: ? ?Insomnia, unspecified type ?-     QUEtiapine (SEROQUEL) 25 MG tablet; Take 1  tablet (25 mg total) by mouth at bedtime. ? ?Generalized anxiety disorder ? ?Major depressive disorder, recurrent episode, moderate ([place]) ? ?Bipolar II disorder (HCC) ?-     lamoTRIgine (LAMICTAL) 100 MG tablet; Rich [place]

## 2022-01-31 ENCOUNTER — Encounter: Payer: Self-pay | Admitting: Internal Medicine

## 2022-01-31 DIAGNOSIS — M545 Low back pain, unspecified: Secondary | ICD-10-CM | POA: Diagnosis not present

## 2022-02-08 ENCOUNTER — Ambulatory Visit: Payer: Medicare HMO | Admitting: Adult Health

## 2022-02-22 ENCOUNTER — Other Ambulatory Visit: Payer: Self-pay | Admitting: Adult Health

## 2022-02-22 DIAGNOSIS — F411 Generalized anxiety disorder: Secondary | ICD-10-CM

## 2022-02-26 NOTE — Telephone Encounter (Signed)
I got this error message trying to sign it: The following medications were refills electronically requested by the pharmacy listed after each medication. You must change the selected pharmacy back to that pharmacy to sign these orders:       LORazepam (ATIVAN) 1 MG tablet: [facility] [place], [place]

## 2022-02-27 ENCOUNTER — Other Ambulatory Visit: Payer: Self-pay

## 2022-02-27 DIAGNOSIS — F411 Generalized anxiety disorder: Secondary | ICD-10-CM

## 2022-02-27 MED ORDER — LORAZEPAM 1 MG PO TABS: 1.0000 mg | ORAL_TABLET | Freq: Every day | ORAL | 0 refills | Status: DC

## 2022-03-04 NOTE — Telephone Encounter (Signed)
Was this resolved.  ??

## 2022-03-13 ENCOUNTER — Encounter: Payer: Self-pay | Admitting: Internal Medicine

## 2022-03-28 ENCOUNTER — Other Ambulatory Visit: Payer: Self-pay | Admitting: Adult Health

## 2022-03-28 DIAGNOSIS — F411 Generalized anxiety disorder: Secondary | ICD-10-CM

## 2022-03-29 ENCOUNTER — Telehealth: Payer: Self-pay | Admitting: Adult Health

## 2022-03-29 ENCOUNTER — Other Ambulatory Visit: Payer: Self-pay

## 2022-03-29 ENCOUNTER — Ambulatory Visit (INDEPENDENT_AMBULATORY_CARE_PROVIDER_SITE_OTHER): Payer: Medicare HMO

## 2022-03-29 DIAGNOSIS — F411 Generalized anxiety disorder: Secondary | ICD-10-CM

## 2022-03-29 DIAGNOSIS — R9389 Abnormal findings on diagnostic imaging of other specified body structures: Secondary | ICD-10-CM | POA: Diagnosis not present

## 2022-03-29 DIAGNOSIS — Z87891 Personal history of nicotine dependence: Secondary | ICD-10-CM | POA: Diagnosis not present

## 2022-03-29 MED ORDER — LORAZEPAM 1 MG PO TABS: ORAL_TABLET | ORAL | 1 refills | Status: DC

## 2022-03-29 NOTE — Telephone Encounter (Signed)
Pended.

## 2022-03-29 NOTE — Telephone Encounter (Signed)
Next visit is 05/01/22. Monique [name] called requesting a refill on her Lorazepam called to:  [id number] [address], [place] - [place]  Phone:  [phone]  Fax:  [phone]

## 2022-04-09 ENCOUNTER — Encounter: Payer: Self-pay | Admitting: Obstetrics & Gynecology

## 2022-04-10 MED ORDER — ALENDRONATE SODIUM 70 MG PO TABS: 70.0000 mg | ORAL_TABLET | ORAL | 0 refills | Status: DC

## 2022-04-29 ENCOUNTER — Other Ambulatory Visit: Payer: Self-pay | Admitting: Obstetrics & Gynecology

## 2022-04-29 DIAGNOSIS — Z1231 Encounter for screening mammogram for malignant neoplasm of breast: Secondary | ICD-10-CM

## 2022-05-01 ENCOUNTER — Ambulatory Visit: Payer: Medicare HMO | Admitting: Adult Health

## 2022-05-01 ENCOUNTER — Encounter: Payer: Self-pay | Admitting: Adult Health

## 2022-05-01 DIAGNOSIS — F3181 Bipolar II disorder: Secondary | ICD-10-CM

## 2022-05-01 DIAGNOSIS — F331 Major depressive disorder, recurrent, moderate: Secondary | ICD-10-CM

## 2022-05-01 DIAGNOSIS — G4700 Insomnia, unspecified: Secondary | ICD-10-CM

## 2022-05-01 DIAGNOSIS — F411 Generalized anxiety disorder: Secondary | ICD-10-CM | POA: Diagnosis not present

## 2022-05-01 NOTE — Progress Notes (Signed)
Monique [name] [id number] Feb 07, 1956 66 y.o.  Subjective:   Patient ID:  Monique [name] is a 66 y.o. (DOB 1955/10/25) female.  Chief Complaint: No chief complaint on file.   HPI Monique [name] presents to the office today for follow-up of MDD, GAD, insomnia and BPD2.  Describes mood today as "ok" Pleasant. Tearful at times. Mood symptoms - reports some depression - sadness and disappointment. Denies anxiety and irritability. Reports some worry and rumination. Reports some over thinking. Stating "I'm doing ok". Has tapered off the Seroquel and plans to taper off the Lamictal. She and husband doing well. Stable interest and motivation. Taking medications as prescribed. Energy levels lower. Active, does not have a regular exercise routine. Enjoys some usual interests and activities. Married. Lives with husband (76) years. Family local. Spending time with family. Appetite adequate. Weight stable - 129 pounds - 67". Sleeps well most nights. Averages 7 to 8 hours. Focus and concentration varies. Completing tasks. Managing aspects of household. Semi-retired.  Denies SI or HI.  Denies AH or VH.  Previous medication trials:  Denies    GAD-7    [place] Office Visit from 04/06/2019 in [place]  Total GAD-7 Score 8      PHQ2-9    [place] Office Visit from 04/27/2020 in [place] at [facility] Visit from 04/06/2019 in [place] Visit from 10/09/2017 in [place] Visit from 09/25/2016 in [facility] Total Score 2 2 0 0  PHQ-9 Total Score 7 9 -- --        Review of Systems:  Review of Systems  Musculoskeletal:  Negative for gait problem.  Neurological:  Negative for tremors.  Psychiatric/Behavioral:         Please refer to HPI    Medications: I have reviewed the patient's current medications.  Current Outpatient Medications   Medication Sig Dispense Refill   acyclovir ointment (ZOVIRAX) 5 % Apply 1 application topically every 3 (three) hours. For recurrences of HSV. 15 g 4   alendronate (FOSAMAX) 70 MG tablet Take with a full glass of water on an empty stomach. 12 tablet 4   alendronate (FOSAMAX) 70 MG tablet Take 1 tablet (70 mg total) by mouth every 7 (seven) days. Take with a full glass of water on an empty stomach. 1 tablet 0   Ascorbic Acid (VITAMIN C PO) Take by mouth.     azelastine (ASTELIN) 0.1 % nasal spray SMARTSIG:1-2 Spray(s) Both Nares Twice Daily PRN     Biotin w/ Vitamins C & E (HAIR/SKIN/NAILS PO) Take by mouth.     Calcium Carbonate-Vit D-Min (CALCIUM 1200 PO) Take by mouth. Take 1 tab once daily.     lamoTRIgine (LAMICTAL) 100 MG tablet Take one tablet at bedtime. 90 tablet 3   levocetirizine (XYZAL) 5 MG tablet SMARTSIG:1 Tablet(s) By Mouth Every Evening     LORazepam (ATIVAN) 1 MG tablet TAKE 1 TO 2 TABLETS(1 TO 2 MG) BY MOUTH AT BEDTIME 60 tablet 1   metoprolol tartrate (LOPRESSOR) 50 MG tablet Take 1 tablet (50 mg total) by mouth 2 (two) times daily. (Patient taking differently: Take 50 mg by mouth as needed.) 180 tablet 3   QUEtiapine (SEROQUEL) 25 MG tablet Take 1 tablet (25 mg total) by mouth at bedtime. 90 tablet 1   sertraline (ZOLOFT) 100 MG tablet Take 1 tablet (100 mg total) by mouth daily. 90 tablet  3   sodium chloride (OCEAN) 0.65 % SOLN nasal spray Place 1 spray into both nostrils as needed for congestion.     traMADol (ULTRAM) 50 MG tablet TAKE 1 TABLET BY MOUTH DAILY AS NEEDED 15 tablet 3   valACYclovir (VALTREX) 1000 MG tablet TAKE 1 TABLET(1000 MG) BY MOUTH DAILY 30 tablet 2   No current facility-administered medications for this visit.    Medication Side Effects: None  Allergies: No Known Allergies  Past Medical History:  Diagnosis Date   Abnormal electrocardiogram    Allergic rhinitis    Allergy    seasonal allergies   Anxiety    on meds   Arthritis    R hip and  left thumb   Bipolar disorder (HCC)    Cancer (HCC)    basal skin cell CA-1979   COVID    Depression    some- on meds   GERD (gastroesophageal reflux disease)    not on meds at this time   History of hepatitis C    Hep B also   History of skin cancer 1980   HSV infection    Memory loss    MVP (mitral valve prolapse)    Osteoporosis    Paresthesia    Unspecified chronic bronchitis (HCC)     Past Medical History, Surgical history, Social history, and Family history were reviewed and updated as appropriate.   Please see review of systems for further details on the patient's review from today.   Objective:   Physical Exam:  There were no vitals taken for this visit.  Physical Exam Constitutional:      General: She is not in acute distress. Musculoskeletal:        General: No deformity.  Neurological:     Mental Status: She is alert and oriented to person, place, and time.     Coordination: Coordination normal.  Psychiatric:        Attention and Perception: Attention and perception normal. She does not perceive auditory or visual hallucinations.        Mood and Affect: Mood normal. Mood is not anxious or depressed. Affect is not labile, blunt, angry or inappropriate.        Speech: Speech normal.        Behavior: Behavior normal.        Thought Content: Thought content normal. Thought content is not paranoid or delusional. Thought content does not include homicidal or suicidal ideation. Thought content does not include homicidal or suicidal plan.        Cognition and Memory: Cognition and memory normal.        Judgment: Judgment normal.     Comments: Insight intact     Lab Review:     Component Value Date/Time   NA 138 01/14/2022 1522   K 4.7 01/14/2022 1522   CL 102 01/14/2022 1522   CO2 28 01/14/2022 1522   GLUCOSE 87 01/14/2022 1522   BUN 14 01/14/2022 1522   CREATININE 0.79 01/14/2022 1522   CREATININE 0.76 04/27/2020 1446   CALCIUM 9.4 01/14/2022 1522    PROT 7.7 01/14/2022 1522   ALBUMIN 4.6 01/14/2022 1522   AST 18 01/14/2022 1522   ALT 11 01/14/2022 1522   ALKPHOS 60 01/14/2022 1522   BILITOT 0.4 01/14/2022 1522   GFRNONAA 92.72 08/17/2009 1022   GFRAA 113 08/03/2007 1638       Component Value Date/Time   WBC 5.5 01/14/2022 1522   RBC 4.11 01/14/2022 1522  HGB 13.0 01/14/2022 1522   HCT 38.7 01/14/2022 1522   PLT 220.0 01/14/2022 1522   MCV 94.2 01/14/2022 1522   MCH 31.6 04/27/2020 1446   MCHC 33.5 01/14/2022 1522   RDW 13.9 01/14/2022 1522   LYMPHSABS 1.5 07/29/2014 1117   MONOABS 0.5 07/29/2014 1117   EOSABS 0.1 07/29/2014 1117   BASOSABS 0.0 07/29/2014 1117    No results found for: "POCLITH", "LITHIUM"   No results found for: "PHENYTOIN", "PHENOBARB", "VALPROATE", "CBMZ"   .res Assessment: Plan:    Plan:  PDMP reviewed  1. Lamictal '25mg'$  at hs - plans to d/c before next visit 2. Ativan '1mg'$  - take one and 1/2 tabs daily as needed 3. Zoloft '100mg'$  daily 4. Stopped Seroquel '25mg'$  at hs - 2 months ago.  Time spent with patient was 25 minutes. Greater than 50% of face to face time with patient was spent on counseling and coordination of care.    Counseled patient regarding potential benefits, risks, and side effects of Lamictal to include potential risk of Stevens-Johnson syndrome. Advised patient to stop taking Lamictal and contact office immediately if rash develops and to seek urgent medical attention if rash is severe and/or spreading quickly.   Discussed potential benefits, risk, and side effects of benzodiazepines to include potential risk of tolerance and dependence, as well as possible drowsiness.  Advised patient not to drive if experiencing drowsiness and to take lowest possible effective dose to minimize risk of dependence and tolerance.   RTC 4 weeks  Patient advised to contact office with any questions, adverse effects, or acute worsening in signs and symptoms. There are no diagnoses linked to this  encounter.   Please see After Visit Summary for patient specific instructions.  Future Appointments  Date Time Provider [place]  05/02/2022  2:30 PM [facility] 2 [facility]    No orders of the defined types were placed in this encounter.   -------------------------------

## 2022-05-02 ENCOUNTER — Ambulatory Visit
Admission: RE | Admit: 2022-05-02 | Discharge: 2022-05-02 | Disposition: A | Discharge status: Home / Self Care | Payer: Medicare HMO | Source: Ambulatory Visit | Attending: Obstetrics & Gynecology | Admitting: Obstetrics & Gynecology

## 2022-05-02 DIAGNOSIS — Z1231 Encounter for screening mammogram for malignant neoplasm of breast: Secondary | ICD-10-CM

## 2022-05-03 ENCOUNTER — Encounter: Payer: Self-pay | Admitting: Physician Assistant

## 2022-05-09 ENCOUNTER — Telehealth: Payer: Self-pay | Admitting: Internal Medicine

## 2022-05-09 DIAGNOSIS — K589 Irritable bowel syndrome without diarrhea: Secondary | ICD-10-CM

## 2022-05-09 DIAGNOSIS — R143 Flatulence: Secondary | ICD-10-CM

## 2022-05-09 NOTE — Telephone Encounter (Signed)
Patient made an appointment for [facility] for 06/05/2022 - patient spoke with her insurance company and they told her she wouold need a referral sent in.  Please send this referral since patient has already scheduled the appointment.

## 2022-06-05 ENCOUNTER — Ambulatory Visit: Payer: Medicare HMO | Admitting: Physician Assistant

## 2022-06-15 IMAGING — MG MM DIGITAL SCREENING BILAT W/ TOMO AND CAD
8 series · 9 of 24 positions shown · non-contrast
Comparison: Previous exam(s).

CLINICAL DATA: Screening.

EXAM:
DIGITAL SCREENING BILATERAL MAMMOGRAM WITH TOMOSYNTHESIS AND CAD
TECHNIQUE: Bilateral screening digital craniocaudal and mediolateral oblique
mammograms were obtained. Bilateral screening digital breast
tomosynthesis was performed. The images were evaluated with
computer-aided detection.

[L MLO synth-2D]
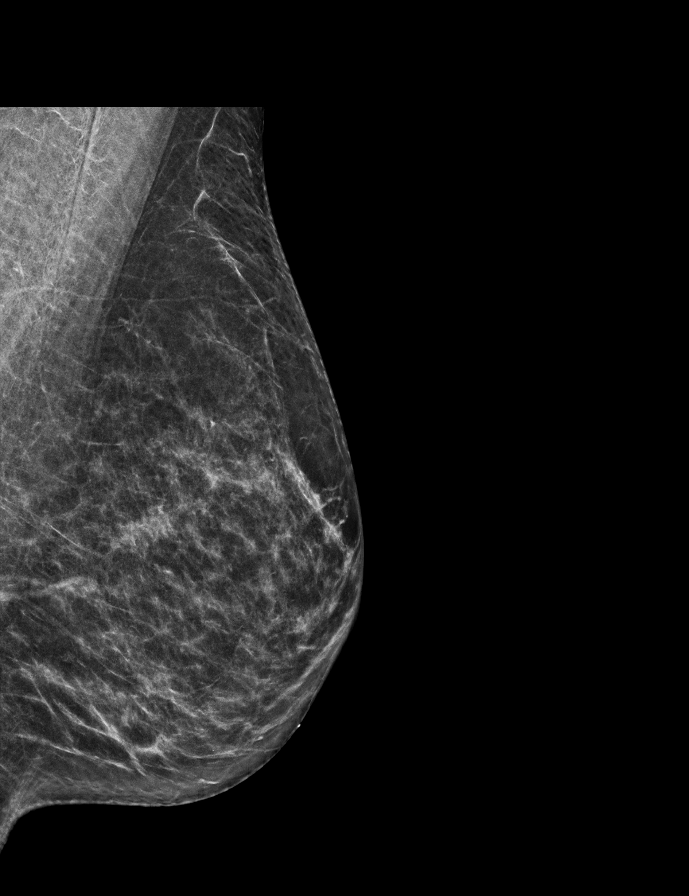

[R CC synth-2D]
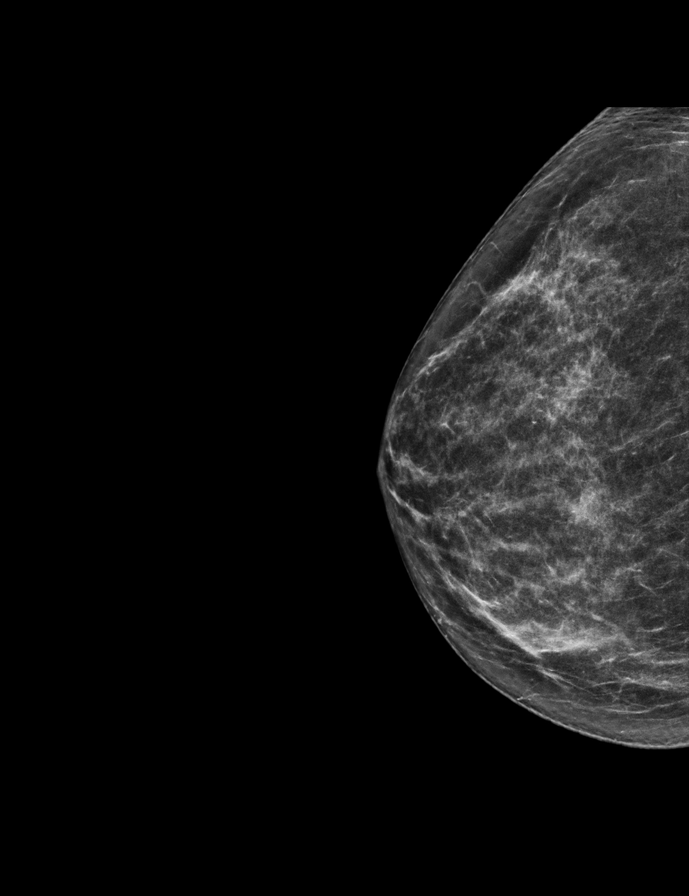

[L CC synth-2D]
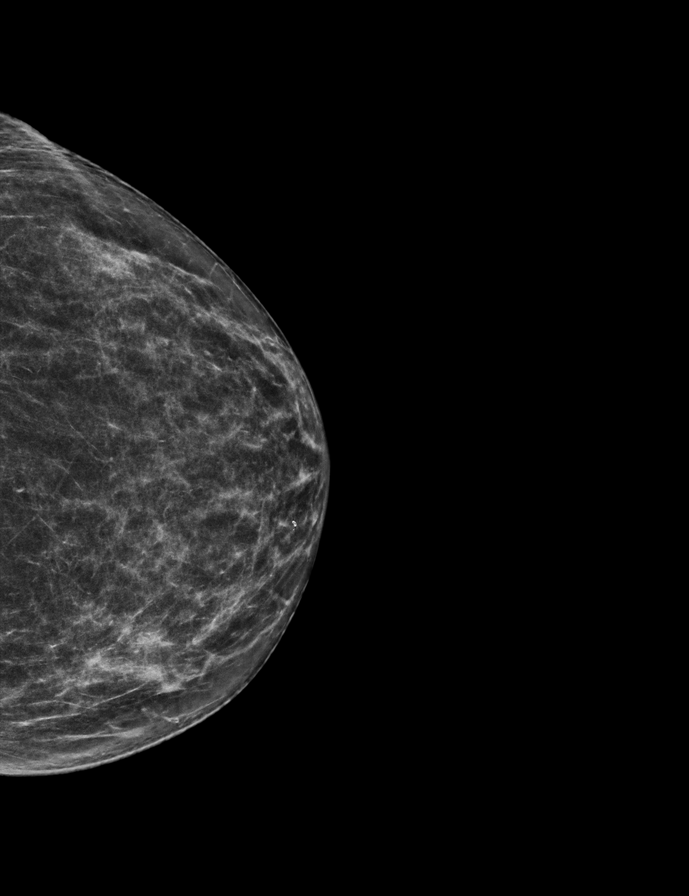

[R MLO synth-2D]
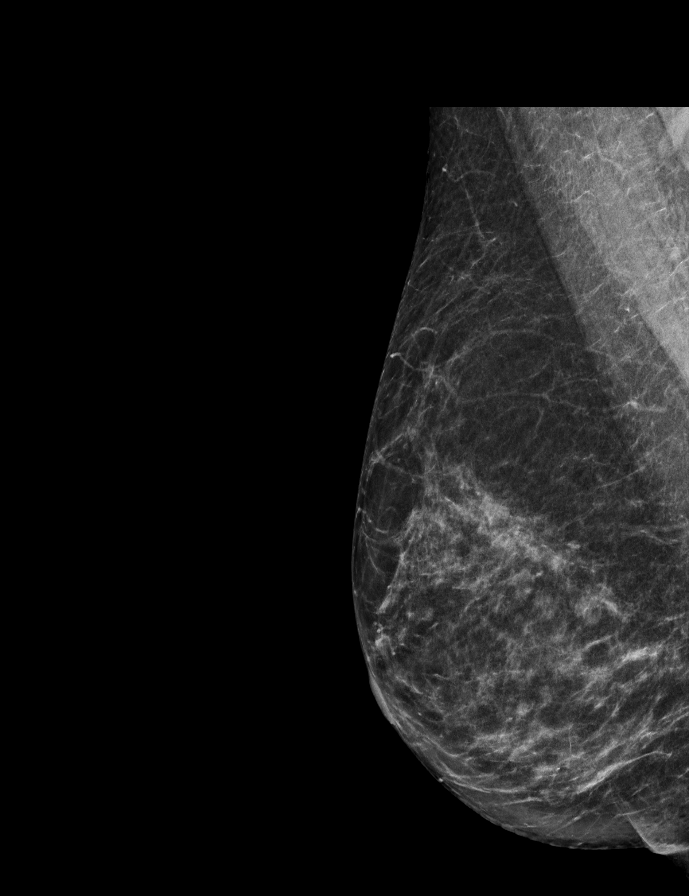

[L MLO tomo · 2 of 58 frames shown]
[frame 19/58]
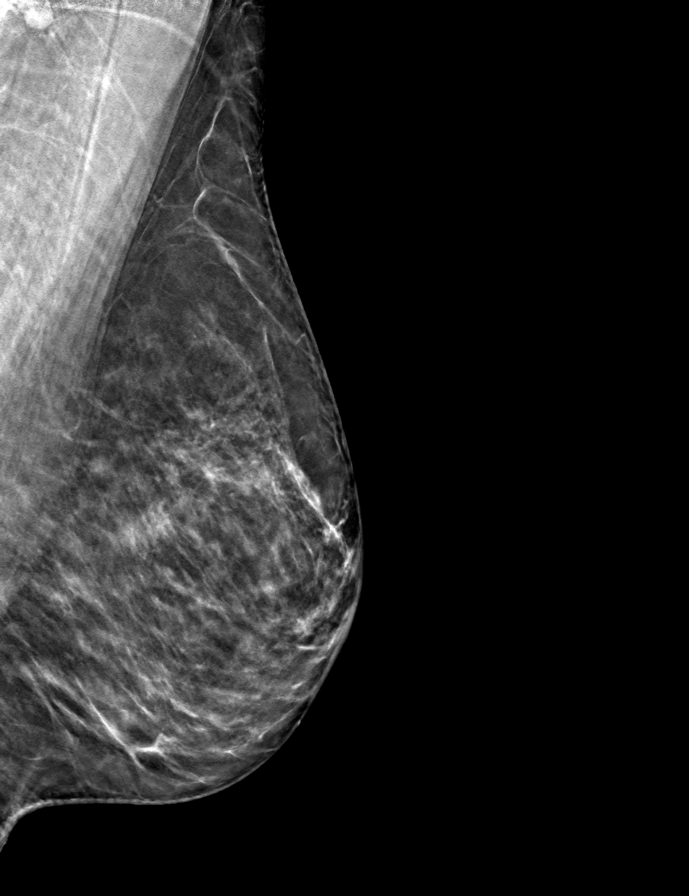
[frame 29/58]
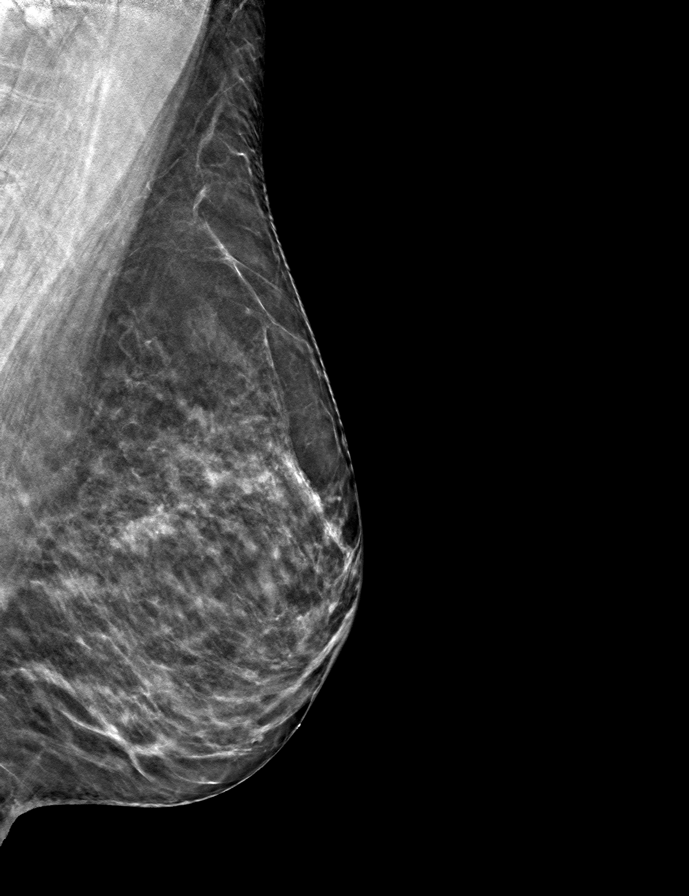

[L CC tomo · tomo slice 29/58.0]
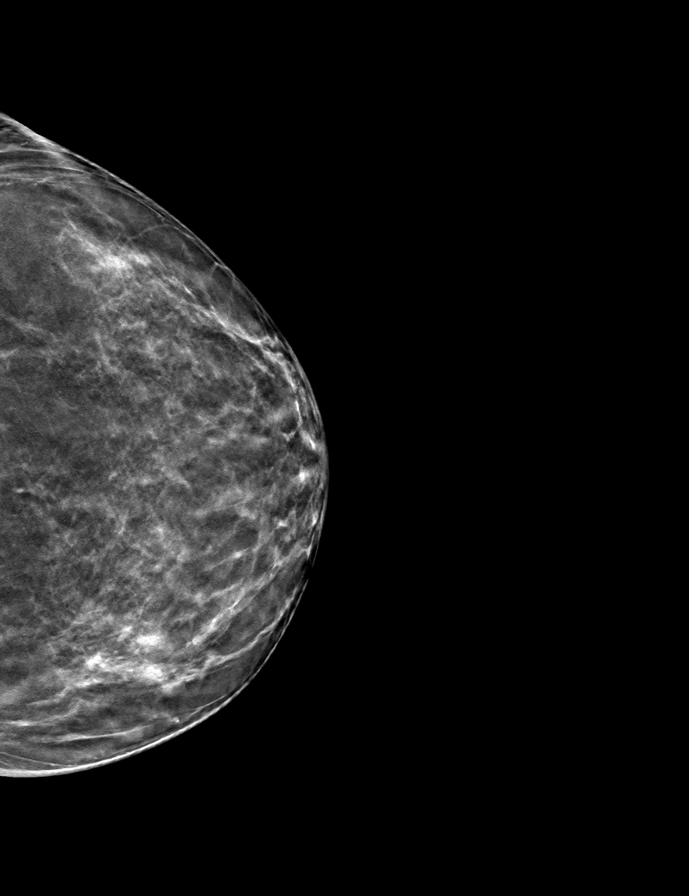

[R MLO tomo · tomo slice 31/62.0]
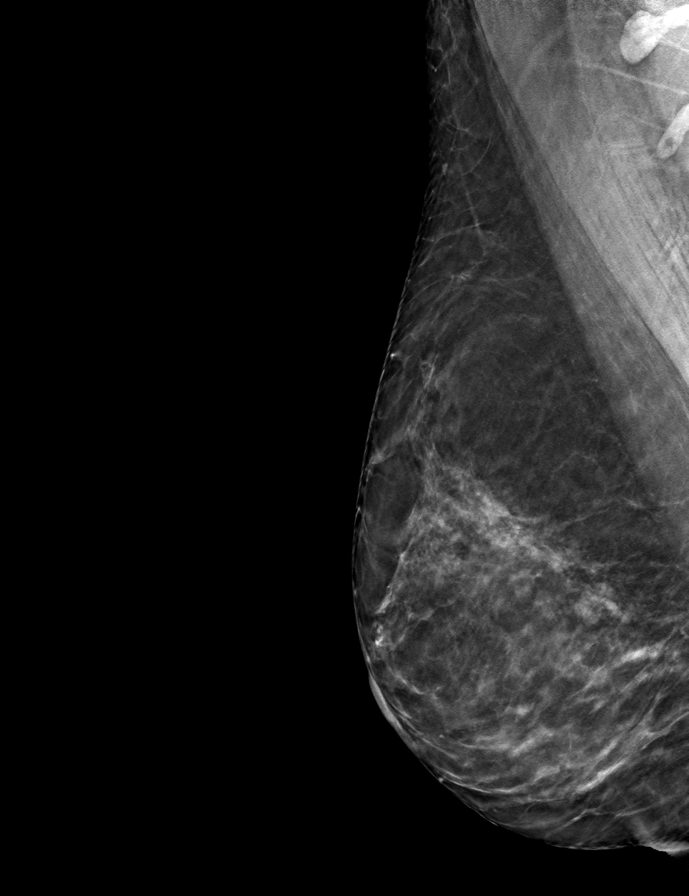

[R CC tomo · tomo slice 29/58.0]
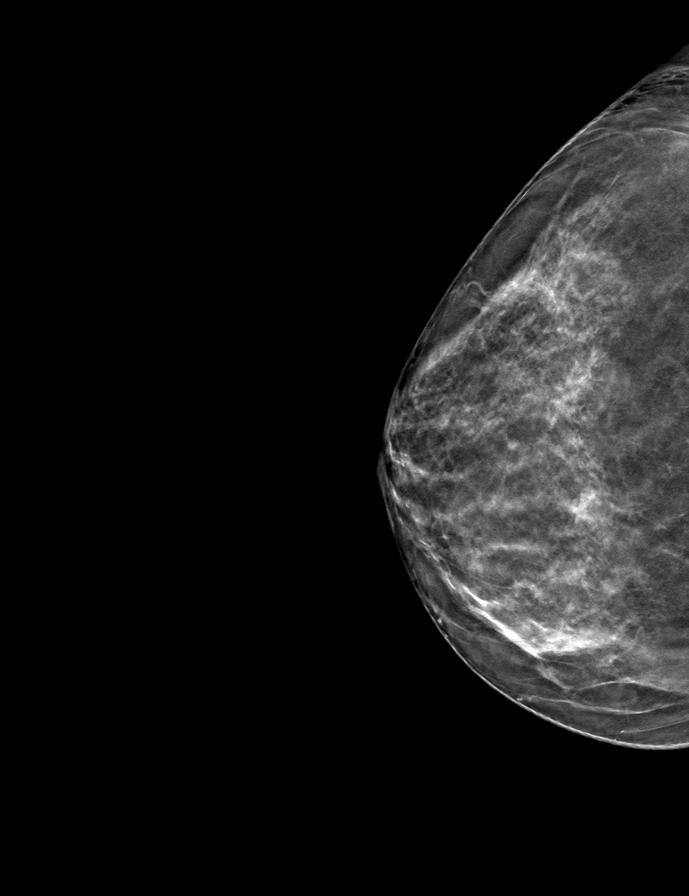

[9 of 24 positions shown; findings below may reference images not displayed]

ACR Breast Density Category c: The breast tissue is heterogeneously
dense, which may obscure small masses.
FINDINGS: There are no findings suspicious for malignancy.
IMPRESSION: No mammographic evidence of malignancy. A result letter of this
screening mammogram will be mailed directly to the patient.

RECOMMENDATION:
Screening mammogram in one year. (Code:Q3-W-BC3)

BI-RADS CATEGORY  1: Negative.

## 2022-07-02 ENCOUNTER — Other Ambulatory Visit: Payer: Self-pay | Admitting: Obstetrics & Gynecology

## 2022-07-02 ENCOUNTER — Ambulatory Visit: Payer: Medicare HMO | Admitting: Adult Health

## 2022-07-02 ENCOUNTER — Encounter: Payer: Self-pay | Admitting: Adult Health

## 2022-07-02 ENCOUNTER — Encounter: Payer: Self-pay | Admitting: Internal Medicine

## 2022-07-02 DIAGNOSIS — F331 Major depressive disorder, recurrent, moderate: Secondary | ICD-10-CM | POA: Diagnosis not present

## 2022-07-02 DIAGNOSIS — F3181 Bipolar II disorder: Secondary | ICD-10-CM | POA: Diagnosis not present

## 2022-07-02 DIAGNOSIS — G4700 Insomnia, unspecified: Secondary | ICD-10-CM

## 2022-07-02 DIAGNOSIS — F411 Generalized anxiety disorder: Secondary | ICD-10-CM

## 2022-07-02 MED ORDER — BUPROPION HCL ER (XL) 150 MG PO TB24: 150.0000 mg | ORAL_TABLET | Freq: Every day | ORAL | 2 refills | Status: DC

## 2022-07-02 NOTE — Progress Notes (Signed)
Nigeria H. Mccaul [id number] [birth year] 66 y.o.  Subjective:   Patient ID:  Monique [name] is a 66 y.o. (DOB [birth year]) female.  Chief Complaint: No chief complaint on file.   HPI Media Pizzini. Monique [name] presents to the office today for follow-up of  MDD, GAD, insomnia and BPD2.  Describes mood today as "ok" Pleasant. Tearful at times. Mood symptoms - reports depression and anxiety. Denies irritability. Reports some worry and rumination. Reports some over thinking. Mood is variable. Stating "I'm doing as well as I was". Willing to consider other options to help mange mood symptoms. She and husband doing well. Stable interest and motivation. Taking medications as prescribed. Energy levels lower. Active, does not have a regular exercise routine. Enjoys some usual interests and activities. Married. Lives with husband. Family local. Spending time with family. Appetite adequate. Weight stable - 129 pounds - 67". Sleeps better some nights than others. Averages 6 to 8 hours. Focus and concentration difficulties. Completing tasks. Managing aspects of household. Working part-time.  Denies SI or HI.  Denies AH or VH.  Previous medication trials:  Denies  GAD-7    Flowsheet Row Office Visit from 04/06/2019 in [place]  Total GAD-7 Score 8      PHQ2-9    [place] Office Visit from 04/27/2020 in [place] at [facility] Visit from 04/06/2019 in [place] Visit from 10/09/2017 in [place] Visit from 09/25/2016 in Primary Care at [facility] Total Score 2 2 0 0  PHQ-9 Total Score 7 9 -- --        Review of Systems:  Review of Systems  Musculoskeletal:  Negative for gait problem.  Neurological:  Negative for tremors.  Psychiatric/Behavioral:         Please refer to HPI    Medications: I have reviewed the patient's current medications.  Current Outpatient Medications   Medication Sig Dispense Refill   buPROPion (WELLBUTRIN XL) 150 MG 24 hr tablet Take 1 tablet (150 mg total) by mouth daily. 30 tablet 2   acyclovir ointment (ZOVIRAX) 5 % Apply 1 application topically every 3 (three) hours. For recurrences of HSV. 15 g 4   alendronate (FOSAMAX) 70 MG tablet Take with a full glass of water on an empty stomach. 12 tablet 4   alendronate (FOSAMAX) 70 MG tablet Take 1 tablet (70 mg total) by mouth every 7 (seven) days. Take with a full glass of water on an empty stomach. 1 tablet 0   Ascorbic Acid (VITAMIN C PO) Take by mouth.     azelastine (ASTELIN) 0.1 % nasal spray SMARTSIG:1-2 Spray(s) Both Nares Twice Daily PRN     Biotin w/ Vitamins C & E (HAIR/SKIN/NAILS PO) Take by mouth.     Calcium Carbonate-Vit D-Min (CALCIUM 1200 PO) Take by mouth. Take 1 tab once daily.     levocetirizine (XYZAL) 5 MG tablet SMARTSIG:1 Tablet(s) By Mouth Every Evening     LORazepam (ATIVAN) 1 MG tablet TAKE 1 TO 2 TABLETS(1 TO 2 MG) BY MOUTH AT BEDTIME 60 tablet 1   metoprolol tartrate (LOPRESSOR) 50 MG tablet Take 1 tablet (50 mg total) by mouth 2 (two) times daily. (Patient taking differently: Take 50 mg by mouth as needed.) 180 tablet 3   sertraline (ZOLOFT) 100 MG tablet Take 1 tablet (100 mg total) by mouth daily. 90 tablet 3   sodium chloride (OCEAN) 0.65 % SOLN nasal spray Place 1  spray into both nostrils as needed for congestion.     traMADol (ULTRAM) 50 MG tablet TAKE 1 TABLET BY MOUTH DAILY AS NEEDED 15 tablet 3   valACYclovir (VALTREX) 1000 MG tablet TAKE 1 TABLET(1000 MG) BY MOUTH DAILY 30 tablet 2   No current facility-administered medications for this visit.    Medication Side Effects: None  Allergies: No Known Allergies  Past Medical History:  Diagnosis Date   Abnormal electrocardiogram    Allergic rhinitis    Allergy    seasonal allergies   Anxiety    on meds   Arthritis    R hip and left thumb   Bipolar disorder (HCC)    Cancer (HCC)    basal skin cell  CA-1979   COVID    Depression    some- on meds   GERD (gastroesophageal reflux disease)    not on meds at this time   History of hepatitis C    Hep B also   History of skin cancer 1980   HSV infection    Memory loss    MVP (mitral valve prolapse)    Osteoporosis    Paresthesia    Unspecified chronic bronchitis (HCC)     Past Medical History, Surgical history, Social history, and Family history were reviewed and updated as appropriate.   Please see review of systems for further details on the patient's review from today.   Objective:   Physical Exam:  There were no vitals taken for this visit.  Physical Exam Constitutional:      General: She is not in acute distress. Musculoskeletal:        General: No deformity.  Neurological:     Mental Status: She is alert and oriented to person, place, and time.     Coordination: Coordination normal.  Psychiatric:        Attention and Perception: Attention and perception normal. She does not perceive auditory or visual hallucinations.        Mood and Affect: Mood normal. Mood is not anxious or depressed. Affect is not labile, blunt, angry or inappropriate.        Speech: Speech normal.        Behavior: Behavior normal.        Thought Content: Thought content normal. Thought content is not paranoid or delusional. Thought content does not include homicidal or suicidal ideation. Thought content does not include homicidal or suicidal plan.        Cognition and Memory: Cognition and memory normal.        Judgment: Judgment normal.     Comments: Insight intact     Lab Review:     Component Value Date/Time   NA 138 01/14/2022 1522   K 4.7 01/14/2022 1522   CL 102 01/14/2022 1522   CO2 28 01/14/2022 1522   GLUCOSE 87 01/14/2022 1522   BUN 14 01/14/2022 1522   CREATININE 0.79 01/14/2022 1522   CREATININE 0.76 04/27/2020 1446   CALCIUM 9.4 01/14/2022 1522   PROT 7.7 01/14/2022 1522   ALBUMIN 4.6 01/14/2022 1522   AST 18 01/14/2022  1522   ALT 11 01/14/2022 1522   ALKPHOS 60 01/14/2022 1522   BILITOT 0.4 01/14/2022 1522   GFRNONAA 92.72 08/17/2009 1022   GFRAA 113 08/03/2007 1638       Component Value Date/Time   WBC 5.5 01/14/2022 1522   RBC 4.11 01/14/2022 1522   HGB 13.0 01/14/2022 1522   HCT 38.7 01/14/2022 1522   PLT  220.0 01/14/2022 1522   MCV 94.2 01/14/2022 1522   MCH 31.6 04/27/2020 1446   MCHC 33.5 01/14/2022 1522   RDW 13.9 01/14/2022 1522   LYMPHSABS 1.5 07/29/2014 1117   MONOABS 0.5 07/29/2014 1117   EOSABS 0.1 07/29/2014 1117   BASOSABS 0.0 07/29/2014 1117    No results found for: "POCLITH", "LITHIUM"   No results found for: "PHENYTOIN", "PHENOBARB", "VALPROATE", "CBMZ"   .res Assessment: Plan:    Plan:  PDMP reviewed  D/C Seroquel '25mg'$  at hs - 2 months ago. D/C Lamictal '25mg'$  at hs - plans to d/c before next visit  Ativan '1mg'$  - take one and 1/2 tabs daily as needed Zoloft '100mg'$  daily Wellbutrin XL '150mg'$    Time spent with patient was 25 minutes. Greater than 50% of face to face time with patient was spent on counseling and coordination of care.    Counseled patient regarding potential benefits, risks, and side effects of Lamictal to include potential risk of Stevens-Johnson syndrome. Advised patient to stop taking Lamictal and contact office immediately if rash develops and to seek urgent medical attention if rash is severe and/or spreading quickly.   Discussed potential benefits, risk, and side effects of benzodiazepines to include potential risk of tolerance and dependence, as well as possible drowsiness. Advised patient not to drive if experiencing drowsiness and to take lowest possible effective dose to minimize risk of dependence and tolerance.   RTC 4 weeks  Patient advised to contact office with any questions, adverse effects, or acute worsening in signs and symptoms.  Diagnoses and all orders for this visit:  Bipolar II disorder ([place])  Major depressive disorder,  recurrent episode, moderate (HCC) -     buPROPion (WELLBUTRIN XL) 150 MG 24 hr tablet; Take 1 tablet (150 mg total) by mouth daily.  Generalized anxiety disorder  Insomnia, unspecified type     Please see After Visit Summary for patient specific instructions.  Future Appointments  Date Time Provider [place]  08/15/2022  3:00 PM Princess Bruins, MD [facility] None  08/30/2022  1:20 PM Cornella Emmer, Berdie Ogren, NP [facility] None    No orders of the defined types were placed in this encounter.   -------------------------------

## 2022-07-03 NOTE — Telephone Encounter (Signed)
Medication refill request: Valtrex  Last AEX:  08/14/21 Next AEX: 08/15/22 Last MMG (if hormonal medication request): n/a Refill authorized: #30 with 2 rf pended for today

## 2022-07-04 NOTE — Telephone Encounter (Signed)
ML pt.

## 2022-07-05 MED ORDER — TRAMADOL HCL 50 MG PO TABS: 50.0000 mg | ORAL_TABLET | Freq: Every day | ORAL | 1 refills | Status: DC | PRN

## 2022-07-08 ENCOUNTER — Other Ambulatory Visit: Payer: Self-pay

## 2022-07-08 ENCOUNTER — Telehealth: Payer: Self-pay | Admitting: Adult Health

## 2022-07-08 DIAGNOSIS — F411 Generalized anxiety disorder: Secondary | ICD-10-CM

## 2022-07-08 MED ORDER — LORAZEPAM 1 MG PO TABS: ORAL_TABLET | ORAL | 1 refills | Status: DC

## 2022-07-08 NOTE — Telephone Encounter (Signed)
Pt lvm that she has not been able to get her lorazapam filled. She has missed two doses. Pharmacy is [facility] on [address]

## 2022-07-08 NOTE — Telephone Encounter (Signed)
Pended.

## 2022-07-23 ENCOUNTER — Other Ambulatory Visit: Payer: Self-pay

## 2022-07-23 ENCOUNTER — Telehealth: Payer: Self-pay | Admitting: Adult Health

## 2022-07-23 DIAGNOSIS — F331 Major depressive disorder, recurrent, moderate: Secondary | ICD-10-CM

## 2022-07-23 MED ORDER — BUPROPION HCL ER (XL) 300 MG PO TB24: 300.0000 mg | ORAL_TABLET | Freq: Every day | ORAL | 0 refills | Status: DC

## 2022-07-23 NOTE — Telephone Encounter (Signed)
Spoke to pt rx sent  

## 2022-07-23 NOTE — Telephone Encounter (Signed)
Pt lvm reporting she is ready to go up on Wellbutrin dose. Starting to feel better. Advise Pt if she can increase dose. Contact # [phone]   Apt 12/1

## 2022-07-23 NOTE — Telephone Encounter (Signed)
Confirm Wellbutrin XL'300mg'$  as dose requesting and go ahead and send in.

## 2022-07-23 NOTE — Telephone Encounter (Signed)
Please advise 

## 2022-08-13 DIAGNOSIS — H1045 Other chronic allergic conjunctivitis: Secondary | ICD-10-CM | POA: Diagnosis not present

## 2022-08-13 DIAGNOSIS — H25811 Combined forms of age-related cataract, right eye: Secondary | ICD-10-CM | POA: Diagnosis not present

## 2022-08-13 DIAGNOSIS — H43813 Vitreous degeneration, bilateral: Secondary | ICD-10-CM | POA: Diagnosis not present

## 2022-08-13 DIAGNOSIS — H2512 Age-related nuclear cataract, left eye: Secondary | ICD-10-CM | POA: Diagnosis not present

## 2022-08-13 DIAGNOSIS — H04123 Dry eye syndrome of bilateral lacrimal glands: Secondary | ICD-10-CM | POA: Diagnosis not present

## 2022-08-15 ENCOUNTER — Other Ambulatory Visit (HOSPITAL_COMMUNITY)
Admission: RE | Admit: 2022-08-15 | Discharge: 2022-08-15 | Disposition: A | Discharge status: Home / Self Care | Payer: Medicare HMO | Source: Ambulatory Visit | Attending: Obstetrics & Gynecology | Admitting: Obstetrics & Gynecology

## 2022-08-15 ENCOUNTER — Ambulatory Visit (INDEPENDENT_AMBULATORY_CARE_PROVIDER_SITE_OTHER): Payer: Medicare HMO | Admitting: Obstetrics & Gynecology

## 2022-08-15 ENCOUNTER — Encounter: Payer: Self-pay | Admitting: Obstetrics & Gynecology

## 2022-08-15 VITALS — BP 110/64 | HR 60 | Ht 66.25 in | Wt 125.0 lb

## 2022-08-15 DIAGNOSIS — M810 Age-related osteoporosis without current pathological fracture: Secondary | ICD-10-CM

## 2022-08-15 DIAGNOSIS — Z01419 Encounter for gynecological examination (general) (routine) without abnormal findings: Secondary | ICD-10-CM | POA: Insufficient documentation

## 2022-08-15 DIAGNOSIS — Z9189 Other specified personal risk factors, not elsewhere classified: Secondary | ICD-10-CM | POA: Diagnosis not present

## 2022-08-15 DIAGNOSIS — Z124 Encounter for screening for malignant neoplasm of cervix: Secondary | ICD-10-CM

## 2022-08-15 DIAGNOSIS — Z9289 Personal history of other medical treatment: Secondary | ICD-10-CM | POA: Diagnosis not present

## 2022-08-15 DIAGNOSIS — Z780 Asymptomatic menopausal state: Secondary | ICD-10-CM

## 2022-08-15 DIAGNOSIS — Z8619 Personal history of other infectious and parasitic diseases: Secondary | ICD-10-CM

## 2022-08-15 MED ORDER — ALENDRONATE SODIUM 70 MG PO TABS: ORAL_TABLET | ORAL | 4 refills | Status: DC

## 2022-08-15 NOTE — Progress Notes (Signed)
Monique [name] 1955-12-28 [id number]   History:    66 y.o.  G0 Married   RP:  Established patient presenting for annual gyn exam    HPI: Postmenopause, well on no HRT.  No PMB.  Occasional lower abdominal discomfort.  Tendency for constipation.  Urine normal. Abstinent.  Pap Neg in 08/2018.  Breast normal.  Mammo 04/2022 Neg. Osteoporosis on Fosamax, last BD T-Score -2.4 04/2021.  Rare genital HSV recurrences on Valacyclovir prophylaxis, would like to try without. Body mass index 20.02.  In good fitness.  Health labs with family physician.  [place] 06/2020.  Mother with Colon Ca.   Past medical history,surgical history, family history and social history were all reviewed and documented in the EPIC chart.  Gynecologic History No LMP recorded. Patient is postmenopausal.  Obstetric History OB History  Gravida Para Term Preterm AB Living  0 0 0 0 0 0  SAB IAB Ectopic Multiple Live Births  0 0 0 0 0     ROS: A ROS was performed and pertinent positives and negatives are included in the history. GENERAL: No fevers or chills. HEENT: No change in vision, no earache, sore throat or sinus congestion. NECK: No pain or stiffness. CARDIOVASCULAR: No chest pain or pressure. No palpitations. PULMONARY: No shortness of breath, cough or wheeze. GASTROINTESTINAL: No abdominal pain, nausea, vomiting or diarrhea, melena or bright red blood per rectum. GENITOURINARY: No urinary frequency, urgency, hesitancy or dysuria. MUSCULOSKELETAL: No joint or muscle pain, no back pain, no recent trauma. DERMATOLOGIC: No rash, no itching, no lesions. ENDOCRINE: No polyuria, polydipsia, no heat or cold intolerance. No recent change in weight. HEMATOLOGICAL: No anemia or easy bruising or bleeding. NEUROLOGIC: No headache, seizures, numbness, tingling or weakness. PSYCHIATRIC: No depression, no loss of interest in normal activity or change in sleep pattern.     Exam:   BP 110/64   Pulse 60   Ht 5' 6.25" (1.683 m)    Wt 125 lb (56.7 kg)   SpO2 98%   BMI 20.02 kg/m   Body mass index is 20.02 kg/m.  General appearance : Well developed well nourished female. No acute distress HEENT: Eyes: no retinal hemorrhage or exudates,  Neck supple, trachea midline, no carotid bruits, no thyroidmegaly Lungs: Clear to auscultation, no rhonchi or wheezes, or rib retractions  Heart: Regular rate and rhythm, no murmurs or gallops Breast:Examined in sitting and supine position were symmetrical in appearance, no palpable masses or tenderness,  no skin retraction, no nipple inversion, no nipple discharge, no skin discoloration, no axillary or supraclavicular lymphadenopathy Abdomen: no palpable masses or tenderness, no rebound or guarding Extremities: no edema or skin discoloration or tenderness  Pelvic: Vulva: Normal             Vagina: No gross lesions or discharge  Cervix: No gross lesions or discharge.  Pap reflex done.  Uterus  AV, normal size, shape and consistency, non-tender and mobile  Adnexa  Without masses or tenderness  Anus: Normal   Assessment/Plan:  66 y.o. female for annual exam   1. Encounter for routine gynecological examination with Papanicolaou smear of cervix Postmenopause, well on no HRT.  No PMB.  Occasional lower abdominal discomfort.  Tendency for constipation.  Urine normal. Abstinent.  Pap Neg in 08/2018.  Breast normal.  Mammo 04/2022 Neg. Osteoporosis on Fosamax, last BD T-Score -2.4 04/2021.  Rare genital HSV recurrences on Valacyclovir prophylaxis, would like to try without. Body mass index 20.02.  In good fitness.  Health labs with family physician.  [place] 06/2020.  Mother with Colon Ca. - Cytology - PAP( [place])  2. Postmenopausal Postmenopause, well on no HRT.  No PMB.  Occasional lower abdominal discomfort.  Tendency for constipation.  Urine normal. Abstinent.  3. Age-related osteoporosis without current pathological fracture Last BD with Osteopenia, T-Score -2.4 with FRAX not  elevated.  On Fosamax x 3 years.  No CI to continue.  Prescription sent to pharmacy. - alendronate (FOSAMAX) 70 MG tablet; Take with a full glass of water on an empty stomach.   4. H/O herpes genitalis Decision to stop Valacyclovir prophylaxis.  5. Other specified personal risk factors, not elsewhere classified  6. Personal history of other medical treatment  Other orders - ketorolac (ACULAR) 0.5 % ophthalmic solution; Place 1 drop into the right eye 4 (four) times daily. - MELATONIN PO; Take 2 mg by mouth.   Princess Bruins MD, 3:20 PM 08/15/2022

## 2022-08-17 ENCOUNTER — Encounter: Payer: Self-pay | Admitting: Obstetrics & Gynecology

## 2022-08-19 ENCOUNTER — Other Ambulatory Visit: Payer: Self-pay | Admitting: Adult Health

## 2022-08-19 DIAGNOSIS — F331 Major depressive disorder, recurrent, moderate: Secondary | ICD-10-CM

## 2022-08-19 LAB — CYTOLOGY - PAP: Diagnosis: NEGATIVE

## 2022-08-29 DIAGNOSIS — M545 Low back pain, unspecified: Secondary | ICD-10-CM | POA: Diagnosis not present

## 2022-08-30 ENCOUNTER — Ambulatory Visit: Payer: Medicare HMO | Admitting: Adult Health

## 2022-08-30 ENCOUNTER — Encounter: Payer: Self-pay | Admitting: Adult Health

## 2022-08-30 DIAGNOSIS — F331 Major depressive disorder, recurrent, moderate: Secondary | ICD-10-CM

## 2022-08-30 DIAGNOSIS — G4700 Insomnia, unspecified: Secondary | ICD-10-CM | POA: Diagnosis not present

## 2022-08-30 DIAGNOSIS — F3181 Bipolar II disorder: Secondary | ICD-10-CM | POA: Diagnosis not present

## 2022-08-30 DIAGNOSIS — F411 Generalized anxiety disorder: Secondary | ICD-10-CM | POA: Diagnosis not present

## 2022-08-30 MED ORDER — LORAZEPAM 1 MG PO TABS: ORAL_TABLET | ORAL | 2 refills | Status: DC

## 2022-08-30 NOTE — Progress Notes (Signed)
Monique [name] [id number] 02-24-1956 66 y.o.  Subjective:   Patient ID:  Monique [name] is a 66 y.o. (DOB Feb 21, 1956) female.  Chief Complaint: No chief complaint on file.   HPI Monique [name] presents to the office today for follow-up of MDD, GAD, insomnia and BPD2.  Describes mood today as "ok" Pleasant. Tearful at times. Mood symptoms - denies depression, anxiety and irritability. Reports decreased worry and rumination. Reports some over thinking. Mood is consistent. Stating "I'm doing alright". Feels like the Wellbutrin has been helpful for mood symptoms, but is over activating - willing to reduce dose. She and husband doing well. Stable interest and motivation. Taking medications as prescribed. Energy levels better. Active, does not have a regular exercise routine - has started walking. Enjoys some usual interests and activities. Married. Lives with husband. Family local. Spending time with family. Appetite adequate. Weight stable - 129 pounds - 66". Sleeps better some nights than others. Averages 8 to 9 hours. Focus and concentration difficulties. Completing tasks. Managing aspects of household. Working part-time.  Denies SI or HI.  Denies AH or VH.  Previous medication trials:  Denies   GAD-7    Flowsheet Row Office Visit from 04/06/2019 in [place]  Total GAD-7 Score 8      PHQ2-9    [place] Office Visit from 04/27/2020 in [place] at [facility] Visit from 04/06/2019 in [place] Visit from 10/09/2017 in [place] Visit from 09/25/2016 in Primary Care at [facility] Total Score 2 2 0 0  PHQ-9 Total Score 7 9 -- --        Review of Systems:  Review of Systems  Musculoskeletal:  Negative for gait problem.  Neurological:  Negative for tremors.  Psychiatric/Behavioral:         Please refer to HPI    Medications: I have reviewed the  patient's current medications.  Current Outpatient Medications  Medication Sig Dispense Refill   alendronate (FOSAMAX) 70 MG tablet Take with a full glass of water on an empty stomach. 12 tablet 4   Ascorbic Acid (VITAMIN C PO) Take by mouth.     azelastine (ASTELIN) 0.1 % nasal spray SMARTSIG:1-2 Spray(s) Both Nares Twice Daily PRN     Biotin w/ Vitamins C & E (HAIR/SKIN/NAILS PO) Take by mouth.     buPROPion (WELLBUTRIN XL) 300 MG 24 hr tablet TAKE 1 TABLET(300 MG) BY MOUTH DAILY 30 tablet 0   Calcium Carbonate-Vit D-Min (CALCIUM 1200 PO) Take by mouth. Take 1 tab once daily.     ketorolac (ACULAR) 0.5 % ophthalmic solution Place 1 drop into the right eye 4 (four) times daily.     levocetirizine (XYZAL) 5 MG tablet SMARTSIG:1 Tablet(s) By Mouth Every Evening     LORazepam (ATIVAN) 1 MG tablet TAKE 1 TO 2 TABLETS(1 TO 2 MG) BY MOUTH AT BEDTIME 60 tablet 2   MELATONIN PO Take 2 mg by mouth.     metoprolol tartrate (LOPRESSOR) 50 MG tablet Take 1 tablet (50 mg total) by mouth 2 (two) times daily. (Patient taking differently: Take 50 mg by mouth as needed.) 180 tablet 3   sertraline (ZOLOFT) 100 MG tablet Take 1 tablet (100 mg total) by mouth daily. 90 tablet 3   sodium chloride (OCEAN) 0.65 % SOLN nasal spray Place 1 spray into both nostrils as needed for congestion.     traMADol (ULTRAM) 50 MG tablet  Take 1 tablet (50 mg total) by mouth daily as needed. 15 tablet 1   valACYclovir (VALTREX) 1000 MG tablet TAKE 1 TABLET(1000 MG) BY MOUTH DAILY 30 tablet 1   No current facility-administered medications for this visit.    Medication Side Effects: None  Allergies: No Known Allergies  Past Medical History:  Diagnosis Date   Abnormal electrocardiogram    Allergic rhinitis    Allergy    seasonal allergies   Anxiety    on meds   Arthritis    R hip and left thumb   Bipolar disorder (HCC)    Cancer (HCC)    basal skin cell CA-1979   Cataracts, bilateral    COVID    Depression    some-  on meds   GERD (gastroesophageal reflux disease)    not on meds at this time   History of hepatitis C    Hep B also   History of skin cancer 1980   HSV infection    Memory loss    MVP (mitral valve prolapse)    Osteoporosis    Paresthesia    Unspecified chronic bronchitis (HCC)     Past Medical History, Surgical history, Social history, and Family history were reviewed and updated as appropriate.   Please see review of systems for further details on the patient's review from today.   Objective:   Physical Exam:  There were no vitals taken for this visit.  Physical Exam Constitutional:      General: She is not in acute distress. Musculoskeletal:        General: No deformity.  Neurological:     Mental Status: She is alert and oriented to person, place, and time.     Coordination: Coordination normal.  Psychiatric:        Attention and Perception: Attention and perception normal. She does not perceive auditory or visual hallucinations.        Mood and Affect: Mood normal. Mood is not anxious or depressed. Affect is not labile, blunt, angry or inappropriate.        Speech: Speech normal.        Behavior: Behavior normal.        Thought Content: Thought content normal. Thought content is not paranoid or delusional. Thought content does not include homicidal or suicidal ideation. Thought content does not include homicidal or suicidal plan.        Cognition and Memory: Cognition and memory normal.        Judgment: Judgment normal.     Comments: Insight intact     Lab Review:     Component Value Date/Time   NA 138 01/14/2022 1522   K 4.7 01/14/2022 1522   CL 102 01/14/2022 1522   CO2 28 01/14/2022 1522   GLUCOSE 87 01/14/2022 1522   BUN 14 01/14/2022 1522   CREATININE 0.79 01/14/2022 1522   CREATININE 0.76 04/27/2020 1446   CALCIUM 9.4 01/14/2022 1522   PROT 7.7 01/14/2022 1522   ALBUMIN 4.6 01/14/2022 1522   AST 18 01/14/2022 1522   ALT 11 01/14/2022 1522   ALKPHOS  60 01/14/2022 1522   BILITOT 0.4 01/14/2022 1522   GFRNONAA 92.72 08/17/2009 1022   GFRAA 113 08/03/2007 1638       Component Value Date/Time   WBC 5.5 01/14/2022 1522   RBC 4.11 01/14/2022 1522   HGB 13.0 01/14/2022 1522   HCT 38.7 01/14/2022 1522   PLT 220.0 01/14/2022 1522   MCV 94.2 01/14/2022 1522  MCH 31.6 04/27/2020 1446   MCHC 33.5 01/14/2022 1522   RDW 13.9 01/14/2022 1522   LYMPHSABS 1.5 07/29/2014 1117   MONOABS 0.5 07/29/2014 1117   EOSABS 0.1 07/29/2014 1117   BASOSABS 0.0 07/29/2014 1117    No results found for: "POCLITH", "LITHIUM"   No results found for: "PHENYTOIN", "PHENOBARB", "VALPROATE", "CBMZ"   .res Assessment: Plan:    Plan:  PDMP reviewed  Ativan '1mg'$  - take one and 1/2 tabs daily as needed. Zoloft '100mg'$  daily Decrease Wellbutrin XL '300mg'$  to '150mg'$  every morning - tremors.   Time spent with patient was 25 minutes. Greater than 50% of face to face time with patient was spent on counseling and coordination of care.    Counseled patient regarding potential benefits, risks, and side effects of Lamictal to include potential risk of Stevens-Johnson syndrome. Advised patient to stop taking Lamictal and contact office immediately if rash develops and to seek urgent medical attention if rash is severe and/or spreading quickly.   Discussed potential benefits, risk, and side effects of benzodiazepines to include potential risk of tolerance and dependence, as well as possible drowsiness. Advised patient not to drive if experiencing drowsiness and to take lowest possible effective dose to minimize risk of dependence and tolerance.   RTC 4 weeks  Patient advised to contact office with any questions, adverse effects, or acute worsening in signs and symptoms.  Diagnoses and all orders for this visit:  Bipolar II disorder ([place])  Major depressive disorder, recurrent episode, moderate (HCC)  Generalized anxiety disorder -     LORazepam (ATIVAN) 1 MG tablet;  TAKE 1 TO 2 TABLETS(1 TO 2 MG) BY MOUTH AT BEDTIME  Insomnia, unspecified type     Please see After Visit Summary for patient specific instructions.  Future Appointments  Date Time Provider [place]  09/04/2022 10:20 AM Hoyt Koch, MD [facility] None    No orders of the defined types were placed in this encounter.   -------------------------------

## 2022-09-04 ENCOUNTER — Ambulatory Visit: Payer: Medicare HMO | Admitting: Internal Medicine

## 2022-09-06 ENCOUNTER — Other Ambulatory Visit: Payer: Self-pay | Admitting: Obstetrics & Gynecology

## 2022-09-06 ENCOUNTER — Other Ambulatory Visit: Payer: Self-pay

## 2022-09-06 MED ORDER — TRAMADOL HCL 50 MG PO TABS: 50.0000 mg | ORAL_TABLET | Freq: Every day | ORAL | 1 refills | Status: DC | PRN

## 2022-09-09 NOTE — Telephone Encounter (Signed)
Med refill request:Fosamax 70 mg tab Last AEX: 08/15/22/ ML Next AEX: Not scheduled  Last MMG (if hormonal med) N/A Refill authorized: Please Advise?   Rx refused. Rx sent 08/15/22 #12/4RF  Refill not appropriate.   Routing to Dr. Ileene Musa.   Encounter closed.

## 2022-09-17 ENCOUNTER — Other Ambulatory Visit: Payer: Self-pay | Admitting: Internal Medicine

## 2022-09-17 ENCOUNTER — Other Ambulatory Visit: Payer: Self-pay | Admitting: Obstetrics & Gynecology

## 2022-09-17 ENCOUNTER — Other Ambulatory Visit: Payer: Self-pay

## 2022-09-17 DIAGNOSIS — M810 Age-related osteoporosis without current pathological fracture: Secondary | ICD-10-CM

## 2022-09-17 DIAGNOSIS — Z8619 Personal history of other infectious and parasitic diseases: Secondary | ICD-10-CM

## 2022-09-17 MED ORDER — VALACYCLOVIR HCL 500 MG PO TABS: ORAL_TABLET | ORAL | 2 refills | Status: DC

## 2022-09-17 NOTE — Telephone Encounter (Signed)
Pt requesting refills for PRN valacyclovir for outbreaks. Pt reports telling you she didn't need it when she came for annual in 07/2022 due to not having a breakout in years. However, has had two since her visit, feels as if due to increased stress.

## 2022-09-18 NOTE — Telephone Encounter (Signed)
This is the 3rd refill request we have received for this medication from the same pharmacy that 1 years worth of medication was sent to on 08/15/2022--will give the pharmacy a call.

## 2022-09-18 NOTE — Telephone Encounter (Signed)
Spoke with pharmacy, they reported that they didn't have the medication on file. I even had them check for another profile on pt. They denied another profile. Provided pharmacist with verbal order for #12 w/ 4 refills.

## 2022-09-28 ENCOUNTER — Other Ambulatory Visit: Payer: Self-pay | Admitting: Adult Health

## 2022-09-28 DIAGNOSIS — F331 Major depressive disorder, recurrent, moderate: Secondary | ICD-10-CM

## 2022-10-01 NOTE — Telephone Encounter (Signed)
Verify dose, last note says reduce to 150 mg

## 2022-10-02 NOTE — Telephone Encounter (Signed)
LVM to [facility] 

## 2022-10-10 NOTE — Telephone Encounter (Signed)
Left another VM to [facility], but she has an appt with Barnett Applebaum tomorrow.

## 2022-10-11 ENCOUNTER — Encounter: Payer: Self-pay | Admitting: Adult Health

## 2022-10-11 ENCOUNTER — Ambulatory Visit: Payer: Medicare HMO | Admitting: Adult Health

## 2022-10-11 DIAGNOSIS — G4700 Insomnia, unspecified: Secondary | ICD-10-CM | POA: Diagnosis not present

## 2022-10-11 DIAGNOSIS — F411 Generalized anxiety disorder: Secondary | ICD-10-CM | POA: Diagnosis not present

## 2022-10-11 DIAGNOSIS — F331 Major depressive disorder, recurrent, moderate: Secondary | ICD-10-CM

## 2022-10-11 DIAGNOSIS — F3181 Bipolar II disorder: Secondary | ICD-10-CM | POA: Diagnosis not present

## 2022-10-11 MED ORDER — SERTRALINE HCL 100 MG PO TABS: ORAL_TABLET | ORAL | 3 refills | Status: DC

## 2022-10-11 NOTE — Progress Notes (Addendum)
Monique [name] [id number] July 02, 1956 67 y.o.  Subjective:   Patient ID:  Steward Drone. Brune is a 67 y.o. (DOB 1956/07/31) female.  Chief Complaint: No chief complaint on file.   HPI Ivonna Kinnick. Rawl presents to the office today for follow-up of MDD, GAD, insomnia and BPD2.  Describes mood today as "ok" Pleasant. Tearful at times. Mood symptoms - reports depression and anxiety. Denies irritability. Reports some worry and rumination. Reports some over thinking. Mood is lower. Stating "I'm doing ok". Feels like the Wellbutrin is too activating and would like to discontinue it. Would like to try increasing the Zoloft from '100mg'$  to '150mg'$  daily. She and husband doing well. Stable interest and motivation. Taking medications as prescribed. Energy levels lower. Active, does not have a regular exercise routine - has started walking. Enjoys some usual interests and activities. Married. Lives with husband. Family local. Spending time with family. Appetite adequate. Weight stable - 129 pounds - 67". Sleeps well most nights. Averages 8 to 9 hours. Focus and concentration difficulties. Completing tasks. Managing aspects of household. Working part-time.  Denies SI or HI.  Denies AH or VH.  Previous medication trials:  Denies   GAD-7    Flowsheet Row Office Visit from 04/06/2019 in [facility]  Total GAD-7 Score 8      PHQ2-9    [place] Office Visit from 04/27/2020 in [facility] [place] Visit from 04/06/2019 in [facility] Visit from 10/09/2017 in [facility] Visit from 09/25/2016 in [facility] Care at [facility] Total Score 2 2 0 0  PHQ-9 Total Score 7 9 -- --        Review of Systems:  Review of Systems  Musculoskeletal:  Negative for gait problem.  Neurological:  Negative for tremors.  Psychiatric/Behavioral:         Please refer to HPI    Medications: I  have reviewed the patient's current medications.  Current Outpatient Medications  Medication Sig Dispense Refill   alendronate (FOSAMAX) 70 MG tablet TAKE 1 TABLET(70 MG) BY MOUTH EVERY 7 DAYS WITH A FULL GLASS OF WATER AND ON AN EMPTY STOMACH 12 tablet 4   Ascorbic Acid (VITAMIN C PO) Take by mouth.     azelastine (ASTELIN) 0.1 % nasal spray SMARTSIG:1-2 Spray(s) Both Nares Twice Daily PRN     Biotin w/ Vitamins C & E (HAIR/SKIN/NAILS PO) Take by mouth.     Calcium Carbonate-Vit D-Min (CALCIUM 1200 PO) Take by mouth. Take 1 tab once daily.     ketorolac (ACULAR) 0.5 % ophthalmic solution Place 1 drop into the right eye 4 (four) times daily.     levocetirizine (XYZAL) 5 MG tablet SMARTSIG:1 Tablet(s) By Mouth Every Evening     LORazepam (ATIVAN) 1 MG tablet TAKE 1 TO 2 TABLETS(1 TO 2 MG) BY MOUTH AT BEDTIME 60 tablet 2   MELATONIN PO Take 2 mg by mouth.     metoprolol tartrate (LOPRESSOR) 50 MG tablet Take 1 tablet (50 mg total) by mouth 2 (two) times daily. (Patient taking differently: Take 50 mg by mouth as needed.) 180 tablet 3   sertraline (ZOLOFT) 100 MG tablet Take one and 1/2 tablets daily. 135 tablet 3   sodium chloride (OCEAN) 0.65 % SOLN nasal spray Place 1 spray into both nostrils as needed for congestion.     traMADol (ULTRAM) 50 MG tablet Take 1 tablet (50 mg total) by  mouth daily as needed. 15 tablet 1   valACYclovir (VALTREX) 500 MG tablet Take 1 tab po BID for 3-5 days prn for an outbreak. 30 tablet 2   No current facility-administered medications for this visit.    Medication Side Effects: None  Allergies: No Known Allergies  Past Medical History:  Diagnosis Date   Abnormal electrocardiogram    Allergic rhinitis    Allergy    seasonal allergies   Anxiety    on meds   Arthritis    R hip and left thumb   Bipolar disorder (HCC)    Cancer (HCC)    basal skin cell CA-1979   Cataracts, bilateral    COVID    Depression    some- on meds   GERD (gastroesophageal  reflux disease)    not on meds at this time   History of hepatitis C    Hep B also   History of skin cancer 1980   HSV infection    Memory loss    MVP (mitral valve prolapse)    Osteoporosis    Paresthesia    Unspecified chronic bronchitis (HCC)     Past Medical History, Surgical history, Social history, and Family history were reviewed and updated as appropriate.   Please see review of systems for further details on the patient's review from today.   Objective:   Physical Exam:  There were no vitals taken for this visit.  Physical Exam  Lab Review:     Component Value Date/Time   NA 138 01/14/2022 1522   K 4.7 01/14/2022 1522   CL 102 01/14/2022 1522   CO2 28 01/14/2022 1522   GLUCOSE 87 01/14/2022 1522   BUN 14 01/14/2022 1522   CREATININE 0.79 01/14/2022 1522   CREATININE 0.76 04/27/2020 1446   CALCIUM 9.4 01/14/2022 1522   PROT 7.7 01/14/2022 1522   ALBUMIN 4.6 01/14/2022 1522   AST 18 01/14/2022 1522   ALT 11 01/14/2022 1522   ALKPHOS 60 01/14/2022 1522   BILITOT 0.4 01/14/2022 1522   GFRNONAA 92.72 08/17/2009 1022   GFRAA 113 08/03/2007 1638       Component Value Date/Time   WBC 5.5 01/14/2022 1522   RBC 4.11 01/14/2022 1522   HGB 13.0 01/14/2022 1522   HCT 38.7 01/14/2022 1522   PLT 220.0 01/14/2022 1522   MCV 94.2 01/14/2022 1522   MCH 31.6 04/27/2020 1446   MCHC 33.5 01/14/2022 1522   RDW 13.9 01/14/2022 1522   LYMPHSABS 1.5 07/29/2014 1117   MONOABS 0.5 07/29/2014 1117   EOSABS 0.1 07/29/2014 1117   BASOSABS 0.0 07/29/2014 1117    No results found for: "POCLITH", "LITHIUM"   No results found for: "PHENYTOIN", "PHENOBARB", "VALPROATE", "CBMZ"   .res Assessment: Plan:    Plan:  PDMP reviewed  Ativan '1mg'$  - take one and 1/2 tabs daily as needed. Increase Zoloft '100mg'$  to '150mg'$  daily Discontinue Wellbutrin XL '150mg'$  every morning - tremors.   Time spent with patient was 25 minutes. Greater than 50% of face to face time with patient was  spent on counseling and coordination of care.    Counseled patient regarding potential benefits, risks, and side effects of Lamictal to include potential risk of Stevens-Johnson syndrome. Advised patient to stop taking Lamictal and contact office immediately if rash develops and to seek urgent medical attention if rash is severe and/or spreading quickly.   Discussed potential benefits, risk, and side effects of benzodiazepines to include potential risk of tolerance and dependence,  as well as possible drowsiness. Advised patient not to drive if experiencing drowsiness and to take lowest possible effective dose to minimize risk of dependence and tolerance.   RTC 6 weeks  Diagnoses and all orders for this visit:  Bipolar II disorder (HCC)  Generalized anxiety disorder -     sertraline (ZOLOFT) 100 MG tablet; Take one and 1/2 tablets daily.  Insomnia, unspecified type     Please see After Visit Summary for patient specific instructions.  No future appointments.   No orders of the defined types were placed in this encounter.   -------------------------------

## 2022-10-23 NOTE — Addendum Note (Signed)
Addended by: Aloha Gell on: 10/23/2022 01:50 PM   Modules accepted: Level of Service

## 2022-11-06 ENCOUNTER — Other Ambulatory Visit: Payer: Self-pay | Admitting: Adult Health

## 2022-11-06 DIAGNOSIS — F331 Major depressive disorder, recurrent, moderate: Secondary | ICD-10-CM

## 2022-12-10 ENCOUNTER — Encounter: Payer: Self-pay | Admitting: Physician Assistant

## 2023-01-02 ENCOUNTER — Other Ambulatory Visit: Payer: Self-pay

## 2023-01-02 DIAGNOSIS — F411 Generalized anxiety disorder: Secondary | ICD-10-CM

## 2023-01-02 MED ORDER — LORAZEPAM 1 MG PO TABS: ORAL_TABLET | ORAL | 0 refills | Status: DC

## 2023-01-15 ENCOUNTER — Telehealth: Payer: Self-pay

## 2023-01-15 NOTE — Telephone Encounter (Signed)
Called patient to schedule Medicare Annual Wellness Visit (AWV). No voicemail available to leave a message.  Last date of AWV: 01/14/22  Please schedule an appointment at any time on Annual Wellness Schedule.

## 2023-01-21 ENCOUNTER — Ambulatory Visit (INDEPENDENT_AMBULATORY_CARE_PROVIDER_SITE_OTHER): Payer: Self-pay | Admitting: Adult Health

## 2023-01-21 ENCOUNTER — Ambulatory Visit: Payer: Medicare HMO | Admitting: Physician Assistant

## 2023-01-21 DIAGNOSIS — Z0389 Encounter for observation for other suspected diseases and conditions ruled out: Secondary | ICD-10-CM

## 2023-01-21 NOTE — Progress Notes (Signed)
Patient no show appointment. ? ?

## 2023-01-22 ENCOUNTER — Encounter: Payer: Self-pay | Admitting: Adult Health

## 2023-01-22 ENCOUNTER — Ambulatory Visit: Payer: Medicare HMO | Admitting: Adult Health

## 2023-01-22 DIAGNOSIS — G4700 Insomnia, unspecified: Secondary | ICD-10-CM

## 2023-01-22 DIAGNOSIS — F3181 Bipolar II disorder: Secondary | ICD-10-CM

## 2023-01-22 DIAGNOSIS — F411 Generalized anxiety disorder: Secondary | ICD-10-CM | POA: Diagnosis not present

## 2023-01-22 NOTE — Progress Notes (Signed)
Monique [name] [id number] [birth year] 67 y.o.  Subjective:   Patient ID:  Monique [name]. Monique [name] is a 67 y.o. (DOB 24-Apr-1956) female.  Chief Complaint: No chief complaint on file.   HPI Monique [name]. Monique [name] presents to the office today for follow-up of GAD, insomnia and BPD2.  Describes mood today as "ok" Monique [name]. Tearful at times. Mood symptoms - denies depression, anxiety. and irritability. Reports decreased worry, rumination, and over thinking. Mood is consistent. Stating "I'm doing very good". Feels like her current medication regimen works well. She and husband doing well. Stable interest and motivation. Taking medications as prescribed. Energy levels better over the past few weeks. Active, does not have a regular exercise routine - has started walking. Enjoys some usual interests and activities. Married. Lives with husband. Family local. Spending time with family. Appetite adequate. Weight stable - 129 pounds - 67". Sleeps well most nights. Averages 8 to 9 hours. Focus and concentration "a little better lately". Completing tasks. Managing aspects of household. Working part-time.  Denies SI or HI.  Denies AH or VH. Denies self harm. Denies substance use.  Previous medication trials:  Denies   GAD-7    Flowsheet Row Office Visit from 04/06/2019 in [facility]  Total GAD-7 Score 8      PHQ2-9    Flowsheet Row Office Visit from 04/27/2020 in [facility] Office Visit from 04/06/2019 in [facility] Office Visit from 10/09/2017 in [facility] Office Visit from 09/25/2016 in [facility] Care at [facility] Total Score 2 2 0 0  PHQ-9 Total Score 7 9 -- --        Review of Systems:  Review of Systems  Psychiatric/Behavioral:         Please refer to HPI    Medications: I have reviewed the patient's current medications.  Current Outpatient Medications  Medication Sig  Dispense Refill   alendronate (FOSAMAX) 70 MG tablet TAKE 1 TABLET(70 MG) BY MOUTH EVERY 7 DAYS WITH A FULL GLASS OF WATER AND ON AN EMPTY STOMACH 12 tablet 4   Ascorbic Acid (VITAMIN C PO) Take by mouth.     azelastine (ASTELIN) 0.1 % nasal spray SMARTSIG:1-2 Spray(s) Both Nares Twice Daily PRN     Biotin w/ Vitamins C & E (HAIR/SKIN/NAILS PO) Take by mouth.     Calcium Carbonate-Vit D-Min (CALCIUM 1200 PO) Take by mouth. Take 1 tab once daily.     ketorolac (ACULAR) 0.5 % ophthalmic solution Place 1 drop into the right eye 4 (four) times daily.     levocetirizine (XYZAL) 5 MG tablet SMARTSIG:1 Tablet(s) By Mouth Every Evening     LORazepam (ATIVAN) 1 MG tablet TAKE 1 TO 2 TABLETS(1 TO 2 MG) BY MOUTH AT BEDTIME 60 tablet 0   MELATONIN PO Take 2 mg by mouth.     metoprolol tartrate (LOPRESSOR) 50 MG tablet Take 1 tablet (50 mg total) by mouth 2 (two) times daily. (Patient taking differently: Take 50 mg by mouth as needed.) 180 tablet 3   sertraline (ZOLOFT) 100 MG tablet Take one and 1/2 tablets daily. 135 tablet 3   sodium chloride (OCEAN) 0.65 % SOLN nasal spray Place 1 spray into both nostrils as needed for congestion.     traMADol (ULTRAM) 50 MG tablet Take 1 tablet (50 mg total) by mouth daily as needed. 15 tablet 1   valACYclovir (VALTREX) 500 MG tablet Take 1 tab po BID  for 3-5 days prn for an outbreak. 30 tablet 2   No current facility-administered medications for this visit.    Medication Side Effects: None  Allergies: No Known Allergies  Past Medical History:  Diagnosis Date   Abnormal electrocardiogram    Allergic rhinitis    Allergy    seasonal allergies   Anxiety    on meds   Arthritis    R hip and left thumb   Bipolar disorder (HCC)    Cancer (HCC)    basal skin cell CA-1979   Cataracts, bilateral    COVID    Depression    some- on meds   GERD (gastroesophageal reflux disease)    not on meds at this time   History of hepatitis C    Hep B also   History of  skin cancer 1980   HSV infection    Memory loss    MVP (mitral valve prolapse)    Osteoporosis    Paresthesia    Unspecified chronic bronchitis (HCC)     Past Medical History, Surgical history, Social history, and Family history were reviewed and updated as appropriate.   Please see review of systems for further details on the patient's review from today.   Objective:   Physical Exam:  There were no vitals taken for this visit.  Physical Exam  Lab Review:     Component Value Date/Time   NA 138 01/14/2022 1522   K 4.7 01/14/2022 1522   CL 102 01/14/2022 1522   CO2 28 01/14/2022 1522   GLUCOSE 87 01/14/2022 1522   BUN 14 01/14/2022 1522   CREATININE 0.79 01/14/2022 1522   CREATININE 0.76 04/27/2020 1446   CALCIUM 9.4 01/14/2022 1522   PROT 7.7 01/14/2022 1522   ALBUMIN 4.6 01/14/2022 1522   AST 18 01/14/2022 1522   ALT 11 01/14/2022 1522   ALKPHOS 60 01/14/2022 1522   BILITOT 0.4 01/14/2022 1522   GFRNONAA 92.72 08/17/2009 1022   GFRAA 113 08/03/2007 1638       Component Value Date/Time   WBC 5.5 01/14/2022 1522   RBC 4.11 01/14/2022 1522   HGB 13.0 01/14/2022 1522   HCT 38.7 01/14/2022 1522   PLT 220.0 01/14/2022 1522   MCV 94.2 01/14/2022 1522   MCH 31.6 04/27/2020 1446   MCHC 33.5 01/14/2022 1522   RDW 13.9 01/14/2022 1522   LYMPHSABS 1.5 07/29/2014 1117   MONOABS 0.5 07/29/2014 1117   EOSABS 0.1 07/29/2014 1117   BASOSABS 0.0 07/29/2014 1117    No results found for: "POCLITH", "LITHIUM"   No results found for: "PHENYTOIN", "PHENOBARB", "VALPROATE", "CBMZ"   .res Assessment: Plan:    Plan:  PDMP reviewed  Ativan 1mg  - take one and 1/2 tabs daily as needed. Zoloft 150mg  daily  Time spent with patient was 25 minutes. Greater than 50% of face to face time with patient was spent on counseling and coordination of care.    Counseled patient regarding potential benefits, risks, and side effects of Lamictal to include potential risk of  Stevens-Johnson syndrome. Advised patient to stop taking Lamictal and contact office immediately if rash develops and to seek urgent medical attention if rash is severe and/or spreading quickly.   Discussed potential benefits, risk, and side effects of benzodiazepines to include potential risk of tolerance and dependence, as well as possible drowsiness. Advised patient not to drive if experiencing drowsiness and to take lowest possible effective dose to minimize risk of dependence and tolerance.   RTC 6  months  There are no diagnoses linked to this encounter.   Please see After Visit Summary for patient specific instructions.  Future Appointments  Date Time Provider Department Center  01/22/2023  5:00 PM Yerachmiel Spinney, Thereasa Solo, NP [facility] None  02/25/2023  2:30 PM Doree Albee, PA-C [facility]    No orders of the defined types were placed in this encounter.   -------------------------------

## 2023-02-02 ENCOUNTER — Other Ambulatory Visit: Payer: Self-pay | Admitting: Adult Health

## 2023-02-02 DIAGNOSIS — F411 Generalized anxiety disorder: Secondary | ICD-10-CM

## 2023-02-25 ENCOUNTER — Ambulatory Visit: Payer: Medicare HMO | Admitting: Physician Assistant

## 2023-02-27 ENCOUNTER — Encounter: Payer: Self-pay | Admitting: Obstetrics & Gynecology

## 2023-02-27 ENCOUNTER — Ambulatory Visit (INDEPENDENT_AMBULATORY_CARE_PROVIDER_SITE_OTHER): Payer: Medicare HMO | Admitting: Obstetrics & Gynecology

## 2023-02-27 VITALS — BP 114/70 | HR 63 | Wt 126.0 lb

## 2023-02-27 DIAGNOSIS — K629 Disease of anus and rectum, unspecified: Secondary | ICD-10-CM | POA: Diagnosis not present

## 2023-02-27 DIAGNOSIS — Z8619 Personal history of other infectious and parasitic diseases: Secondary | ICD-10-CM

## 2023-02-27 DIAGNOSIS — A6000 Herpesviral infection of urogenital system, unspecified: Secondary | ICD-10-CM | POA: Diagnosis not present

## 2023-02-27 MED ORDER — VALACYCLOVIR HCL 500 MG PO TABS: ORAL_TABLET | ORAL | 4 refills | Status: DC

## 2023-02-27 MED ORDER — ACYCLOVIR 5 % EX CREA: 1.0000 | TOPICAL_CREAM | CUTANEOUS | 3 refills | Status: DC

## 2023-02-27 NOTE — Progress Notes (Signed)
    Monique [name] June 10, 1956 [id number]        67 y.o.  G0  RP: Perianal lesion/recurrent genital HSV  HPI: Perianal lesion felt a couple of weeks ago.  Not painful, not felt today.  C/O frequent recurrences of genital HSV.  Currently abstinent. No pelvic pain.  No PMB.   OB History  Gravida Para Term Preterm AB Living  0 0 0 0 0 0  SAB IAB Ectopic Multiple Live Births  0 0 0 0 0    Past medical history,surgical history, problem list, medications, allergies, family history and social history were all reviewed and documented in the EPIC chart.   Directed ROS with pertinent positives and negatives documented in the history of present illness/assessment and plan.  Exam:  Vitals:   02/27/23 1328  BP: 114/70  Pulse: 63  SpO2: 98%  Weight: 126 lb (57.2 kg)   General appearance:  Normal  Gynecologic exam: Vulva and perianal area normal.   Assessment/Plan:  67 y.o. G0P0000   1. Recurrent genital herpes Perianal lesion felt a couple of weeks ago.  Not painful, not felt today.  C/O frequent recurrences of genital HSV.  Patient treating with Valacyclovir PO for recurrences and Zovirax cream.  Currently abstinent. No pelvic pain.  No PMB.  Decision to start on Valacyclovir prophylaxis 500 mg PO daily.  Usage reviewed, prescription sent to pharmacy.  If develops a recurrence, will double the Valacyclovir x 5 days and use Zovirax cream.  2. Perianal lesion Normal perianal area, no lesion present.  3. H/O herpes genitalis - valACYclovir (VALTREX) 500 MG tablet; Prophylaxis with 1 tab PO daily - acyclovir cream (ZOVIRAX) 5 %; Apply 1 Application topically every 3 (three) hours.   Genia Del MD, 1:57 PM 02/27/2023

## 2023-04-25 ENCOUNTER — Encounter: Payer: Self-pay | Admitting: Internal Medicine

## 2023-04-25 ENCOUNTER — Ambulatory Visit (INDEPENDENT_AMBULATORY_CARE_PROVIDER_SITE_OTHER): Payer: Medicare HMO | Admitting: Internal Medicine

## 2023-04-25 VITALS — BP 112/82 | HR 62 | Temp 98.2°F | Ht 66.25 in | Wt 125.0 lb

## 2023-04-25 DIAGNOSIS — Z8619 Personal history of other infectious and parasitic diseases: Secondary | ICD-10-CM

## 2023-04-25 DIAGNOSIS — H9190 Unspecified hearing loss, unspecified ear: Secondary | ICD-10-CM | POA: Insufficient documentation

## 2023-04-25 DIAGNOSIS — Z0001 Encounter for general adult medical examination with abnormal findings: Secondary | ICD-10-CM

## 2023-04-25 DIAGNOSIS — R9431 Abnormal electrocardiogram [ECG] [EKG]: Secondary | ICD-10-CM | POA: Diagnosis not present

## 2023-04-25 DIAGNOSIS — Z1283 Encounter for screening for malignant neoplasm of skin: Secondary | ICD-10-CM

## 2023-04-25 DIAGNOSIS — F319 Bipolar disorder, unspecified: Secondary | ICD-10-CM | POA: Diagnosis not present

## 2023-04-25 DIAGNOSIS — R413 Other amnesia: Secondary | ICD-10-CM | POA: Diagnosis not present

## 2023-04-25 DIAGNOSIS — Z0000 Encounter for general adult medical examination without abnormal findings: Secondary | ICD-10-CM

## 2023-04-25 LAB — COMPREHENSIVE METABOLIC PANEL
ALT: 12 U/L (ref 0–35)
AST: 22 U/L (ref 0–37)
Albumin: 4.6 g/dL (ref 3.5–5.2)
Alkaline Phosphatase: 63 U/L (ref 39–117)
BUN: 16 mg/dL (ref 6–23)
CO2: 31 mEq/L (ref 19–32)
Calcium: 9.6 mg/dL (ref 8.4–10.5)
Chloride: 101 mEq/L (ref 96–112)
Creatinine, Ser: 0.79 mg/dL (ref 0.40–1.20)
GFR: 77.32 mL/min (ref >60.00)
Glucose, Bld: 87 mg/dL (ref 70–99)
Potassium: 4.5 mEq/L (ref 3.5–5.1)
Sodium: 139 mEq/L (ref 135–145)
Total Bilirubin: 0.4 mg/dL (ref 0.2–1.2)
Total Protein: 7.8 g/dL (ref 6.0–8.3)

## 2023-04-25 LAB — LIPID PANEL
Cholesterol: 193 mg/dL (ref 0–200)
HDL: 80.4 mg/dL (ref >39.00)
LDL Cholesterol: 96 mg/dL (ref 0–99)
NonHDL: 112.51
Total CHOL/HDL Ratio: 2
Triglycerides: 84 mg/dL (ref 0.0–149.0)
VLDL: 16.8 mg/dL (ref 0.0–40.0)

## 2023-04-25 LAB — CBC
HCT: 41.8 % (ref 36.0–46.0)
Hemoglobin: 13.4 g/dL (ref 12.0–15.0)
MCHC: 32.1 g/dL (ref 30.0–36.0)
MCV: 94.7 fl (ref 78.0–100.0)
Platelets: 236 10*3/uL (ref 150.0–400.0)
RBC: 4.42 Mil/uL (ref 3.87–5.11)
RDW: 15.2 % (ref 11.5–15.5)
WBC: 5.6 10*3/uL (ref 4.0–10.5)

## 2023-04-25 MED ORDER — TRAMADOL HCL 50 MG PO TABS: 50.0000 mg | ORAL_TABLET | Freq: Every day | ORAL | 1 refills | Status: DC | PRN

## 2023-04-25 MED ORDER — METOPROLOL TARTRATE 50 MG PO TABS: 50.0000 mg | ORAL_TABLET | Freq: Two times a day (BID) | ORAL | 3 refills | Status: DC

## 2023-04-25 NOTE — Assessment & Plan Note (Signed)
Referral to audiology for hearing assessment.

## 2023-04-25 NOTE — Progress Notes (Signed)
Subjective:   Patient ID: Monique [name]. Mazza, female    DOB: 01-08-1956, 67 y.o.   MRN: [id number]  HPI Here for medicare wellness and physical, with new complaints about memory change. Please see A/P for status and treatment of chronic medical problems.   Diet: heart healthy  Physical activity: sedentary, active still working some Depression/mood screen: negative Hearing: intact to whispered voice Visual acuity: needs cataracts done so not ideal, performs annual eye exam  ADLs: capable Fall risk: none Home safety: good Cognitive evaluation: intact to orientation, naming, recall and repetition EOL planning: adv directives discussed  Flowsheet Row Office Visit from 04/25/2023 in [facility] [place]  PHQ-2 Total Score 2       Flowsheet Row Office Visit from 04/25/2023 in [facility] [place]  PHQ-9 Total Score 14         10/09/2017    2:01 PM 06/28/2020    1:17 PM 01/14/2022    2:34 PM 02/27/2023    1:41 PM 04/25/2023    1:44 PM  Fall Risk  Falls in the past year? No  0 0 0  Was there an injury with Fall?   0 0 0  Fall Risk Category Calculator   0 0 0  Fall Risk Category (Retired)   Low    (RETIRED) Patient Fall Risk Level  Low fall risk     Patient at Risk for Falls Due to    No Fall Risks   Fall risk Follow up    Falls evaluation completed Falls evaluation completed    I have personally reviewed and have noted 1. The patient's medical and social history - reviewed today no changes 2. Their use of alcohol, tobacco or illicit drugs 3. Their current medications and supplements 4. The patient's functional ability including ADL's, fall risks, home safety risks and hearing or visual impairment. 5. Diet and physical activities 6. Evidence for depression or mood disorders 7. Care team reviewed and updated 8.  The patient is on an opioid pain medication tramadol which she uses rarely (less than once a month) and this was  reviewed with patient and non-opioid pain medication options were reviewed and offered to patient, their pain treatment plan and severity was discussed with them. We have considered referrals as appropriate for patient. Opioid risk factors were also considered and reviewed.   Patient Care Team: Myrlene Broker, MD as PCP - General (Internal Medicine) [name], MD (Inactive) as Consulting Physician (Dermatology) Past Medical History:  Diagnosis Date   Abnormal electrocardiogram    Allergic rhinitis    Allergy    seasonal allergies   Anxiety    on meds   Arthritis    R hip and left thumb   Bipolar disorder (HCC)    Cancer (HCC)    basal skin cell CA-1979   Cataracts, bilateral    COVID    Depression    some- on meds   GERD (gastroesophageal reflux disease)    not on meds at this time   History of hepatitis C    Hep B also   History of skin cancer 1980   HSV infection    Memory loss    MVP (mitral valve prolapse)    Osteoporosis    Paresthesia    Unspecified chronic bronchitis (HCC)    Past Surgical History:  Procedure Laterality Date   APPENDECTOMY  11/08   COLONOSCOPY  2014   MOHS SURGERY  RHINOPLASTY     TUBAL LIGATION     bilateral   Family History  Problem Relation Age of Onset   COPD Mother    Allergies Mother    Heart disease Mother    Atrial fibrillation Mother    Heart failure Mother    Colon cancer Mother 45       Had 6 inches, resected.   Colon polyps Mother 79   Allergies Father    Rheum arthritis Father    Bladder Cancer Father    Prostate cancer Father 32   Cancer - Other Father 60       Bladder   COPD Paternal Uncle    Pancreatic cancer Paternal Grandmother 45   Colon polyps Brother 80   Asthma Other    Esophageal cancer Neg Hx    Stomach cancer Neg Hx    Rectal cancer Neg Hx    Breast cancer Neg Hx      Review of Systems  Constitutional: Negative.   HENT:  Positive for hearing loss.   Eyes: Negative.   Respiratory:   Negative for cough, chest tightness and shortness of breath.   Cardiovascular:  Negative for chest pain, palpitations and leg swelling.  Gastrointestinal:  Negative for abdominal distention, abdominal pain, constipation, diarrhea, nausea and vomiting.  Musculoskeletal:  Positive for arthralgias.  Skin: Negative.   Neurological: Negative.   Psychiatric/Behavioral: Negative.      Objective:  Physical Exam Constitutional:      Appearance: She is well-developed.  HENT:     Head: Normocephalic and atraumatic.  Cardiovascular:     Rate and Rhythm: Normal rate and regular rhythm.  Pulmonary:     Effort: Pulmonary effort is normal. No respiratory distress.     Breath sounds: Normal breath sounds. No wheezing or rales.  Abdominal:     General: Bowel sounds are normal. There is no distension.     Palpations: Abdomen is soft.     Tenderness: There is no abdominal tenderness. There is no rebound.  Musculoskeletal:        General: Tenderness present.     Cervical back: Normal range of motion.  Skin:    General: Skin is warm and dry.  Neurological:     Mental Status: She is alert and oriented to person, place, and time.     Coordination: Coordination normal.    EKG: Rate 55, axis left, interval normal, sinus brady, stable st or t wave changes elevated ST in V3 borderline V4, inverted r lead II and III, no significant change compared to prior 2015  Vitals:   04/25/23 1341  BP: 112/82  Pulse: 62  Temp: 98.2 F (36.8 C)  TempSrc: Oral  SpO2: 97%  Weight: 125 lb (56.7 kg)  Height: 5' 6.25" (1.683 m)    Assessment & Plan:

## 2023-04-25 NOTE — Assessment & Plan Note (Signed)
Flu shot yearly. Pneumonia complete. Shingrix due at pharmacy. Tetanus due 2025. Colonoscopy due 2026. Mammogram due 2025, pap smear aged out and dexa complete. Counseled about sun safety and mole surveillance. Counseled about the dangers of distracted driving. Given 10 year screening recommendations.

## 2023-04-25 NOTE — Patient Instructions (Signed)
We will get the MRI of the brain.

## 2023-04-25 NOTE — Assessment & Plan Note (Signed)
Checking CMP.  

## 2023-04-25 NOTE — Assessment & Plan Note (Signed)
With symptoms worsening since covid-19 infection last few years. Last year vitamins were checked and normal. Ordered MRI brain today and adjust as needed.

## 2023-04-25 NOTE — Assessment & Plan Note (Signed)
Prior elevated ST segment and remote cardiology assessment which I cannot review. EKG done today for updated baseline which is not significantly changed. I believe she would benefit from updated cardiology referral and assessment.

## 2023-04-25 NOTE — Assessment & Plan Note (Signed)
Overall controlled and seeing behavioral health for medication management.  ?

## 2023-05-02 ENCOUNTER — Telehealth: Payer: Self-pay

## 2023-05-02 NOTE — Telephone Encounter (Signed)
Former ML pt LVM in triage line stating that for some reason, she is not able to pickup refill of valtrex from pharmacy.  Rx sent by ML on 02/27/2023 for #90 w/ 4 refills to preferred pharmacy.  Spoke w/ pharmacy and they confirmed that they received rx and will get ready for her.  While notifying pt of the common misconception of getting denied for refill request via online pharmacy or application-the phone disconnected. Tried calling pt back to complete advise by telling her that pharmacy confirmed that they received rx that was sent in May and will get ready for her. No answer, LDVM on machine per EMR.   Will route to provider for final review and close.

## 2023-05-06 DIAGNOSIS — Z7189 Other specified counseling: Secondary | ICD-10-CM | POA: Diagnosis not present

## 2023-05-06 DIAGNOSIS — D225 Melanocytic nevi of trunk: Secondary | ICD-10-CM | POA: Diagnosis not present

## 2023-05-06 DIAGNOSIS — L814 Other melanin hyperpigmentation: Secondary | ICD-10-CM | POA: Diagnosis not present

## 2023-05-06 DIAGNOSIS — D492 Neoplasm of unspecified behavior of bone, soft tissue, and skin: Secondary | ICD-10-CM | POA: Diagnosis not present

## 2023-05-06 DIAGNOSIS — Z85828 Personal history of other malignant neoplasm of skin: Secondary | ICD-10-CM | POA: Diagnosis not present

## 2023-05-06 DIAGNOSIS — L578 Other skin changes due to chronic exposure to nonionizing radiation: Secondary | ICD-10-CM | POA: Diagnosis not present

## 2023-05-06 DIAGNOSIS — L820 Inflamed seborrheic keratosis: Secondary | ICD-10-CM | POA: Diagnosis not present

## 2023-05-06 DIAGNOSIS — Z08 Encounter for follow-up examination after completed treatment for malignant neoplasm: Secondary | ICD-10-CM | POA: Diagnosis not present

## 2023-05-06 DIAGNOSIS — L821 Other seborrheic keratosis: Secondary | ICD-10-CM | POA: Diagnosis not present

## 2023-05-11 ENCOUNTER — Other Ambulatory Visit: Payer: Medicare HMO

## 2023-05-12 ENCOUNTER — Encounter: Payer: Self-pay | Admitting: Physician Assistant

## 2023-05-12 ENCOUNTER — Ambulatory Visit: Payer: Medicare HMO | Admitting: Physician Assistant

## 2023-05-12 VITALS — BP 124/70 | HR 61 | Ht 66.0 in | Wt 124.0 lb

## 2023-05-12 DIAGNOSIS — Z800 Family history of malignant neoplasm of digestive organs: Secondary | ICD-10-CM

## 2023-05-12 DIAGNOSIS — K638219 Small intestinal bacterial overgrowth, unspecified: Secondary | ICD-10-CM | POA: Diagnosis not present

## 2023-05-12 DIAGNOSIS — K219 Gastro-esophageal reflux disease without esophagitis: Secondary | ICD-10-CM | POA: Diagnosis not present

## 2023-05-12 DIAGNOSIS — K582 Mixed irritable bowel syndrome: Secondary | ICD-10-CM | POA: Diagnosis not present

## 2023-05-12 NOTE — Patient Instructions (Addendum)
PLEASE FOLLOW UP AS NEEDED.  First do a trial off milk/lactose products if you use them.  Add fiber like benefiber or citracel once a day   Can do trial of IBGard which is over the counter for AB pain- Take 1-2 capsules once a day for maintence or twice a day during a flare For IBS and peppermint oil.  Peppermint oil has been proven to be better than placebo for cramping for IBS Stop if it worsens heart burn or causes flushing of your face.  Ideally enteric coated peppermint oil capsules 2 a day is best but if you got the oil, you can use 0.62ml or 180 mg of pepperment oil up to 3 x a day.   if any worsening symptoms like blood in stool, weight loss, please call the office     FODMAP stands for fermentable oligo-, di-, mono-saccharides and polyols (1). These are the scientific terms used to classify groups of carbs that are difficult for our body to digest and that are notorious for triggering digestive symptoms like bloating, gas, loose stools and stomach pain.   You can try low FODMAP diet  - start with eliminating just one column at a time that you feel may be a trigger for you. - the table at the very bottom contains foods that are low in FODMAPs   Sometimes trying to eliminate the FODMAP's from your diet is difficult or tricky, if you are stuggling with trying to do the elimination diet you can try an enzyme.  There is a food enzymes that you sprinkle in or on your food that helps break down the FODMAP. You can read more about the enzyme by going to this site: https://fodzyme.com/   Can take pepcid as needed Avoid spicy and acidic foods Avoid fatty foods Limit your intake of coffee, tea, alcohol, and carbonated drinks Work to maintain a healthy weight Keep the head of the bed elevated at least 3 inches with blocks or a wedge pillow if you are having any nighttime symptoms Stay upright for 2 hours after eating Avoid meals and snacks three to four hours before  bedtime  Dysphagia precautions:  1. Take reflux medications 30+ minutes before food in the morning 2. Begin meals with warm beverage 3. Eat smaller more frequent meals 4. Eat slowly, taking small bites and sips 5. Alternate solids and liquids 6. Avoid foods/liquids that increase acid production 7. Sit upright during and for 30+ minutes after meals to facilitate esophageal clearing 8. All meats should be chopped finely.   If something gets hung in your esophagus and will not come up or go down, proceed to the emergency room.     No aleve, ibuprofen, goody powders, as these are antiinflammatories and can cause inflammation in your stomach, increase bleeding risk and cause ulcers.  You can talk with PCP about alternative pain options.  Can do tyelnol max 3000 mg a day, salon pas patches are over the counter and voltern gel is topical antiinflammatory that is safe.   Costochondritis  Costochondritis is irritation and swelling (inflammation) of the tissue that connects the ribs to the breastbone (sternum). This tissue is called cartilage. This condition causes pain in the front of the chest. The pain often starts slowly. It may be in more than one rib. What are the causes? The cause of this condition is not always known. It can come from stress on the sternum. The cause of this stress could be: Chest injury. Exercise or activity.  This may include lifting. Very bad coughing. What increases the risk? Being female. Being 58-91 years old. Starting a new exercise or work activity. Having low levels of vitamin D. Having a condition that makes you cough a lot. What are the signs or symptoms? Chest pain that: Starts slowly. It can be sharp or dull. Gets worse with deep breathing, coughing, or exercise. Gets better with rest. May be worse when you press on your ribs and breastbone. How is this treated? In most cases, this condition goes away on its own over time. You may need to take an  NSAID, such as ibuprofen. This can help reduce pain. You may also need to: Rest and stay away from activities that make pain worse. Put heat or ice on the area that hurts. Do exercises to stretch your chest muscles. If these treatments do not help, your doctor may inject a medicine to numb the area. This can help relieve the pain. Follow these instructions at home: Managing pain, stiffness, and swelling     If told, put ice on the painful area. To do this: Put ice in a plastic bag. Place a towel between your skin and the bag. Leave the ice on for 20 minutes, 2-3 times a day. If told, put heat on the affected area. Do this as often as told by your doctor. Use the heat source that your doctor recommends, such as a moist heat pack or a heating pad. Place a towel between your skin and the heat source. Leave the heat on for 20-30 minutes. If your skin turns bright red, take off the ice or heat right away to prevent skin damage. The risk of skin damage is higher if you cannot feel pain, heat, or cold. Activity Rest as told by your doctor. Do not do things that make your pain worse. This includes activities that use your chest, belly (abdomen), and side muscles. You may have to avoid lifting. Ask your doctor how much you can safely lift. Return to your normal activities when your doctor says that it is safe. General instructions Take over-the-counter and prescription medicines only as told by your doctor. Contact a doctor if: You have chills or a fever. Your pain does not go away or gets worse. You have a cough that does not go away. Get help right away if: You have a hard time breathing. You have very bad chest pain that does not get better with medicines, heat, or ice. These symptoms may be an emergency. Get help right away. Call 911. Do not wait to see if the symptoms will go away. Do not drive yourself to the hospital. This information is not intended to replace advice given to you by  your health care provider. Make sure you discuss any questions you have with your health care provider. Document Revised: 04/04/2022 Document Reviewed: 04/04/2022 Elsevier Patient Education  2024 Elsevier Inc.  _______________________________________________________  If your blood pressure at your visit was 140/90 or greater, please contact your primary care physician to follow up on this.  _______________________________________________________  If you are age 29 or older, your body mass index should be between 23-30. Your Body mass index is 20.01 kg/m. If this is out of the aforementioned range listed, please consider follow up with your Primary Care Provider.  If you are age 40 or younger, your body mass index should be between 19-25. Your Body mass index is 20.01 kg/m. If this is out of the aformentioned range listed, please consider follow  up with your Primary Care Provider.   ________________________________________________________  The [facility] providers would like to encourage you to use [facility] to communicate with providers for non-urgent requests or questions.  Due to long hold times on the telephone, sending your provider a message by [facility] may be a faster and more efficient way to get a response.  Please allow 48 business hours for a response.  Please remember that this is for non-urgent requests.  _______________________________________________________ It was a pleasure to see you today!  Thank you for trusting me with your gastrointestinal care!

## 2023-05-12 NOTE — Progress Notes (Signed)
05/12/2023 Monique [name] [id number] 07-Mar-1956  Referring provider: Myrlene Broker, * Primary GI doctor: Dr. Leone Payor  ASSESSMENT AND PLAN:   Irritable bowel syndrome with both constipation and diarrhea Improved with fiber, continue this No red flag symptoms Recall colon 2026 Given FODMAP, can do ibgard as needed.   Gastro-esophageal reflux disease without esophagitis Lifestyle changes discussed, avoid NSAIDS, ETOH No symptoms at this time  Small intestinal bacterial overgrowth No bloating at this time, Can consider re treatment if worsening symptoms  Family history of colon cancer Monique [name] with CA 60's Normal colon other than small polyp, recall 5 years.  2026   Patient Care Team: Myrlene Broker, MD as PCP - General (Internal Medicine) Monique Harder, MD (Inactive) as Consulting Physician (Dermatology)  HISTORY OF PRESENT ILLNESS: 67 y.o. female with a past medical history of bipolar, depression, GERD, history of hep C/Hep B, MVP, osteoporosis, and others listed below presents for evaluation of IBS.   2013 EGD for dysphagia with empiric dilation 07/27/2017 positive hydrogen breath test, given Xifaxan for 14 days 01/26/2018 CT abdomen pelvis for abdominal discomfort bloating and weight loss was unremarkable 07/07/2020 colonoscopy with Dr. Christella Hartigan for Monique [name] with colon cancer in her 14s, bowel prep good, 2 mm polyp in rectum otherwise unremarkable.  Recall 5 years due to family history or 06/2025 04/25/2023 CBC kidney and liver unremarkable  She retired and doing well.  She was referred in August 2023 for IBS and bloating with flatulence.  She states there was a time or two she would have severe AB pain with urgency and diarrhea but she had switched jobs and never came in.  She has BM once a 1-2 a day and formed, but can alternate between hard and loose, bristol 1 or 2, versus loose 6.  She has had AB discomfort and bloating but her stools and discomfort  has improved in last 3 months with addition of metamucil.  No melena, no hematochezia.  She can have GERD occ, some mucus in her throat in the morning, no dysphagia.   She denies NSAID use.  She reports ETOH use, rare.  She denies tobacco use.  She denies drug use.    She  reports that she quit smoking about 26 years ago. Her smoking use included cigarettes. She started smoking about 51 years ago. She has a 37.5 pack-year smoking history. She has never used smokeless tobacco. She reports current alcohol use. She reports that she does not use drugs.  RELEVANT LABS AND IMAGING: CBC    Component Value Date/Time   WBC 5.6 04/25/2023 1454   RBC 4.42 04/25/2023 1454   HGB 13.4 04/25/2023 1454   HCT 41.8 04/25/2023 1454   PLT 236.0 04/25/2023 1454   MCV 94.7 04/25/2023 1454   MCH 31.6 04/27/2020 1446   MCHC 32.1 04/25/2023 1454   RDW 15.2 04/25/2023 1454   LYMPHSABS 1.5 07/29/2014 1117   MONOABS 0.5 07/29/2014 1117   EOSABS 0.1 07/29/2014 1117   BASOSABS 0.0 07/29/2014 1117   Recent Labs    04/25/23 1454  HGB 13.4    CMP     Component Value Date/Time   NA 139 04/25/2023 1454   K 4.5 04/25/2023 1454   CL 101 04/25/2023 1454   CO2 31 04/25/2023 1454   GLUCOSE 87 04/25/2023 1454   BUN 16 04/25/2023 1454   CREATININE 0.79 04/25/2023 1454   CREATININE 0.76 04/27/2020 1446   CALCIUM 9.6 04/25/2023 1454   PROT 7.8 04/25/2023  1454   ALBUMIN 4.6 04/25/2023 1454   AST 22 04/25/2023 1454   ALT 12 04/25/2023 1454   ALKPHOS 63 04/25/2023 1454   BILITOT 0.4 04/25/2023 1454   GFRNONAA 92.72 08/17/2009 1022   GFRAA 113 08/03/2007 1638      Latest Ref Rng & Units 04/25/2023    2:54 PM 01/14/2022    3:22 PM 04/27/2020    2:46 PM  Hepatic Function  Total Protein 6.0 - 8.3 g/dL 7.8  7.7  7.4   Albumin 3.5 - 5.2 g/dL 4.6  4.6    AST 0 - 37 U/L 22  18  16    ALT 0 - 35 U/L 12  11  10    Alk Phosphatase 39 - 117 U/L 63  60    Total Bilirubin 0.2 - 1.2 mg/dL 0.4  0.4  0.4        Latest Ref Rng & Units 01/08/2018   11:43 AM  Hepatitis C  HCV Quanitative Log NOT DETECT Log IU/mL <1.18 NOT DETECTED     Current Medications:   Current Outpatient Medications (Endocrine & Metabolic):    alendronate (FOSAMAX) 70 MG tablet, TAKE 1 TABLET(70 MG) BY MOUTH EVERY 7 DAYS WITH A FULL GLASS OF WATER AND ON AN EMPTY STOMACH  Current Outpatient Medications (Cardiovascular):    metoprolol tartrate (LOPRESSOR) 50 MG tablet, Take 1 tablet (50 mg total) by mouth 2 (two) times daily.  Current Outpatient Medications (Respiratory):    levocetirizine (XYZAL) 5 MG tablet, SMARTSIG:1 Tablet(s) By Mouth Every Evening   sodium chloride (OCEAN) 0.65 % SOLN nasal spray, Place 1 spray into both nostrils as needed for congestion.  Current Outpatient Medications (Analgesics):    traMADol (ULTRAM) 50 MG tablet, Take 1 tablet (50 mg total) by mouth daily as needed.   Current Outpatient Medications (Other):    Biotin w/ Vitamins C & E (HAIR/SKIN/NAILS PO), Take by mouth.   Calcium Carbonate-Vit D-Min (CALCIUM 1200 PO), Take by mouth. Take 1 tab once daily.   LORazepam (ATIVAN) 1 MG tablet, TAKE 1 TO 2 TABLETS(1 TO 2 MG) BY MOUTH AT BEDTIME   MELATONIN PO, Take 2 mg by mouth.   sertraline (ZOLOFT) 100 MG tablet, Take one and 1/2 tablets daily.   valACYclovir (VALTREX) 500 MG tablet, Prophylaxis with 1 tab PO daily  Medical History:  Past Medical History:  Diagnosis Date   Abnormal electrocardiogram    Allergic rhinitis    Allergy    seasonal allergies   Anxiety    on meds   Arthritis    R hip and left thumb   Bipolar disorder (HCC)    Cancer (HCC)    basal skin cell CA-1979   Cataracts, bilateral    COVID    Depression    some- on meds   GERD (gastroesophageal reflux disease)    not on meds at this time   History of hepatitis C    Hep B also   History of skin cancer 1980   HSV infection    Memory loss    MVP (mitral valve prolapse)    Osteoporosis    Paresthesia     Unspecified chronic bronchitis (HCC)    Allergies:  Allergies  Allergen Reactions   Cat Hair Extract    Dust Mite Extract      Surgical History:  She  has a past surgical history that includes Rhinoplasty; Tubal ligation; Appendectomy (11/08); Mohs surgery; and Colonoscopy (2014). Family History:  Her family history includes  Allergies in her father and Monique [name]; Asthma in an other family member; Atrial fibrillation in her Monique [name]; Bladder Cancer in her father; COPD in her Monique [name] and paternal uncle; Cancer - Other (age of onset: 74) in her father; Colon cancer (age of onset: 22) in her Monique [name]; Colon polyps (age of onset: 68) in her brother; Colon polyps (age of onset: 15) in her Monique [name]; Heart disease in her Monique [name]; Heart failure in her Monique [name]; Pancreatic cancer (age of onset: 65) in her paternal grandmother; Prostate cancer (age of onset: 1) in her father; Rheum arthritis in her father.  REVIEW OF SYSTEMS  : All other systems reviewed and negative except where noted in the History of Present Illness.  PHYSICAL EXAM: BP 124/70   Pulse 61   Ht 5\' 6"  (1.676 m)   Wt 124 lb (56.2 kg)   SpO2 98%   BMI 20.01 kg/m  General Appearance: Well nourished, in no apparent distress. Head:   Normocephalic and atraumatic. Eyes:  sclerae anicteric,conjunctive pink  Respiratory: Respiratory effort normal, BS equal bilaterally without rales, rhonchi, wheezing. Cardio: RRR with no MRGs. Peripheral pulses intact.  Abdomen: Soft,  Non-distended ,active bowel sounds. No tenderness but has some right rib discomfort on palpation. Without guarding and Without rebound. No masses. Rectal: Not evaluated Musculoskeletal: Full ROM, Normal gait. Without edema. Skin:  Dry and intact without significant lesions or rashes Neuro: Alert and  oriented x4;  No focal deficits. Psych:  Cooperative. Normal mood and affect.    Doree Albee, PA-C 3:03 PM

## 2023-05-13 ENCOUNTER — Other Ambulatory Visit: Payer: Medicare HMO

## 2023-05-15 ENCOUNTER — Ambulatory Visit
Admission: RE | Admit: 2023-05-15 | Discharge: 2023-05-15 | Disposition: A | Discharge status: Home / Self Care | Payer: Medicare HMO | Source: Ambulatory Visit | Attending: Internal Medicine | Admitting: Internal Medicine

## 2023-05-15 ENCOUNTER — Ambulatory Visit: Payer: Medicare HMO | Admitting: Audiologist

## 2023-05-15 DIAGNOSIS — R413 Other amnesia: Secondary | ICD-10-CM | POA: Diagnosis not present

## 2023-05-21 DIAGNOSIS — H43813 Vitreous degeneration, bilateral: Secondary | ICD-10-CM | POA: Diagnosis not present

## 2023-05-21 DIAGNOSIS — H1045 Other chronic allergic conjunctivitis: Secondary | ICD-10-CM | POA: Diagnosis not present

## 2023-05-21 DIAGNOSIS — H04123 Dry eye syndrome of bilateral lacrimal glands: Secondary | ICD-10-CM | POA: Diagnosis not present

## 2023-05-21 DIAGNOSIS — H2512 Age-related nuclear cataract, left eye: Secondary | ICD-10-CM | POA: Diagnosis not present

## 2023-05-21 DIAGNOSIS — H25811 Combined forms of age-related cataract, right eye: Secondary | ICD-10-CM | POA: Diagnosis not present

## 2023-06-12 DIAGNOSIS — H2511 Age-related nuclear cataract, right eye: Secondary | ICD-10-CM | POA: Diagnosis not present

## 2023-06-27 DIAGNOSIS — J0181 Other acute recurrent sinusitis: Secondary | ICD-10-CM | POA: Diagnosis not present

## 2023-07-22 ENCOUNTER — Ambulatory Visit: Payer: Medicare HMO | Admitting: Internal Medicine

## 2023-08-04 ENCOUNTER — Other Ambulatory Visit: Payer: Self-pay

## 2023-08-04 ENCOUNTER — Telehealth: Payer: Self-pay | Admitting: Adult Health

## 2023-08-04 DIAGNOSIS — F411 Generalized anxiety disorder: Secondary | ICD-10-CM

## 2023-08-04 MED ORDER — LORAZEPAM 1 MG PO TABS
ORAL_TABLET | ORAL | 0 refills | Status: DC
Start: 2023-08-04 — End: 2023-08-26

## 2023-08-04 NOTE — Telephone Encounter (Signed)
Patient called in for refill on Lorazepam 1mg . Ph: [phone] Appt 11/12 Pharmacy [facility] [address] [place]

## 2023-08-04 NOTE — Telephone Encounter (Signed)
Pended.

## 2023-08-11 DIAGNOSIS — J028 Acute pharyngitis due to other specified organisms: Secondary | ICD-10-CM | POA: Diagnosis not present

## 2023-08-11 DIAGNOSIS — J029 Acute pharyngitis, unspecified: Secondary | ICD-10-CM | POA: Diagnosis not present

## 2023-08-12 ENCOUNTER — Ambulatory Visit: Payer: Medicare HMO | Admitting: Adult Health

## 2023-08-13 DIAGNOSIS — Z20822 Contact with and (suspected) exposure to covid-19: Secondary | ICD-10-CM | POA: Diagnosis not present

## 2023-08-13 DIAGNOSIS — R051 Acute cough: Secondary | ICD-10-CM | POA: Diagnosis not present

## 2023-08-13 DIAGNOSIS — R52 Pain, unspecified: Secondary | ICD-10-CM | POA: Diagnosis not present

## 2023-08-13 DIAGNOSIS — J029 Acute pharyngitis, unspecified: Secondary | ICD-10-CM | POA: Diagnosis not present

## 2023-08-18 ENCOUNTER — Ambulatory Visit (INDEPENDENT_AMBULATORY_CARE_PROVIDER_SITE_OTHER): Payer: Medicare HMO | Admitting: Obstetrics and Gynecology

## 2023-08-18 ENCOUNTER — Encounter: Payer: Self-pay | Admitting: Obstetrics and Gynecology

## 2023-08-18 VITALS — BP 120/70 | HR 75 | Ht 66.25 in | Wt 123.0 lb

## 2023-08-18 DIAGNOSIS — N952 Postmenopausal atrophic vaginitis: Secondary | ICD-10-CM | POA: Diagnosis not present

## 2023-08-18 DIAGNOSIS — R5381 Other malaise: Secondary | ICD-10-CM | POA: Diagnosis not present

## 2023-08-18 DIAGNOSIS — M8000XA Age-related osteoporosis with current pathological fracture, unspecified site, initial encounter for fracture: Secondary | ICD-10-CM

## 2023-08-18 DIAGNOSIS — R0989 Other specified symptoms and signs involving the circulatory and respiratory systems: Secondary | ICD-10-CM | POA: Diagnosis not present

## 2023-08-18 DIAGNOSIS — Z8619 Personal history of other infectious and parasitic diseases: Secondary | ICD-10-CM

## 2023-08-18 DIAGNOSIS — Z9289 Personal history of other medical treatment: Secondary | ICD-10-CM

## 2023-08-18 DIAGNOSIS — B009 Herpesviral infection, unspecified: Secondary | ICD-10-CM

## 2023-08-18 DIAGNOSIS — Z9189 Other specified personal risk factors, not elsewhere classified: Secondary | ICD-10-CM | POA: Diagnosis not present

## 2023-08-18 DIAGNOSIS — M810 Age-related osteoporosis without current pathological fracture: Secondary | ICD-10-CM

## 2023-08-18 DIAGNOSIS — R051 Acute cough: Secondary | ICD-10-CM | POA: Diagnosis not present

## 2023-08-18 MED ORDER — ROMOSOZUMAB-AQQG 105 MG/1.17ML ~~LOC~~ SOSY: 210.0000 mg | PREFILLED_SYRINGE | Freq: Once | SUBCUTANEOUS | Status: AC

## 2023-08-18 NOTE — Progress Notes (Unsigned)
67 y.o. y.o. female here for annual exam. She denies any *** bleeding.   No LMP recorded. Patient is postmenopausal.   RP:  Established patient presenting for annual gyn exam    HPI: Postmenopause, well on no HRT.  No PMB.  Occasional lower abdominal discomfort.  Tendency for constipation.  Urine normal. Abstinent.  Pap Neg in 08/2018.  Breast normal.  Mammo 04/2022 Neg. Osteoporosis on Fosamax (having Gerd and side effects)-PA placed for Evenity and prolia, last BD T-Score -2.4 04/2021.  Rare genital HSV recurrences on Valacyclovir prophylaxis, cannot have an outbreak without it. Body mass index 20.02.  In good fitness.  Health labs with family physician.  Colono 06/2020.  Mother with Colon Ca.  Blood pressure 120/70, pulse 75, height 5' 6.25" (1.683 m), weight 123 lb (55.8 kg), SpO2 98%.     Component Value Date/Time   DIAGPAP  08/15/2022 1541    - Negative for intraepithelial lesion or malignancy (NILM)   ADEQPAP  08/15/2022 1541    Satisfactory for evaluation. The presence or absence of an   ADEQPAP  08/15/2022 1541    endocervical/transformation zone component cannot be determined because   ADEQPAP of atrophy. 08/15/2022 1541    GYN HISTORY:    Component Value Date/Time   DIAGPAP  08/15/2022 1541    - Negative for intraepithelial lesion or malignancy (NILM)   ADEQPAP  08/15/2022 1541    Satisfactory for evaluation. The presence or absence of an   ADEQPAP  08/15/2022 1541    endocervical/transformation zone component cannot be determined because   ADEQPAP of atrophy. 08/15/2022 1541    OB History  Gravida Para Term Preterm AB Living  0 0 0 0 0 0  SAB IAB Ectopic Multiple Live Births  0 0 0 0 0    Past Medical History:  Diagnosis Date   Abnormal electrocardiogram    Allergic rhinitis    Allergy    seasonal allergies   Anxiety    on meds   Arthritis    R hip and left thumb   Bipolar disorder (HCC)    Cancer (HCC)    basal skin cell CA-1979   Cataracts,  bilateral    COVID    Depression    some- on meds   GERD (gastroesophageal reflux disease)    not on meds at this time   History of hepatitis C    Hep B also   History of skin cancer 1980   HSV infection    Memory loss    MVP (mitral valve prolapse)    Osteoporosis    Paresthesia    Unspecified chronic bronchitis (HCC)     Past Surgical History:  Procedure Laterality Date   APPENDECTOMY  11/08   COLONOSCOPY  2014   MOHS SURGERY     RHINOPLASTY     TUBAL LIGATION     bilateral    Current Outpatient Medications on File Prior to Visit  Medication Sig Dispense Refill   alendronate (FOSAMAX) 70 MG tablet TAKE 1 TABLET(70 MG) BY MOUTH EVERY 7 DAYS WITH A FULL GLASS OF WATER AND ON AN EMPTY STOMACH 12 tablet 4   Azelastine HCl 137 MCG/SPRAY SOLN USE 1 TO 2 SPRAYS IN EACH NOSTRIL TWICE DAILY AS NEEDED     Biotin w/ Vitamins C & E (HAIR/SKIN/NAILS PO) Take by mouth.     brompheniramine-pseudoephedrine-DM 30-2-10 MG/5ML syrup Take by mouth.     Calcium Carbonate-Vit D-Min (CALCIUM 1200 PO) Take by  mouth. Take 1 tab once daily.     Cholecalciferol 50 MCG (2000 UT) TABS Take 1 tablet by mouth daily.     levocetirizine (XYZAL) 5 MG tablet SMARTSIG:1 Tablet(s) By Mouth Every Evening     LORazepam (ATIVAN) 1 MG tablet TAKE 1 TO 2 TABLETS(1 TO 2 MG) BY MOUTH AT BEDTIME 60 tablet 0   MELATONIN PO Take 2 mg by mouth.     sertraline (ZOLOFT) 100 MG tablet Take one and 1/2 tablets daily. 135 tablet 3   sodium chloride (OCEAN) 0.65 % SOLN nasal spray Place 1 spray into both nostrils as needed for congestion.     traMADol (ULTRAM) 50 MG tablet Take 1 tablet (50 mg total) by mouth daily as needed. 15 tablet 1   valACYclovir (VALTREX) 500 MG tablet Prophylaxis with 1 tab PO daily 90 tablet 4   metoprolol tartrate (LOPRESSOR) 50 MG tablet Take 1 tablet (50 mg total) by mouth 2 (two) times daily. (Patient not taking: Reported on 08/18/2023) 180 tablet 3   No current facility-administered  medications on file prior to visit.    Social History   Socioeconomic History   Marital status: Married    Spouse name: Not on file   Number of children: 0   Years of education: Not on file   Highest education level: Not on file  Occupational History   Occupation: SITTER    Employer: SELF EMPLOYED    Comment: parent caregiver  Tobacco Use   Smoking status: Former    Current packs/day: 0.00    Average packs/day: 1.5 packs/day for 25.0 years (37.5 ttl pk-yrs)    Types: Cigarettes    Start date: 10/01/1971    Quit date: 09/30/1996    Years since quitting: 26.8   Smokeless tobacco: Never  Vaping Use   Vaping status: Never Used  Substance and Sexual Activity   Alcohol use: Not Currently   Drug use: No   Sexual activity: Not Currently    Partners: Male    Birth control/protection: Post-menopausal, Surgical    Comment: 1st intercourse- 16, partners- more than 5, btl  Other Topics Concern   Not on file  Social History Narrative   1 cup caffeine daily   Married   No children   Daughter of marjorie herndon   Social Determinants of Health   Financial Resource Strain: Not on file  Food Insecurity: Not on file  Transportation Needs: Not on file  Physical Activity: Not on file  Stress: Not on file  Social Connections: Not on file  Intimate Partner Violence: Not on file    Family History  Problem Relation Age of Onset   COPD Mother    Allergies Mother    Heart disease Mother    Atrial fibrillation Mother    Heart failure Mother    Colon cancer Mother 73       Had 6 inches, resected.   Colon polyps Mother 61   Allergies Father    Rheum arthritis Father    Bladder Cancer Father    Prostate cancer Father 63   Cancer - Other Father 30       Bladder   COPD Paternal Uncle    Pancreatic cancer Paternal Grandmother 70   Colon polyps Brother 77   Asthma Other    Esophageal cancer Neg Hx    Stomach cancer Neg Hx    Rectal cancer Neg Hx    Breast cancer Neg Hx       Allergies  Allergen Reactions   Cat Hair Extract    Dust Mite Extract       Patient's last menstrual period was No LMP recorded. Patient is postmenopausal..          Sexually active: ***  Exercising: ***   Review of Systems Alls systems reviewed and are negative.     OBGyn Exam    A:         Well Woman GYN exam                             P:        Pap smear {Pap indication:31164} Encouraged annual mammogram screening Colon cancer screening {Colon cancer screening:31170} DXA {DXA screening:31171} Labs and immunizations {annual labs:31172} Discussed breast self exams Encouraged healthy lifestyle practices Encouraged Vit D and Calcium   No follow-ups on file.  Earley Favor

## 2023-08-19 MED ORDER — VALACYCLOVIR HCL 500 MG PO TABS: ORAL_TABLET | ORAL | 4 refills | Status: DC

## 2023-08-19 MED ORDER — VALACYCLOVIR HCL 500 MG PO TABS: 500.0000 mg | ORAL_TABLET | Freq: Two times a day (BID) | ORAL | 12 refills | Status: DC

## 2023-08-26 ENCOUNTER — Encounter: Payer: Self-pay | Admitting: Adult Health

## 2023-08-26 ENCOUNTER — Ambulatory Visit: Payer: Medicare HMO | Admitting: Adult Health

## 2023-08-26 DIAGNOSIS — F411 Generalized anxiety disorder: Secondary | ICD-10-CM

## 2023-08-26 DIAGNOSIS — G4700 Insomnia, unspecified: Secondary | ICD-10-CM

## 2023-08-26 DIAGNOSIS — F3181 Bipolar II disorder: Secondary | ICD-10-CM

## 2023-08-26 MED ORDER — LORAZEPAM 1 MG PO TABS: ORAL_TABLET | ORAL | 2 refills | Status: DC

## 2023-08-26 MED ORDER — SERTRALINE HCL 100 MG PO TABS: ORAL_TABLET | ORAL | 3 refills | Status: DC

## 2023-08-26 NOTE — Progress Notes (Signed)
Monique [name] [id number] [birth year] 67 y.o.  Subjective:   Patient ID:  Monique [name] is a 67 y.o. (DOB [birth year]) female.  Chief Complaint: No chief complaint on file.   HPI Monique [name] presents to the office today for follow-up of GAD, insomnia and BPD2.  Describes mood today as "ok" Pleasant. Tearful at times. Mood symptoms - reports "some" depression, anxiety, and irritability. Reports a recent panic attack. Reports some worry, rumination, and over thinking. Mood is stable. Stating "I feel like I'm doing alright". Feels like her current medication regimen works well. She and husband doing well. Stable interest and motivation. Taking medications as prescribed. Energy levels "not good". Active, does not have a regular exercise routine - has started walking. Enjoys some usual interests and activities. Married. Lives with husband. Family local. Spending time with family. Appetite adequate. Weight stable - 129 pounds - 67". Sleeps well most nights. Averages 8 to 9 hours. Focus and concentration stable. Completing tasks. Managing aspects of household. Working part-time.  Denies SI or HI.  Denies AH or VH. Denies self harm. Denies substance use.  Previous medication trials:  Denies   GAD-7    Flowsheet Row Office Visit from 04/06/2019 in [facility]  Total GAD-7 Score 8      PHQ2-9    Flowsheet Row Office Visit from 04/25/2023 in [facility] Office Visit from 04/27/2020 in [facility] Office Visit from 04/06/2019 in [facility] Office Visit from 10/09/2017 in [facility] Office Visit from 09/25/2016 in [facility] at [facility] Total Score 2 2 2  0 0  PHQ-9 Total Score 14 7 9  -- --        Review of Systems:  Review of Systems  Musculoskeletal:  Negative for gait problem.  Neurological:  Negative for tremors.   Psychiatric/Behavioral:         Please refer to HPI    Medications: I have reviewed the patient's current medications.  Current Outpatient Medications  Medication Sig Dispense Refill   alendronate (FOSAMAX) 70 MG tablet TAKE 1 TABLET(70 MG) BY MOUTH EVERY 7 DAYS WITH A FULL GLASS OF WATER AND ON AN EMPTY STOMACH 12 tablet 4   Azelastine HCl 137 MCG/SPRAY SOLN USE 1 TO 2 SPRAYS IN EACH NOSTRIL TWICE DAILY AS NEEDED     Biotin w/ Vitamins C & E (HAIR/SKIN/NAILS PO) Take by mouth.     Calcium Carbonate-Vit D-Min (CALCIUM 1200 PO) Take by mouth. Take 1 tab once daily.     Cholecalciferol 50 MCG (2000 UT) TABS Take 1 tablet by mouth daily.     levocetirizine (XYZAL) 5 MG tablet SMARTSIG:1 Tablet(s) By Mouth Every Evening     LORazepam (ATIVAN) 1 MG tablet TAKE 1 TO 2 TABLETS(1 TO 2 MG) BY MOUTH AT BEDTIME 60 tablet 0   MELATONIN PO Take 2 mg by mouth.     metoprolol tartrate (LOPRESSOR) 50 MG tablet Take 1 tablet (50 mg total) by mouth 2 (two) times daily. (Patient not taking: Reported on 08/18/2023) 180 tablet 3   sertraline (ZOLOFT) 100 MG tablet Take one and 1/2 tablets daily. 135 tablet 3   sodium chloride (OCEAN) 0.65 % SOLN nasal spray Place 1 spray into both nostrils as needed for congestion.     traMADol (ULTRAM) 50 MG tablet Take 1 tablet (50 mg total) by mouth daily as needed. 15 tablet 1  valACYclovir (VALTREX) 500 MG tablet Take 1 tablet (500 mg total) by mouth 2 (two) times daily. 30 tablet 12   valACYclovir (VALTREX) 500 MG tablet Prophylaxis with 1 tab PO daily 90 tablet 4   Current Facility-Administered Medications  Medication Dose Route Frequency Provider Last Rate Last Admin   Romosozumab-aqqg (EVENITY) 105 MG/1. injection 210 mg  210 mg Subcutaneous Once         Medication Side Effects: None  Allergies:  Allergies  Allergen Reactions   Cat Hair Extract    Dust Mite Extract     Past Medical History:  Diagnosis Date   Abnormal electrocardiogram    Allergic  rhinitis    Allergy    seasonal allergies   Anxiety    on meds   Arthritis    R hip and left thumb   Bipolar disorder (HCC)    Cancer (HCC)    basal skin cell CA-1979   Cataracts, bilateral    COVID    Depression    some- on meds   GERD (gastroesophageal reflux disease)    not on meds at this time   History of hepatitis C    Hep B also   History of skin cancer 1980   HSV infection    Memory loss    MVP (mitral valve prolapse)    Osteoporosis    Paresthesia    Unspecified chronic bronchitis (HCC)     Past Medical History, Surgical history, Social history, and Family history were reviewed and updated as appropriate.   Please see review of systems for further details on the patient's review from today.   Objective:   Physical Exam:  There were no vitals taken for this visit.  Physical Exam Constitutional:      General: She is not in acute distress. Chest:     Chest wall: No tenderness.  Musculoskeletal:        General: No deformity.  Neurological:     Mental Status: She is alert and oriented to person, place, and time.     Coordination: Coordination normal.  Psychiatric:        Attention and Perception: Attention and perception normal. She does not perceive auditory or visual hallucinations.        Mood and Affect: Affect is not labile, blunt, angry or inappropriate.        Speech: Speech normal.        Behavior: Behavior normal.        Thought Content: Thought content normal. Thought content is not paranoid or delusional. Thought content does not include homicidal or suicidal ideation. Thought content does not include homicidal or suicidal plan.        Cognition and Memory: Cognition and memory normal.        Judgment: Judgment normal.     Comments: Insight intact     Lab Review:     Component Value Date/Time   NA 139 04/25/2023 1454   K 4.5 04/25/2023 1454   CL 101 04/25/2023 1454   CO2 31 04/25/2023 1454   GLUCOSE 87 04/25/2023 1454   BUN 16  04/25/2023 1454   CREATININE 0.79 04/25/2023 1454   CREATININE 0.76 04/27/2020 1446   CALCIUM 9.6 04/25/2023 1454   PROT 7.8 04/25/2023 1454   ALBUMIN 4.6 04/25/2023 1454   AST 22 04/25/2023 1454   ALT 12 04/25/2023 1454   ALKPHOS 63 04/25/2023 1454   BILITOT 0.4 04/25/2023 1454   GFRNONAA 92.72 08/17/2009 1022   GFRAA  113 08/03/2007 1638       Component Value Date/Time   WBC 5.6 04/25/2023 1454   RBC 4.42 04/25/2023 1454   HGB 13.4 04/25/2023 1454   HCT 41.8 04/25/2023 1454   PLT 236.0 04/25/2023 1454   MCV 94.7 04/25/2023 1454   MCH 31.6 04/27/2020 1446   MCHC 32.1 04/25/2023 1454   RDW 15.2 04/25/2023 1454   LYMPHSABS 1.5 07/29/2014 1117   MONOABS 0.5 07/29/2014 1117   EOSABS 0.1 07/29/2014 1117   BASOSABS 0.0 07/29/2014 1117    No results found for: "POCLITH", "LITHIUM"   No results found for: "PHENYTOIN", "PHENOBARB", "VALPROATE", "CBMZ"   .res Assessment: Plan:     Plan:  PDMP reviewed  Ativan 1mg  - take one and 1/2 tabs daily as needed. Zoloft 150mg  daily  Time spent with patient was 25 minutes. Greater than 50% of face to face time with patient was spent on counseling and coordination of care.    Discussed potential benefits, risk, and side effects of benzodiazepines to include potential risk of tolerance and dependence, as well as possible drowsiness. Advised patient not to drive if experiencing drowsiness and to take lowest possible effective dose to minimize risk of dependence and tolerance.   RTC 6 months   There are no diagnoses linked to this encounter.   Please see After Visit Summary for patient specific instructions.  Future Appointments  Date Time Provider Department Center  09/02/2023  3:30 PM [facility]  09/10/2023  3:00 PM Tobb, Lavona Mound, DO [facility] None    No orders of the defined types were placed in this encounter.   -------------------------------

## 2023-09-02 ENCOUNTER — Ambulatory Visit (HOSPITAL_BASED_OUTPATIENT_CLINIC_OR_DEPARTMENT_OTHER)
Admission: RE | Admit: 2023-09-02 | Discharge: 2023-09-02 | Disposition: A | Discharge status: Home / Self Care | Payer: Medicare HMO | Source: Ambulatory Visit | Attending: Obstetrics and Gynecology | Admitting: Obstetrics and Gynecology

## 2023-09-02 ENCOUNTER — Other Ambulatory Visit: Payer: Self-pay | Admitting: Radiology

## 2023-09-02 DIAGNOSIS — M8000XA Age-related osteoporosis with current pathological fracture, unspecified site, initial encounter for fracture: Secondary | ICD-10-CM | POA: Diagnosis present

## 2023-09-02 DIAGNOSIS — Z13820 Encounter for screening for osteoporosis: Secondary | ICD-10-CM | POA: Insufficient documentation

## 2023-09-02 DIAGNOSIS — Z780 Asymptomatic menopausal state: Secondary | ICD-10-CM | POA: Insufficient documentation

## 2023-09-02 DIAGNOSIS — M8589 Other specified disorders of bone density and structure, multiple sites: Secondary | ICD-10-CM | POA: Diagnosis not present

## 2023-09-02 NOTE — Telephone Encounter (Signed)
Med refill request: valacyclovir 1000 mg Last AEX: 08/18/23  Next AEX: none Last MMG (if hormonal med) n/a RX for Valacyclovir 500 mg was sent 08/19/23 for #90 with 4 refills.  RF sent to provider for

## 2023-09-03 ENCOUNTER — Telehealth: Payer: Self-pay | Admitting: *Deleted

## 2023-09-03 NOTE — Telephone Encounter (Signed)
 Insurance information submitted to [employer] portal. Will await summary of benefits for Evenity.

## 2023-09-03 NOTE — Telephone Encounter (Signed)
-----   Message from Earley Favor sent at 08/18/2023  3:34 PM EST ----- Please run PA for evenity and prolia. Osteoporosis with side effects with fosamax. Thank you Dr. Astrid Drafts

## 2023-09-10 ENCOUNTER — Encounter: Payer: Self-pay | Admitting: Cardiology

## 2023-09-10 ENCOUNTER — Encounter: Payer: Self-pay | Admitting: Obstetrics and Gynecology

## 2023-09-10 ENCOUNTER — Ambulatory Visit: Payer: Medicare HMO | Attending: Internal Medicine | Admitting: Cardiology

## 2023-09-10 ENCOUNTER — Ambulatory Visit (INDEPENDENT_AMBULATORY_CARE_PROVIDER_SITE_OTHER): Payer: Medicare HMO

## 2023-09-10 VITALS — BP 128/76 | HR 78 | Ht 66.0 in | Wt 130.8 lb

## 2023-09-10 DIAGNOSIS — I341 Nonrheumatic mitral (valve) prolapse: Secondary | ICD-10-CM

## 2023-09-10 DIAGNOSIS — R002 Palpitations: Secondary | ICD-10-CM | POA: Diagnosis not present

## 2023-09-10 DIAGNOSIS — R9431 Abnormal electrocardiogram [ECG] [EKG]: Secondary | ICD-10-CM | POA: Diagnosis not present

## 2023-09-10 DIAGNOSIS — I059 Rheumatic mitral valve disease, unspecified: Secondary | ICD-10-CM

## 2023-09-10 DIAGNOSIS — M810 Age-related osteoporosis without current pathological fracture: Secondary | ICD-10-CM

## 2023-09-10 MED ORDER — ALENDRONATE SODIUM 70 MG PO TABS: ORAL_TABLET | ORAL | 0 refills | Status: DC

## 2023-09-10 NOTE — Progress Notes (Signed)
Cardiology Office Note:    Date:  09/10/2023   ID:  Monique [name], Monique [name] 10/30/1955, MRN [id number]  PCP:  Myrlene Broker, MD  Cardiologist:  Thomasene Ripple, DO  Electrophysiologist:  None   Referring MD: Myrlene Broker, *   No chief complaint on file.   History of Present Illness:    Monique [name] is a 67 y.o. female with a hx of fibromyalgia, anxiety, depression, bipolar disorder, GERD.  She was referred  due to an abnormal EKG. She has a history of similar EKG abnormalities in the past. She reports occasional shortness of breath, particularly when lying down, but denies any chest discomfort. She has a family history of CHF in her mother and acute afib in her father.  She believes her asthma and other factors may have taxed her lungs and heart, leading to an irregular heartbeat. She also has a history of mitral valve prolapse and has been told she has a heart murmur. She experiences palpitations, which she believes are linked to her episodes of shortness of breath. She denies any associated lightheadedness or dizziness.  The patient is scheduled for cataract surgery in her left eye. She also takes lisinopril 10mg  daily.  Past Medical History:  Diagnosis Date   Abnormal electrocardiogram    Allergic rhinitis    Allergy    seasonal allergies   Anxiety    on meds   Arthritis    R hip and left thumb   Bipolar disorder (HCC)    Cancer (HCC)    basal skin cell CA-1979   Cataracts, bilateral    COVID    Depression    some- on meds   GERD (gastroesophageal reflux disease)    not on meds at this time   History of hepatitis C    Hep B also   History of skin cancer 1980   HSV infection    Memory loss    MVP (mitral valve prolapse)    Osteoporosis    Paresthesia    Unspecified chronic bronchitis (HCC)     Past Surgical History:  Procedure Laterality Date   APPENDECTOMY  11/08   COLONOSCOPY  2014   MOHS SURGERY     RHINOPLASTY     TUBAL LIGATION      bilateral    Current Medications: Current Meds  Medication Sig   Azelastine HCl 137 MCG/SPRAY SOLN USE 1 TO 2 SPRAYS IN EACH NOSTRIL TWICE DAILY AS NEEDED   Biotin w/ Vitamins C & E (HAIR/SKIN/NAILS PO) Take by mouth.   Calcium Carbonate-Vit D-Min (CALCIUM 1200 PO) Take by mouth. Take 1 tab once daily.   Cholecalciferol 50 MCG (2000 UT) TABS Take 1 tablet by mouth daily.   levocetirizine (XYZAL) 5 MG tablet SMARTSIG:1 Tablet(s) By Mouth Every Evening   LORazepam (ATIVAN) 1 MG tablet TAKE 1 TO 2 TABLETS(1 TO 2 MG) BY MOUTH AT BEDTIME   MELATONIN PO Take 2 mg by mouth.   metoprolol tartrate (LOPRESSOR) 50 MG tablet Take 1 tablet (50 mg total) by mouth 2 (two) times daily.   sertraline (ZOLOFT) 100 MG tablet Take one and 1/2 tablets daily.   sodium chloride (OCEAN) 0.65 % SOLN nasal spray Place 1 spray into both nostrils as needed for congestion.   traMADol (ULTRAM) 50 MG tablet Take 1 tablet (50 mg total) by mouth daily as needed.   valACYclovir (VALTREX) 500 MG tablet Take 1 tablet (500 mg total) by mouth 2 (two) times daily.  valACYclovir (VALTREX) 500 MG tablet Prophylaxis with 1 tab PO daily   [DISCONTINUED] alendronate (FOSAMAX) 70 MG tablet TAKE 1 TABLET(70 MG) BY MOUTH EVERY 7 DAYS WITH A FULL GLASS OF WATER AND ON AN EMPTY STOMACH   Current Facility-Administered Medications for the 09/10/23 encounter (Office Visit) with Thomasene Ripple, DO  Medication   Romosozumab-aqqg (EVENITY) 105 MG/1. injection 210 mg     Allergies:   Cat hair extract and Dust mite extract   Social History   Socioeconomic History   Marital status: Married    Spouse name: Not on file   Number of children: 0   Years of education: Not on file   Highest education level: Not on file  Occupational History   Occupation: SITTER    Employer: SELF EMPLOYED    Comment: parent caregiver  Tobacco Use   Smoking status: Former    Current packs/day: 0.00    Average packs/day: 1.5 packs/day for 25.0 years (37.5  ttl pk-yrs)    Types: Cigarettes    Start date: 10/01/1971    Quit date: 09/30/1996    Years since quitting: 26.9   Smokeless tobacco: Never  Vaping Use   Vaping status: Never Used  Substance and Sexual Activity   Alcohol use: Not Currently   Drug use: No   Sexual activity: Not Currently    Partners: Male    Birth control/protection: Post-menopausal, Surgical    Comment: 1st intercourse- 16, partners- more than 5, btl  Other Topics Concern   Not on file  Social History Narrative   1 cup caffeine daily   Married   No children   Daughter of marjorie herndon   Social Determinants of Health   Financial Resource Strain: Not on file  Food Insecurity: Not on file  Transportation Needs: Not on file  Physical Activity: Not on file  Stress: Not on file  Social Connections: Not on file     Family History: The patient's family history includes Allergies in her father and mother; Asthma in an other family member; Atrial fibrillation in her mother; Bladder Cancer in her father; COPD in her mother and paternal uncle; Cancer - Other (age of onset: 37) in her father; Colon cancer (age of onset: 64) in her mother; Colon polyps (age of onset: 2) in her brother; Colon polyps (age of onset: 44) in her mother; Heart disease in her mother; Heart failure in her mother; Pancreatic cancer (age of onset: 57) in her paternal grandmother; Prostate cancer (age of onset: 4) in her father; Rheum arthritis in her father. There is no history of Esophageal cancer, Stomach cancer, Rectal cancer, or Breast cancer.  ROS:   Review of Systems  Constitution: Negative for decreased appetite, fever and weight gain.  HENT: Negative for congestion, ear discharge, hoarse voice and sore throat.   Eyes: Negative for discharge, redness, vision loss in right eye and visual halos.  Cardiovascular: Negative for chest pain, dyspnea on exertion, leg swelling, orthopnea and palpitations.  Respiratory: Negative for cough,  hemoptysis, shortness of breath and snoring.   Endocrine: Negative for heat intolerance and polyphagia.  Hematologic/Lymphatic: Negative for bleeding problem. Does not bruise/bleed easily.  Skin: Negative for flushing, nail changes, rash and suspicious lesions.  Musculoskeletal: Negative for arthritis, joint pain, muscle cramps, myalgias, neck pain and stiffness.  Gastrointestinal: Negative for abdominal pain, bowel incontinence, diarrhea and excessive appetite.  Genitourinary: Negative for decreased libido, genital sores and incomplete emptying.  Neurological: Negative for brief paralysis, focal weakness, headaches and  loss of balance.  Psychiatric/Behavioral: Negative for altered mental status, depression and suicidal ideas.  Allergic/Immunologic: Negative for HIV exposure and persistent infections.    EKGs/Labs/Other Studies Reviewed:    The following studies were reviewed today:   EKG:  The ekg ordered today demonstrates sinus rhythm, left atrial enlargement , HR 73 bpm   Recent Labs: 04/25/2023: ALT 12; BUN 16; Creatinine, Ser 0.79; Hemoglobin 13.4; Platelets 236.0; Potassium 4.5; Sodium 139  Recent Lipid Panel    Component Value Date/Time   CHOL 193 04/25/2023 1454   TRIG 84.0 04/25/2023 1454   HDL 80.40 04/25/2023 1454   CHOLHDL 2 04/25/2023 1454   VLDL 16.8 04/25/2023 1454   LDLCALC 96 04/25/2023 1454   LDLCALC 71 04/27/2020 1446    Physical Exam:    VS:  BP 128/76 (BP Location: Left Arm, Patient Position: Sitting, Cuff Size: Normal)   Pulse 78   Ht 5\' 6"  (1.676 m)   Wt 130 lb 12.8 oz (59.3 kg)   SpO2 95%   BMI 21.11 kg/m     Wt Readings from Last 3 Encounters:  09/10/23 130 lb 12.8 oz (59.3 kg)  08/18/23 123 lb (55.8 kg)  05/12/23 124 lb (56.2 kg)     GEN: Well nourished, well developed in no acute distress HEENT: Normal NECK: No JVD; No carotid bruits LYMPHATICS: No lymphadenopathy CARDIAC: S1S2 noted,RRR, no murmurs, rubs, gallops RESPIRATORY:  Clear to  auscultation without rales, wheezing or rhonchi  ABDOMEN: Soft, non-tender, non-distended, +bowel sounds, no guarding. EXTREMITIES: No edema, No cyanosis, no clubbing MUSCULOSKELETAL:  No deformity  SKIN: Warm and dry NEUROLOGIC:  Alert and oriented x 3, non-focal PSYCHIATRIC:  Normal affect, good insight  ASSESSMENT:    1. Mitral valve prolapse   2. Palpitations   3. Mitral valve disorder   4. Abnormal EKG    PLAN:     Her EKG is concerning for left atrial enlargement, at this time we will like to get an echocardiogram to assess for any structural abnormalities. In terms of her palpitations we will place a monitor on the patient to rule out any arrhythmias.  The patient is in agreement with the above plan. The patient left the office in stable condition.  The patient will follow up in 16 weeks   Medication Adjustments/Labs and Tests Ordered: Current medicines are reviewed at length with the patient today.  Concerns regarding medicines are outlined above.  Orders Placed This Encounter  Procedures   LONG TERM MONITOR (3-14 DAYS)   ECHOCARDIOGRAM COMPLETE   No orders of the defined types were placed in this encounter.   Patient Instructions  Medication Instructions:  Your physician recommends that you continue on your current medications as directed. Please refer to the Current Medication list given to you today.  *If you need a refill on your cardiac medications before your next appointment, please call your pharmacy*   Testing/Procedures: Your physician has requested that you have an echocardiogram. Echocardiography is a painless test that uses sound waves to create images of your heart. It provides your doctor with information about the size and shape of your heart and how well your heart's chambers and valves are working. This procedure takes approximately one hour. There are no restrictions for this procedure. Please do NOT wear cologne, perfume, aftershave, or lotions  (deodorant is allowed). Please arrive 15 minutes prior to your appointment time.  Please note: We ask at that you not bring children with you during ultrasound (echo/ vascular) testing. Due  to room size and safety concerns, children are not allowed in the ultrasound rooms during exams. Our front office staff cannot provide observation of children in our lobby area while testing is being conducted. An adult accompanying a patient to their appointment will only be allowed in the ultrasound room at the discretion of the ultrasound technician under special circumstances. We apologize for any inconvenience.  ZIO XT- Long Term Monitor Instructions  Your physician has requested you wear a ZIO patch monitor for 14 days.  This is a single patch monitor. [employer] supplies one patch monitor per enrollment. Additional stickers are not available. Please do not apply patch if you will be having a Nuclear Stress Test,  Echocardiogram, Cardiac CT, MRI, or Chest Xray during the period you would be wearing the  monitor. The patch cannot be worn during these tests. You cannot remove and re-apply the  ZIO XT patch monitor.  Your ZIO patch monitor will be mailed 3 day USPS to your address on file. It may take 3-5 days  to receive your monitor after you have been enrolled.  Once you have received your monitor, please review the enclosed instructions. Your monitor  has already been registered assigning a specific monitor serial # to you.  Billing and Patient Assistance Program Information  We have supplied [employer] with any of your insurance information on file for billing purposes. [employer] offers a sliding scale Patient Assistance Program for patients that do not have  insurance, or whose insurance does not completely cover the cost of the ZIO monitor.  You must apply for the Patient Assistance Program to qualify for this discounted rate.  To apply, please call [employer] at [phone], select option 4, select option  2, ask to apply for  Patient Assistance Program. Meredeth Ide will ask your household income, and how many people  are in your household. They will quote your out-of-pocket cost based on that information.  [employer] will also be able to set up a 83-month, interest-free payment plan if needed.  Applying the monitor   Shave hair from upper left chest.  Hold abrader disc by orange tab. Rub abrader in 40 strokes over the upper left chest as  indicated in your monitor instructions.  Clean area with 4 enclosed alcohol pads. Let dry.  Apply patch as indicated in monitor instructions. Patch will be placed under collarbone on left  side of chest with arrow pointing upward.  Rub patch adhesive wings for 2 minutes. Remove white label marked "1". Remove the white  label marked "2". Rub patch adhesive wings for 2 additional minutes.  While looking in a mirror, press and release button in center of patch. A small green light will  flash 3-4 times. This will be your only indicator that the monitor has been turned on.  Do not shower for the first 24 hours. You may shower after the first 24 hours.  Press the button if you feel a symptom. You will hear a small click. Record Date, Time and  Symptom in the Patient Logbook.  When you are ready to remove the patch, follow instructions on the last 2 pages of Patient  Logbook. Stick patch monitor onto the last page of Patient Logbook.  Place Patient Logbook in the blue and white box. Use locking tab on box and tape box closed  securely. The blue and white box has prepaid postage on it. Please place it in the mailbox as  soon as possible. Your physician should have your test results  approximately 7 days after the  monitor has been mailed back to [place].  Call [facility] Customer Care at [phone] if you have questions regarding  your ZIO XT patch monitor. Call them immediately if you see an orange light blinking on your  monitor.  If your monitor falls  off in less than 4 days, contact our Monitor department at [phone].  If your monitor becomes loose or falls off after 4 days call [employer] at [phone] for  suggestions on securing your monitor    Follow-Up: At [facility], you and your health needs are our priority.  As part of our continuing mission to provide you with exceptional heart care, we have created designated Provider Care Teams.  These Care Teams include your primary Cardiologist (physician) and Advanced Practice Providers (APPs -  Physician Assistants and Nurse Practitioners) who all work together to provide you with the care you need, when you need it.  Your next appointment:   16 week(s)  Provider:   Thomasene Ripple, DO     Adopting a Healthy Lifestyle.  Know what a healthy weight is for you (roughly BMI <25) and aim to maintain this   Aim for 7+ servings of fruits and vegetables daily   65-80+ fluid ounces of water or unsweet tea for healthy kidneys   Limit to max 1 drink of alcohol per day; avoid smoking/tobacco   Limit animal fats in diet for cholesterol and heart health - choose grass fed whenever available   Avoid highly processed foods, and foods high in saturated/trans fats   Aim for low stress - take time to unwind and care for your mental health   Aim for 150 min of moderate intensity exercise weekly for heart health, and weights twice weekly for bone health   Aim for 7-9 hours of sleep daily   When it comes to diets, agreement about the perfect plan isnt easy to find, even among the experts. Experts at the [facility] of [employer] developed an idea known as the Healthy Eating Plate. Just imagine a plate divided into logical, healthy portions.   The emphasis is on diet quality:   Load up on vegetables and fruits - one-half of your plate: Aim for color and variety, and remember that potatoes dont count.   Go for whole grains - one-quarter of your plate: Whole wheat, barley,  wheat berries, quinoa, oats, brown rice, and foods made with them. If you want pasta, go with whole wheat pasta.   Protein power - one-quarter of your plate: Fish, chicken, beans, and nuts are all healthy, versatile protein sources. Limit red meat.   The diet, however, does go beyond the plate, offering a few other suggestions.   Use healthy plant oils, such as olive, canola, soy, corn, sunflower and peanut. Check the labels, and avoid partially hydrogenated oil, which have unhealthy trans fats.   If youre thirsty, drink water. Coffee and tea are good in moderation, but skip sugary drinks and limit milk and dairy products to one or two daily servings.   The type of carbohydrate in the diet is more important than the amount. Some sources of carbohydrates, such as vegetables, fruits, whole grains, and beans-are healthier than others.   Finally, stay active  Signed, Thomasene Ripple, DO  09/10/2023 3:41 PM    [facility]

## 2023-09-10 NOTE — Patient Instructions (Signed)
Medication Instructions:  Your physician recommends that you continue on your current medications as directed. Please refer to the Current Medication list given to you today.  *If you need a refill on your cardiac medications before your next appointment, please call your pharmacy*   Testing/Procedures: Your physician has requested that you have an echocardiogram. Echocardiography is a painless test that uses sound waves to create images of your heart. It provides your doctor with information about the size and shape of your heart and how well your heart's chambers and valves are working. This procedure takes approximately one hour. There are no restrictions for this procedure. Please do NOT wear cologne, perfume, aftershave, or lotions (deodorant is allowed). Please arrive 15 minutes prior to your appointment time.  Please note: We ask at that you not bring children with you during ultrasound (echo/ vascular) testing. Due to room size and safety concerns, children are not allowed in the ultrasound rooms during exams. Our front office staff cannot provide observation of children in our lobby area while testing is being conducted. An adult accompanying a patient to their appointment will only be allowed in the ultrasound room at the discretion of the ultrasound technician under special circumstances. We apologize for any inconvenience.  ZIO XT- Long Term Monitor Instructions  Your physician has requested you wear a ZIO patch monitor for 14 days.  This is a single patch monitor. [employer] supplies one patch monitor per enrollment. Additional stickers are not available. Please do not apply patch if you will be having a Nuclear Stress Test,  Echocardiogram, Cardiac CT, MRI, or Chest Xray during the period you would be wearing the  monitor. The patch cannot be worn during these tests. You cannot remove and re-apply the  ZIO XT patch monitor.  Your ZIO patch monitor will be mailed 3 day USPS to your  address on file. It may take 3-5 days  to receive your monitor after you have been enrolled.  Once you have received your monitor, please review the enclosed instructions. Your monitor  has already been registered assigning a specific monitor serial # to you.  Billing and Patient Assistance Program Information  We have supplied [employer] with any of your insurance information on file for billing purposes. [employer] offers a sliding scale Patient Assistance Program for patients that do not have  insurance, or whose insurance does not completely cover the cost of the ZIO monitor.  You must apply for the Patient Assistance Program to qualify for this discounted rate.  To apply, please call [employer] at [phone], select option 4, select option 2, ask to apply for  Patient Assistance Program. Meredeth Ide will ask your household income, and how many people  are in your household. They will quote your out-of-pocket cost based on that information.  [employer] will also be able to set up a 3-month, interest-free payment plan if needed.  Applying the monitor   Shave hair from upper left chest.  Hold abrader disc by orange tab. Rub abrader in 40 strokes over the upper left chest as  indicated in your monitor instructions.  Clean area with 4 enclosed alcohol pads. Let dry.  Apply patch as indicated in monitor instructions. Patch will be placed under collarbone on left  side of chest with arrow pointing upward.  Rub patch adhesive wings for 2 minutes. Remove white label marked "1". Remove the white  label marked "2". Rub patch adhesive wings for 2 additional minutes.  While looking in a mirror, press and release  button in center of patch. A small green light will  flash 3-4 times. This will be your only indicator that the monitor has been turned on.  Do not shower for the first 24 hours. You may shower after the first 24 hours.  Press the button if you feel a symptom. You will hear a small click. Record  Date, Time and  Symptom in the Patient Logbook.  When you are ready to remove the patch, follow instructions on the last 2 pages of Patient  Logbook. Stick patch monitor onto the last page of Patient Logbook.  Place Patient Logbook in the blue and white box. Use locking tab on box and tape box closed  securely. The blue and white box has prepaid postage on it. Please place it in the mailbox as  soon as possible. Your physician should have your test results approximately 7 days after the  monitor has been mailed back to [facility].  Call [facility] Customer Care at [phone] if you have questions regarding  your ZIO XT patch monitor. Call them immediately if you see an orange light blinking on your  monitor.  If your monitor falls off in less than 4 days, contact our Monitor department at [phone].  If your monitor becomes loose or falls off after 4 days call [employer] at [phone] for  suggestions on securing your monitor    Follow-Up: At [facility], you and your health needs are our priority.  As part of our continuing mission to provide you with exceptional heart care, we have created designated Provider Care Teams.  These Care Teams include your primary Cardiologist (physician) and Advanced Practice Providers (APPs -  Physician Assistants and Nurse Practitioners) who all work together to provide you with the care you need, when you need it.  Your next appointment:   16 week(s)  Provider:   Thomasene Ripple, DO

## 2023-09-10 NOTE — Progress Notes (Unsigned)
Enrolled patient for a 14 day Zio XT  monitor to be mailed to patients home  °

## 2023-09-10 NOTE — Telephone Encounter (Signed)
Per review of telephone encounter dated 12/4, waiting for benefits for Evenity.   Routing to Dr. Karma Greaser to advise on Fosamax in the meantime.   Cc: Irving Burton c

## 2023-09-10 NOTE — Telephone Encounter (Signed)
Rx pended, routing to Dr. Karma Greaser.

## 2023-09-11 DIAGNOSIS — H2512 Age-related nuclear cataract, left eye: Secondary | ICD-10-CM | POA: Diagnosis not present

## 2023-10-03 ENCOUNTER — Encounter: Payer: Self-pay | Admitting: Cardiology

## 2023-10-14 ENCOUNTER — Ambulatory Visit (HOSPITAL_COMMUNITY): Admission: RE | Admit: 2023-10-14 | Payer: Medicare HMO | Source: Ambulatory Visit

## 2023-10-16 NOTE — Telephone Encounter (Signed)
Insurance information resubmitted to [employer] portal with start of new calendar year. Will await summary of benefits for Evenity.

## 2023-10-22 ENCOUNTER — Ambulatory Visit (HOSPITAL_COMMUNITY): Payer: Medicare HMO

## 2023-10-29 DIAGNOSIS — R002 Palpitations: Secondary | ICD-10-CM

## 2023-11-03 DIAGNOSIS — R052 Subacute cough: Secondary | ICD-10-CM | POA: Diagnosis not present

## 2023-11-03 DIAGNOSIS — J3081 Allergic rhinitis due to animal (cat) (dog) hair and dander: Secondary | ICD-10-CM | POA: Diagnosis not present

## 2023-11-03 DIAGNOSIS — J3089 Other allergic rhinitis: Secondary | ICD-10-CM | POA: Diagnosis not present

## 2023-11-03 DIAGNOSIS — K219 Gastro-esophageal reflux disease without esophagitis: Secondary | ICD-10-CM | POA: Diagnosis not present

## 2023-11-10 DIAGNOSIS — D225 Melanocytic nevi of trunk: Secondary | ICD-10-CM | POA: Diagnosis not present

## 2023-11-10 DIAGNOSIS — L814 Other melanin hyperpigmentation: Secondary | ICD-10-CM | POA: Diagnosis not present

## 2023-11-10 DIAGNOSIS — Z7189 Other specified counseling: Secondary | ICD-10-CM | POA: Diagnosis not present

## 2023-11-10 DIAGNOSIS — L570 Actinic keratosis: Secondary | ICD-10-CM | POA: Diagnosis not present

## 2023-11-10 DIAGNOSIS — Z85828 Personal history of other malignant neoplasm of skin: Secondary | ICD-10-CM | POA: Diagnosis not present

## 2023-11-10 DIAGNOSIS — L821 Other seborrheic keratosis: Secondary | ICD-10-CM | POA: Diagnosis not present

## 2023-11-10 DIAGNOSIS — Z08 Encounter for follow-up examination after completed treatment for malignant neoplasm: Secondary | ICD-10-CM | POA: Diagnosis not present

## 2023-11-18 DIAGNOSIS — R002 Palpitations: Secondary | ICD-10-CM | POA: Diagnosis not present

## 2023-11-24 ENCOUNTER — Ambulatory Visit (HOSPITAL_COMMUNITY)
Admission: RE | Admit: 2023-11-24 | Discharge: 2023-11-24 | Disposition: A | Discharge status: Home / Self Care | Payer: Medicare HMO | Source: Ambulatory Visit | Attending: Cardiology | Admitting: Cardiology

## 2023-11-24 DIAGNOSIS — I341 Nonrheumatic mitral (valve) prolapse: Secondary | ICD-10-CM | POA: Insufficient documentation

## 2023-11-24 LAB — ECHOCARDIOGRAM COMPLETE
AR max vel: 2.12 cm2
AV Area VTI: 2.02 cm2
AV Area mean vel: 1.99 cm2
AV Mean grad: 4 mmHg
AV Peak grad: 7.7 mmHg
Ao pk vel: 1.39 m/s
Area-P 1/2: 3.46 cm2
MV M vel: 2.81 m/s
MV Peak grad: 31.6 mmHg
S' Lateral: 2.27 cm

## 2023-11-26 ENCOUNTER — Encounter: Payer: Self-pay | Admitting: Cardiology

## 2023-11-28 ENCOUNTER — Other Ambulatory Visit: Payer: Self-pay

## 2023-11-28 MED ORDER — METOPROLOL SUCCINATE ER 50 MG PO TB24: 50.0000 mg | ORAL_TABLET | Freq: Two times a day (BID) | ORAL | 3 refills | Status: AC

## 2023-12-23 ENCOUNTER — Other Ambulatory Visit: Payer: Self-pay

## 2023-12-23 DIAGNOSIS — M810 Age-related osteoporosis without current pathological fracture: Secondary | ICD-10-CM

## 2023-12-23 MED ORDER — ALENDRONATE SODIUM 70 MG PO TABS: ORAL_TABLET | ORAL | 0 refills | Status: DC

## 2023-12-23 NOTE — Telephone Encounter (Signed)
 Med refill request: Fosomax Last AEX: 08/15/2022 EB Next AEX:not scheduled, will send message to front desk to schedule annual visit Last MMG (if hormonal med) 05/02/2022 Refill authorized: Last Rx sent #12 with zero refills on 09/10/2023. Please approve or deny as appropriate.

## 2023-12-25 ENCOUNTER — Ambulatory Visit: Payer: Medicare HMO | Admitting: Cardiology

## 2023-12-29 ENCOUNTER — Telehealth: Payer: Self-pay | Admitting: Adult Health

## 2023-12-29 ENCOUNTER — Other Ambulatory Visit: Payer: Self-pay

## 2023-12-29 DIAGNOSIS — F411 Generalized anxiety disorder: Secondary | ICD-10-CM

## 2023-12-29 MED ORDER — LORAZEPAM 1 MG PO TABS: ORAL_TABLET | ORAL | 1 refills | Status: DC

## 2023-12-29 NOTE — Telephone Encounter (Signed)
Pended lorazepam

## 2023-12-29 NOTE — Telephone Encounter (Signed)
 Patient called in for refill on Lorazepam 1mg . Ph: [phone] [id number] [facility] [address] [place]

## 2024-02-12 NOTE — Telephone Encounter (Signed)
 Call to patient. Patient states that she last took Fosamax  on 02/08/24. RN advised would need to wait 6 months from last Fosamax  to starting Evenity  for most efficacy of medication. Patient agreeable and states she definitely wants to maximize the benefits of the Evenity  and is okay to wait until November to start if that is what Dr. Tia Flowers recommends. Patient states she is experiencing some reflux from the Fosamax . RN advised would review with Dr. Tia Flowers and return call with recommendations. Patient agreeable.   Routing to provider for review.

## 2024-02-18 NOTE — Telephone Encounter (Signed)
Message left to return call to [place] at [phone].

## 2024-02-18 NOTE — Telephone Encounter (Signed)
 Patient returned call. Advised patient to stop Fosamax  now and will follow up in November for updated insurance benefits verification. Patient verbalized understanding and agreeable.   Encounter closed.

## 2024-02-28 ENCOUNTER — Other Ambulatory Visit: Payer: Self-pay | Admitting: Internal Medicine

## 2024-03-08 ENCOUNTER — Telehealth: Admitting: Adult Health

## 2024-03-08 ENCOUNTER — Encounter: Payer: Self-pay | Admitting: Adult Health

## 2024-03-08 DIAGNOSIS — F3181 Bipolar II disorder: Secondary | ICD-10-CM | POA: Diagnosis not present

## 2024-03-08 DIAGNOSIS — G4700 Insomnia, unspecified: Secondary | ICD-10-CM | POA: Diagnosis not present

## 2024-03-08 DIAGNOSIS — F411 Generalized anxiety disorder: Secondary | ICD-10-CM

## 2024-03-08 MED ORDER — LORAZEPAM 1 MG PO TABS: ORAL_TABLET | ORAL | 2 refills | Status: DC

## 2024-03-08 MED ORDER — SERTRALINE HCL 100 MG PO TABS: ORAL_TABLET | ORAL | 1 refills | Status: DC

## 2024-03-08 NOTE — Progress Notes (Signed)
 Monique [name] [id number] 09/09/1956 68 y.o.  Virtual Visit via Video Note  I connected with pt @ on 03/08/24 at  3:00 PM EDT by a video enabled telemedicine application and verified that I am speaking with the correct person using two identifiers.   I discussed the limitations of evaluation and management by telemedicine and the availability of in person appointments. The patient expressed understanding and agreed to proceed.  I discussed the assessment and treatment plan with the patient. The patient was provided an opportunity to ask questions and all were answered. The patient agreed with the plan and demonstrated an understanding of the instructions.   The patient was advised to call back or seek an in-person evaluation if the symptoms worsen or if the condition fails to improve as anticipated.  I provided 20 minutes of non-face-to-face time during this encounter.  The patient was located at home.  The provider was located at [facility].   Reagan Camera, NP   Subjective:   Patient ID:  Monique [name]. Peake is a 68 y.o. (DOB [birth year]) female.  Chief Complaint: No chief complaint on file.   HPI Alvaretta Eisenberger. Terriquez presents for follow-up of GAD, insomnia and BPD2.  Describes mood today as "ok" Pleasant. Tearful at times. Mood symptoms - reports "some" depression, anxiety and irritability. Reports lower interest and motivation. Denies panic attacks - periods of high anxiety. Reports some worry, rumination and over thinking. Reports mood is stable. Stating "I feel like I'm doing ok". Feels like her current medication regimen works well. Taking medications as prescribed. Energy levels "not great". Active, does not have a regular exercise routine. Enjoys some usual interests and activities. Married. Lives with husband. Family local. Spending time with family. Appetite adequate. Weight gain 129 to 134 pounds - 67". Sleeps well most nights. Averages 8 to 9 hours. Focus and  concentration "not great". Recent MRI was normal. Completing tasks - "pushing myself". Managing aspects of household. Working part-time.  Denies SI or HI.  Denies AH or VH. Denies self harm. Denies substance use.  Previous medication trials:  Denies   Review of Systems:  Review of Systems  Musculoskeletal:  Negative for gait problem.  Neurological:  Negative for tremors.  Psychiatric/Behavioral:         Please refer to HPI    Medications: I have reviewed the patient's current medications.  Current Outpatient Medications  Medication Sig Dispense Refill   alendronate  (FOSAMAX ) 70 MG tablet TAKE 1 TABLET(70 MG) BY MOUTH EVERY 7 DAYS WITH A FULL GLASS OF WATER AND ON AN EMPTY STOMACH 12 tablet 0   Azelastine HCl 137 MCG/SPRAY SOLN USE 1 TO 2 SPRAYS IN EACH NOSTRIL TWICE DAILY AS NEEDED     Biotin w/ Vitamins C & E (HAIR/SKIN/NAILS PO) Take by mouth.     Calcium Carbonate-Vit D-Min (CALCIUM 1200 PO) Take by mouth. Take 1 tab once daily.     Cholecalciferol 50 MCG (2000 UT) TABS Take 1 tablet by mouth daily.     levocetirizine (XYZAL) 5 MG tablet SMARTSIG:1 Tablet(s) By Mouth Every Evening     LORazepam  (ATIVAN ) 1 MG tablet TAKE 1 TO 2 TABLETS(1 TO 2 MG) BY MOUTH AT BEDTIME 60 tablet 1   MELATONIN PO Take 2 mg by mouth.     metoprolol  succinate (TOPROL  XL) 50 MG 24 hr tablet Take 1 tablet (50 mg total) by mouth 2 (two) times daily. Take with or immediately following a meal. 180 tablet 3   sertraline  (  ZOLOFT ) 100 MG tablet Take one and 1/2 tablets daily. 135 tablet 3   sodium chloride  (OCEAN) 0.65 % SOLN nasal spray Place 1 spray into both nostrils as needed for congestion.     traMADol  (ULTRAM ) 50 MG tablet Take 1 tablet (50 mg total) by mouth daily as needed. 10 tablet 0   valACYclovir  (VALTREX ) 500 MG tablet Take 1 tablet (500 mg total) by mouth 2 (two) times daily. 30 tablet 12   valACYclovir  (VALTREX ) 500 MG tablet Prophylaxis with 1 tab PO daily 90 tablet 4   Current  Facility-Administered Medications  Medication Dose Route Frequency Provider Last Rate Last Admin   Romosozumab -aqqg (EVENITY ) 105 MG/1. injection 210 mg  210 mg Subcutaneous Once         Medication Side Effects: None  Allergies:  Allergies  Allergen Reactions   Cat Dander    Dust Mite Extract     Past Medical History:  Diagnosis Date   Abnormal electrocardiogram    Allergic rhinitis    Allergy    seasonal allergies   Anxiety    on meds   Arthritis    R hip and left thumb   Bipolar disorder (HCC)    Cancer (HCC)    basal skin cell CA-1979   Cataracts, bilateral    COVID    Depression    some- on meds   GERD (gastroesophageal reflux disease)    not on meds at this time   History of hepatitis C    Hep B also   History of skin cancer 1980   HSV infection    Memory loss    MVP (mitral valve prolapse)    Osteoporosis    Paresthesia    Unspecified chronic bronchitis (HCC)     Family History  Problem Relation Age of Onset   COPD Mother    Allergies Mother    Heart disease Mother    Atrial fibrillation Mother    Heart failure Mother    Colon cancer Mother 6       Had 6 inches, resected.   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  Tobacco Use   Smoking status: Former    Current packs/day: 0.00    Average packs/day: 1.5  packs/day for 25.0 years (37.5 ttl pk-yrs)    Types: Cigarettes    Start date: 10/01/1971    Quit date: 09/30/1996    Years since quitting: 27.4   Smokeless tobacco: Never  Vaping Use   Vaping status: Never Used  Substance and Sexual Activity   Alcohol use: Not Currently   Drug use: No   Sexual activity:  Not Currently    Partners: Male    Birth control/protection: Post-menopausal, Surgical    Comment: 1st intercourse- 16, partners- more than 5, btl  Other Topics Concern   Not on file  Social History Narrative   1 cup caffeine daily   Married   No children   Daughter of marjorie herndon   Social Drivers of Health   Financial Resource Strain: Not on file  Food Insecurity: Not on file  Transportation Needs: Not on file  Physical Activity: Not on file  Stress: Not on file  Social Connections: Not on file  Intimate Partner Violence: Not on file    Past Medical History, Surgical history, Social history, and Family history were reviewed and updated as appropriate.   Please see review of systems for further details on the patient's review from today.   Objective:   Physical Exam:  There were no vitals taken for this visit.  Physical Exam Constitutional:      General: She is not in acute distress. Musculoskeletal:        General: No deformity.  Neurological:     Mental Status: She is alert and oriented to person, place, and time.     Coordination: Coordination normal.  Psychiatric:        Attention and Perception: Attention and perception normal. She does not perceive auditory or visual hallucinations.        Mood and Affect: Mood normal. Mood is not anxious or depressed. Affect is not labile, blunt, angry or inappropriate.        Speech: Speech normal.        Behavior: Behavior normal.        Thought Content: Thought content normal. Thought content is not paranoid or delusional. Thought content does not include homicidal or suicidal ideation. Thought content does not  include homicidal or suicidal plan.        Cognition and Memory: Cognition and memory normal.        Judgment: Judgment normal.     Comments: Insight intact     Lab Review:     Component Value Date/Time   NA 139 04/25/2023 1454   K 4.5 04/25/2023 1454   CL 101 04/25/2023 1454   CO2 31 04/25/2023 1454   GLUCOSE 87 04/25/2023 1454   BUN 16 04/25/2023 1454   CREATININE 0.79 04/25/2023 1454   CREATININE 0.76 04/27/2020 1446   CALCIUM 9.6 04/25/2023 1454   PROT 7.8 04/25/2023 1454   ALBUMIN 4.6 04/25/2023 1454   AST 22 04/25/2023 1454   ALT 12 04/25/2023 1454   ALKPHOS 63 04/25/2023 1454   BILITOT 0.4 04/25/2023 1454   GFRNONAA 92.72 08/17/2009 1022   GFRAA 113 08/03/2007 1638       Component Value Date/Time   WBC 5.6 04/25/2023 1454   RBC 4.42 04/25/2023 1454   HGB 13.4 04/25/2023 1454   HCT 41.8 04/25/2023 1454   PLT 236.0 04/25/2023 1454   MCV 94.7 04/25/2023 1454   MCH 31.6 04/27/2020 1446   MCHC 32.1 04/25/2023 1454   RDW 15.2 04/25/2023 1454   LYMPHSABS 1.5 07/29/2014 1117   MONOABS 0.5 07/29/2014 1117   EOSABS 0.1 07/29/2014 1117   BASOSABS 0.0 07/29/2014 1117    No results found for: "POCLITH", "LITHIUM"   No results found for: "PHENYTOIN", "PHENOBARB", "VALPROATE", "CBMZ"   .res Assessment: Plan:    Plan:  PDMP reviewed  Ativan  1mg  - take one and 1/2 tabs daily as needed. Zoloft  150mg  daily  20  minutes spent dedicated to the care of this patient on the date of this encounter to include pre-visit review of records, ordering of medication, post visit documentation, and face-to-face time with the patient discussing GAD, insomnia and BPD2. Discussed continuing current medication regimen.  Discussed potential benefits, risk, and side effects of benzodiazepines to include potential risk of tolerance and dependence, as well as possible drowsiness. Advised patient not to drive if experiencing drowsiness and to take lowest possible effective dose to minimize  risk of dependence and tolerance.   RTC 6 months  There are no diagnoses linked to this encounter.   Please see After Visit Summary for patient specific instructions.  Future Appointments  Date Time Provider Department Center  03/08/2024  3:00 PM Aubrianne Molyneux, Ursula Gardner, NP [facility] None  03/26/2024 11:40 AM Adelia Homestead, MD [facility] None  04/13/2024 11:00 AM Tobb, Kardie, DO [facility]    No orders of the defined types were placed in this encounter.     -------------------------------

## 2024-03-17 DIAGNOSIS — R051 Acute cough: Secondary | ICD-10-CM | POA: Diagnosis not present

## 2024-03-17 DIAGNOSIS — Z20822 Contact with and (suspected) exposure to covid-19: Secondary | ICD-10-CM | POA: Diagnosis not present

## 2024-03-26 ENCOUNTER — Ambulatory Visit: Admitting: Internal Medicine

## 2024-03-26 ENCOUNTER — Other Ambulatory Visit: Payer: Self-pay | Admitting: Obstetrics and Gynecology

## 2024-03-26 DIAGNOSIS — M810 Age-related osteoporosis without current pathological fracture: Secondary | ICD-10-CM

## 2024-03-26 NOTE — Telephone Encounter (Signed)
 Med refill request: alendronate  70 mg Last AEX: 08/18/23 Next AEX: none scheduled Last MMG (if hormonal med) n/a DEXA: 09/02/23 Refill authorized: Please approve or deny as appropriate.

## 2024-04-13 ENCOUNTER — Ambulatory Visit: Attending: Cardiology | Admitting: Cardiology

## 2024-04-13 ENCOUNTER — Encounter: Payer: Self-pay | Admitting: Cardiology

## 2024-04-13 VITALS — BP 102/66 | HR 80 | Ht 66.0 in | Wt 137.0 lb

## 2024-04-13 DIAGNOSIS — I471 Supraventricular tachycardia, unspecified: Secondary | ICD-10-CM | POA: Diagnosis not present

## 2024-04-13 NOTE — Patient Instructions (Signed)

## 2024-04-13 NOTE — Progress Notes (Unsigned)
 Cardiology Office Note:    Date:  04/20/2024   ID:  Monique [name], Monique [name] 02-Oct-1955, MRN [id number]  PCP:  Rollene Almarie LABOR, MD  Cardiologist:  Dub Huntsman, DO  Electrophysiologist:  None   Referring MD: Rollene Almarie LABOR, *    I am   History of Present Illness:    Monique [name] is a 68 y.o. female with a hx of bipolar disorder,anxiety and supraventricular tachycardia.who presents for follow-up regarding her arrhythmia management.  She experiences episodes of supraventricular tachycardia with a maximum heart rate recorded in the 200s. A monitor documented approximately 138 episodes, some symptomatic and others asymptomatic, occurring both day and night.  She is on long-acting metoprolol , taking two doses per day, and does not experience dizziness or lightheadedness.  Her mother had congestive heart failure, which heightens her concern for her own heart health. She remains vigilant about her cardiovascular status.  She has no shortness of breath and has not been hospitalized since her last visit.  Past Medical History:  Diagnosis Date   Abnormal electrocardiogram    Allergic rhinitis    Allergy    seasonal allergies   Anxiety    on meds   Arthritis    R hip and left thumb   Bipolar disorder (HCC)    Cancer (HCC)    basal skin cell CA-1979   Cataracts, bilateral    COVID    Depression    some- on meds   GERD (gastroesophageal reflux disease)    not on meds at this time   History of hepatitis C    Hep B also   History of skin cancer 1980   HSV infection    Memory loss    MVP (mitral valve prolapse)    Osteoporosis    Paresthesia    Unspecified chronic bronchitis (HCC)     Past Surgical History:  Procedure Laterality Date   APPENDECTOMY  11/08   COLONOSCOPY  2014   MOHS SURGERY     RHINOPLASTY     TUBAL LIGATION     bilateral    Current Medications: Current Meds  Medication Sig   Azelastine HCl 137 MCG/SPRAY SOLN USE 1 TO 2 SPRAYS IN EACH  NOSTRIL TWICE DAILY AS NEEDED   Biotin w/ Vitamins C & E (HAIR/SKIN/NAILS PO) Take by mouth.   Calcium Carbonate-Vit D-Min (CALCIUM 1200 PO) Take by mouth. Take 1 tab once daily.   levocetirizine (XYZAL) 5 MG tablet SMARTSIG:1 Tablet(s) By Mouth Every Evening   LORazepam  (ATIVAN ) 1 MG tablet TAKE 1 TO 2 TABLETS(1 TO 2 MG) BY MOUTH AT BEDTIME   MELATONIN PO Take 2 mg by mouth.   metoprolol  succinate (TOPROL  XL) 50 MG 24 hr tablet Take 1 tablet (50 mg total) by mouth 2 (two) times daily. Take with or immediately following a meal.   sertraline  (ZOLOFT ) 100 MG tablet Take two tablets daily.   sodium chloride  (OCEAN) 0.65 % SOLN nasal spray Place 1 spray into both nostrils as needed for congestion.   traMADol  (ULTRAM ) 50 MG tablet Take 1 tablet (50 mg total) by mouth daily as needed.   valACYclovir  (VALTREX ) 500 MG tablet Take 1 tablet (500 mg total) by mouth 2 (two) times daily.   Current Facility-Administered Medications for the 04/13/24 encounter (Office Visit) with Marnita Poirier, DO  Medication   Romosozumab -aqqg (EVENITY ) 105 MG/1. 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  Tobacco Use   Smoking status: Former    Current packs/day: 0.00    Average packs/day: 1.5 packs/day for 25.0 years (37.5 ttl pk-yrs)    Types: Cigarettes    Start date: 10/01/1971    Quit date: 09/30/1996    Years since quitting: 27.5   Smokeless tobacco: Never  Vaping Use   Vaping status: Never Used  Substance and Sexual Activity   Alcohol use: Not Currently   Drug use: No   Sexual activity: Not Currently    Partners: Male    Birth control/protection: Post-menopausal, Surgical     Comment: 1st intercourse- 16, partners- more than 5, btl  Other Topics Concern   Not on file  Social History Narrative   1 cup caffeine daily   Married   No children   Daughter of marjorie herndon   Social Drivers of Health   Financial Resource Strain: Low Risk  (03/22/2024)   Overall Financial Resource Strain (CARDIA)    Difficulty of Paying Living Expenses: Not hard at all  Food Insecurity: No Food Insecurity (03/22/2024)   Hunger Vital Sign    Worried About Running Out of Food in the Last Year: Never true    Ran Out of Food in the Last Year: Never true  Transportation Needs: No Transportation Needs (03/22/2024)   PRAPARE - Administrator, Civil Service (Medical): No    Lack of Transportation (Non-Medical): No  Physical Activity: Insufficiently Active (03/22/2024)   Exercise Vital Sign    Days of Exercise per Week: 1 day    Minutes of Exercise per Session: 20 min  Stress: No Stress Concern Present (03/22/2024)   [employer] of Occupational Health - Occupational Stress Questionnaire    Feeling of Stress: Not at all  Social Connections: Unknown (03/22/2024)   Social Connection and Isolation Panel    Frequency of Communication with Friends and Family: Twice a week    Frequency of Social Gatherings with Friends and Family: Once a week    Attends Religious Services: Never    Diplomatic Services operational officer: Not on file    Attends Engineer, structural: Not on file    Marital Status: Married     Family History: The patient's family history includes Allergies in her father and mother; Asthma in an other family member; Atrial fibrillation in her mother; Bladder Cancer in her father; COPD in her mother and paternal uncle; Cancer - Other (age of onset: 37) in her father; Colon cancer (age of onset: 29) in her mother; Colon polyps (age of onset: 95) in her brother; Colon polyps (age of onset: 35) in her mother; Heart disease in her mother; Heart failure in  her mother; Pancreatic cancer (age of onset: 32) in her paternal grandmother; Prostate cancer (age of onset: 39) in her father; Rheum arthritis in her father. There is no history of Esophageal cancer, Stomach cancer, Rectal cancer, or Breast cancer.  ROS:   Review of Systems  Constitution: Negative for decreased appetite, fever and weight gain.  HENT: Negative for congestion, ear discharge, hoarse voice and sore throat.   Eyes: Negative for discharge, redness, vision loss in  right eye and visual halos.  Cardiovascular: Negative for chest pain, dyspnea on exertion, leg swelling, orthopnea and palpitations.  Respiratory: Negative for cough, hemoptysis, shortness of breath and snoring.   Endocrine: Negative for heat intolerance and polyphagia.  Hematologic/Lymphatic: Negative for bleeding problem. Does not bruise/bleed easily.  Skin: Negative for flushing, nail changes, rash and suspicious lesions.  Musculoskeletal: Negative for arthritis, joint pain, muscle cramps, myalgias, neck pain and stiffness.  Gastrointestinal: Negative for abdominal pain, bowel incontinence, diarrhea and excessive appetite.  Genitourinary: Negative for decreased libido, genital sores and incomplete emptying.  Neurological: Negative for brief paralysis, focal weakness, headaches and loss of balance.  Psychiatric/Behavioral: Negative for altered mental status, depression and suicidal ideas.  Allergic/Immunologic: Negative for HIV exposure and persistent infections.    EKGs/Labs/Other Studies Reviewed:    The following studies were reviewed today:   EKG:  The ekg ordered today demonstrates   Recent Labs: 04/25/2023: ALT 12; BUN 16; Creatinine, Ser 0.79; Hemoglobin 13.4; Platelets 236.0; Potassium 4.5; Sodium 139  Recent Lipid Panel    Component Value Date/Time   CHOL 193 04/25/2023 1454   TRIG 84.0 04/25/2023 1454   HDL 80.40 04/25/2023 1454   CHOLHDL 2 04/25/2023 1454   VLDL 16.8 04/25/2023 1454   LDLCALC 96  04/25/2023 1454   LDLCALC 71 04/27/2020 1446    Physical Exam:    VS:  BP 102/66   Pulse 80   Ht 5' 6 (1.676 m)   Wt 137 lb (62.1 kg)   SpO2 97%   BMI 22.11 kg/m     Wt Readings from Last 3 Encounters:  04/13/24 137 lb (62.1 kg)  09/10/23 130 lb 12.8 oz (59.3 kg)  08/18/23 123 lb (55.8 kg)     GEN: Well nourished, well developed in no acute distress HEENT: Normal NECK: No JVD; No carotid bruits LYMPHATICS: No lymphadenopathy CARDIAC: S1S2 noted,RRR, no murmurs, rubs, gallops RESPIRATORY:  Clear to auscultation without rales, wheezing or rhonchi  ABDOMEN: Soft, non-tender, non-distended, +bowel sounds, no guarding. EXTREMITIES: No edema, No cyanosis, no clubbing MUSCULOSKELETAL:  No deformity  SKIN: Warm and dry NEUROLOGIC:  Alert and oriented x 3, non-focal PSYCHIATRIC:  Normal affect, good insight  ASSESSMENT:    1. PSVT (paroxysmal supraventricular tachycardia) (HCC)    PLAN:    Supraventricular tachycardia (SVT) - Experienced 138 SVT episodes, some symptomatic, with heart rates in the 200s. Normal echocardiogram. Long-acting metoprolol  effective in reducing symptoms and episode frequency. Continued monitoring necessary due to family history of congestive heart failure. - Continue long-acting metoprolol  twice daily. - Schedule annual follow-up visits to monitor SVT. - Apply cardiac monitor at annual visit to assess SVT frequency. - Instruct her to report any changes or symptoms immediately.  The patient is in agreement with the above plan. The patient left the office in stable condition.  The patient will follow up in   Medication Adjustments/Labs and Tests Ordered: Current medicines are reviewed at length with the patient today.  Concerns regarding medicines are outlined above.  No orders of the defined types were placed in this encounter.  No orders of the defined types were placed in this encounter.   Patient Instructions  Medication Instructions:  Your  physician recommends that you continue on your current medications as directed. Please refer to the Current Medication list given to you today.  *If you need a refill on your cardiac medications before your next appointment, please call your pharmacy*  Follow-Up: At [facility], you and your health  needs are our priority.  As part of our continuing mission to provide you with exceptional heart care, our providers are all part of one team.  This team includes your primary Cardiologist (physician) and Advanced Practice Providers or APPs (Physician Assistants and Nurse Practitioners) who all work together to provide you with the care you need, when you need it.  Your next appointment:   1 year(s)  Provider:   Chyane Greer, DO          Adopting a Healthy Lifestyle.  Know what a healthy weight is for you (roughly BMI <25) and aim to maintain this   Aim for 7+ servings of fruits and vegetables daily   65-80+ fluid ounces of water or unsweet tea for healthy kidneys   Limit to max 1 drink of alcohol per day; avoid smoking/tobacco   Limit animal fats in diet for cholesterol and heart health - choose grass fed whenever available   Avoid highly processed foods, and foods high in saturated/trans fats   Aim for low stress - take time to unwind and care for your mental health   Aim for 150 min of moderate intensity exercise weekly for heart health, and weights twice weekly for bone health   Aim for 7-9 hours of sleep daily   When it comes to diets, agreement about the perfect plan isnt easy to find, even among the experts. Experts at the [facility] [employer] developed an idea known as the Healthy Eating Plate. Just imagine a plate divided into logical, healthy portions.   The emphasis is on diet quality:   Load up on vegetables and fruits - one-half of your plate: Aim for color and variety, and remember that potatoes dont count.   Go for whole grains - one-quarter of  your plate: Whole wheat, barley, wheat berries, quinoa, oats, brown rice, and foods made with them. If you want pasta, go with whole wheat pasta.   Protein power - one-quarter of your plate: Fish, chicken, beans, and nuts are all healthy, versatile protein sources. Limit red meat.   The diet, however, does go beyond the plate, offering a few other suggestions.   Use healthy plant oils, such as olive, canola, soy, corn, sunflower and peanut. Check the labels, and avoid partially hydrogenated oil, which have unhealthy trans fats.   If youre thirsty, drink water. Coffee and tea are good in moderation, but skip sugary drinks and limit milk and dairy products to one or two daily servings.   The type of carbohydrate in the diet is more important than the amount. Some sources of carbohydrates, such as vegetables, fruits, whole grains, and beans-are healthier than others.   Finally, stay active  Signed, Dub Huntsman, DO  04/20/2024 5:10 PM    [facility]

## 2024-04-27 ENCOUNTER — Ambulatory Visit: Admitting: Internal Medicine

## 2024-04-27 ENCOUNTER — Encounter: Payer: Self-pay | Admitting: Internal Medicine

## 2024-04-27 VITALS — BP 116/80 | HR 83 | Temp 98.3°F | Ht 66.0 in | Wt 134.0 lb

## 2024-04-27 DIAGNOSIS — Z833 Family history of diabetes mellitus: Secondary | ICD-10-CM

## 2024-04-27 DIAGNOSIS — M810 Age-related osteoporosis without current pathological fracture: Secondary | ICD-10-CM | POA: Diagnosis not present

## 2024-04-27 DIAGNOSIS — F319 Bipolar disorder, unspecified: Secondary | ICD-10-CM | POA: Diagnosis not present

## 2024-04-27 DIAGNOSIS — M797 Fibromyalgia: Secondary | ICD-10-CM | POA: Diagnosis not present

## 2024-04-27 DIAGNOSIS — Z0000 Encounter for general adult medical examination without abnormal findings: Secondary | ICD-10-CM | POA: Diagnosis not present

## 2024-04-27 DIAGNOSIS — R413 Other amnesia: Secondary | ICD-10-CM | POA: Diagnosis not present

## 2024-04-27 LAB — COMPREHENSIVE METABOLIC PANEL WITH GFR
ALT: 13 U/L (ref 0–35)
AST: 21 U/L (ref 0–37)
Albumin: 4.4 g/dL (ref 3.5–5.2)
Alkaline Phosphatase: 56 U/L (ref 39–117)
BUN: 17 mg/dL (ref 6–23)
CO2: 30 meq/L (ref 19–32)
Calcium: 9.3 mg/dL (ref 8.4–10.5)
Chloride: 101 meq/L (ref 96–112)
Creatinine, Ser: 0.84 mg/dL (ref 0.40–1.20)
GFR: 71.32 mL/min (ref >60.00)
Glucose, Bld: 79 mg/dL (ref 70–99)
Potassium: 4.9 meq/L (ref 3.5–5.1)
Sodium: 137 meq/L (ref 135–145)
Total Bilirubin: 0.4 mg/dL (ref 0.2–1.2)
Total Protein: 7.4 g/dL (ref 6.0–8.3)

## 2024-04-27 LAB — VITAMIN B12: Vitamin B-12: 384 pg/mL (ref 211–911)

## 2024-04-27 LAB — CBC
HCT: 40.8 % (ref 36.0–46.0)
Hemoglobin: 13.6 g/dL (ref 12.0–15.0)
MCHC: 33.4 g/dL (ref 30.0–36.0)
MCV: 93.7 fl (ref 78.0–100.0)
Platelets: 220 K/uL (ref 150.0–400.0)
RBC: 4.36 Mil/uL (ref 3.87–5.11)
RDW: 14.2 % (ref 11.5–15.5)
WBC: 6.5 K/uL (ref 4.0–10.5)

## 2024-04-27 LAB — LIPID PANEL
Cholesterol: 162 mg/dL (ref 0–200)
HDL: 67 mg/dL (ref >39.00)
LDL Cholesterol: 79 mg/dL (ref 0–99)
NonHDL: 95.08
Total CHOL/HDL Ratio: 2
Triglycerides: 78 mg/dL (ref 0.0–149.0)
VLDL: 15.6 mg/dL (ref 0.0–40.0)

## 2024-04-27 LAB — VITAMIN D 25 HYDROXY (VIT D DEFICIENCY, FRACTURES): VITD: 38.56 ng/mL (ref 30.00–100.00)

## 2024-04-27 LAB — TSH: TSH: 2.17 u[IU]/mL (ref 0.35–5.50)

## 2024-04-27 NOTE — Assessment & Plan Note (Signed)
 Overall stable checking B12, TSH, vitamin D  and CBC to assess etiology.

## 2024-04-27 NOTE — Assessment & Plan Note (Signed)
 Checking CBC and CMP and lipid panel.

## 2024-04-27 NOTE — Progress Notes (Signed)
 Subjective:   Patient ID: Monique [name], female    DOB: [birth year], 68 y.o.   MRN: [id number]  HPI Here for medicare wellness and physical, no new complaints. Please see A/P for status and treatment of chronic medical problems.   Diet: heart healthy  Physical activity: active Depression/mood screen: negative Hearing: intact to whispered voice, mild loss bilaterally Visual acuity: grossly normal with readers, performs annual eye exam  ADLs: capable Fall risk: none Home safety: good Cognitive evaluation: intact to orientation, naming, recall and repetition EOL planning: adv directives discussed  Flowsheet Row Office Visit from 04/27/2024 in [facility] [place]  PHQ-2 Total Score 1    Flowsheet Row Office Visit from 04/27/2024 in [facility] [place]  PHQ-9 Total Score 9      06/28/2020    1:17 PM 01/14/2022    2:34 PM 02/27/2023    1:41 PM 04/25/2023    1:44 PM 04/27/2024    2:45 PM  Fall Risk  Falls in the past year?  0 0 0 0  Was there an injury with Fall?  0 0 0 0  Fall Risk Category Calculator  0 0 0 0  Fall Risk Category (Retired)  Low      (RETIRED) Patient Fall Risk Level Low fall risk       Patient at Risk for Falls Due to   No Fall Risks    Fall risk Follow up   Falls evaluation completed Falls evaluation completed Falls evaluation completed     Data saved with a previous flowsheet row definition    I have personally reviewed and have noted 1. The patient's medical and social history - reviewed today no changes 2. Their use of alcohol, tobacco or illicit drugs 3. Their current medications and supplements 4. The patient's functional ability including ADL's, fall risks, home safety risks and hearing or visual impairment. 5. Diet and physical activities 6. Evidence for depression or mood disorders 7. Care team reviewed and updated 8.  The patient is on an opioid pain medication tramadol  and this was discussed  with alternatives and she will maintain current regimen and have previously discussed safety.  Patient Care Team: Rollene Almarie LABOR, MD as PCP - General (Internal Medicine) Sheena Pugh, DO as PCP - Cardiology (Cardiology) Livingston Rigg, MD as Consulting Physician (Dermatology) Past Medical History:  Diagnosis Date   Abnormal electrocardiogram    Allergic rhinitis    Allergy    seasonal allergies   Anxiety    on meds   Arthritis    R hip and left thumb   Bipolar disorder (HCC)    Cancer (HCC)    basal skin cell CA-1979   Cataracts, bilateral    COVID    Depression    some- on meds   GERD (gastroesophageal reflux disease)    not on meds at this time   History of hepatitis C    Hep B also   History of skin cancer 1980   HSV infection    Memory loss    MVP (mitral valve prolapse)    Osteoporosis    Paresthesia    Unspecified chronic bronchitis (HCC)    Past Surgical History:  Procedure Laterality Date   APPENDECTOMY  11/08   COLONOSCOPY  2014   MOHS SURGERY     RHINOPLASTY     TUBAL LIGATION     bilateral   Family History  Problem Relation Age of Onset  COPD Mother    Allergies Mother    Heart disease Mother    Atrial fibrillation Mother    Heart failure Mother    Colon cancer Mother 53       Had 6 inches, resected.   Colon polyps Mother 58   Allergies Father    Rheum arthritis Father    Bladder Cancer Father    Prostate cancer Father 70   Cancer - Other Father 74       Bladder   COPD Paternal Uncle    Pancreatic cancer Paternal Grandmother 58   Colon polyps Brother 64   Asthma Other    Esophageal cancer Neg Hx    Stomach cancer Neg Hx    Rectal cancer Neg Hx    Breast cancer Neg Hx    Review of Systems  Constitutional:  Positive for fatigue.  HENT: Negative.    Eyes: Negative.   Respiratory:  Negative for cough, chest tightness and shortness of breath.   Cardiovascular:  Negative for chest pain, palpitations and leg swelling.   Gastrointestinal:  Negative for abdominal distention, abdominal pain, constipation, diarrhea, nausea and vomiting.  Musculoskeletal:  Positive for arthralgias.  Skin: Negative.   Neurological: Negative.   Psychiatric/Behavioral: Negative.      Objective:  Physical Exam Constitutional:      Appearance: She is well-developed.  HENT:     Head: Normocephalic and atraumatic.  Cardiovascular:     Rate and Rhythm: Normal rate and regular rhythm.  Pulmonary:     Effort: Pulmonary effort is normal. No respiratory distress.     Breath sounds: Normal breath sounds. No wheezing or rales.  Abdominal:     General: Bowel sounds are normal. There is no distension.     Palpations: Abdomen is soft.     Tenderness: There is no abdominal tenderness. There is no rebound.  Musculoskeletal:     Cervical back: Normal range of motion.  Skin:    General: Skin is warm and dry.  Neurological:     Mental Status: She is alert and oriented to person, place, and time.     Coordination: Coordination normal.     Vitals:   04/27/24 1438  BP: 116/80  Pulse: 83  Temp: 98.3 F (36.8 C)  TempSrc: Oral  SpO2: 94%  Weight: 134 lb (60.8 kg)  Height: 5' 6 (1.676 m)   Assessment & Plan:

## 2024-04-27 NOTE — Assessment & Plan Note (Signed)
Flu shot yearly. Pneumonia complete. Shingrix complete. Tetanus up to date. Colonoscopy up to date. Mammogram up to date, pap smear aged out and dexa up to date. Counseled about sun safety and mole surveillance. Counseled about the dangers of distracted driving. Given 10 year screening recommendations.   

## 2024-04-27 NOTE — Assessment & Plan Note (Signed)
 Overall stable and she maintains activity.

## 2024-04-27 NOTE — Assessment & Plan Note (Signed)
 Has GERD with fosamax  and gyn is arranging for evenity  infusions in near future.

## 2024-04-27 NOTE — Assessment & Plan Note (Signed)
 Stable on zoloft  and checking lipid and CMP.

## 2024-04-28 ENCOUNTER — Ambulatory Visit: Payer: Self-pay | Admitting: Internal Medicine

## 2024-07-20 DIAGNOSIS — K219 Gastro-esophageal reflux disease without esophagitis: Secondary | ICD-10-CM | POA: Diagnosis not present

## 2024-07-20 DIAGNOSIS — Z23 Encounter for immunization: Secondary | ICD-10-CM | POA: Diagnosis not present

## 2024-07-20 DIAGNOSIS — J3089 Other allergic rhinitis: Secondary | ICD-10-CM | POA: Diagnosis not present

## 2024-07-20 DIAGNOSIS — R052 Subacute cough: Secondary | ICD-10-CM | POA: Diagnosis not present

## 2024-07-29 ENCOUNTER — Other Ambulatory Visit: Payer: Self-pay | Admitting: Adult Health

## 2024-07-29 DIAGNOSIS — F411 Generalized anxiety disorder: Secondary | ICD-10-CM

## 2024-08-18 ENCOUNTER — Ambulatory Visit: Admitting: Obstetrics and Gynecology

## 2024-08-18 ENCOUNTER — Encounter: Payer: Self-pay | Admitting: Obstetrics and Gynecology

## 2024-08-18 VITALS — BP 108/68 | HR 64 | Ht 67.0 in | Wt 139.0 lb

## 2024-08-18 DIAGNOSIS — N952 Postmenopausal atrophic vaginitis: Secondary | ICD-10-CM | POA: Diagnosis not present

## 2024-08-18 DIAGNOSIS — D229 Melanocytic nevi, unspecified: Secondary | ICD-10-CM

## 2024-08-18 DIAGNOSIS — E049 Nontoxic goiter, unspecified: Secondary | ICD-10-CM | POA: Diagnosis not present

## 2024-08-18 DIAGNOSIS — B009 Herpesviral infection, unspecified: Secondary | ICD-10-CM | POA: Diagnosis not present

## 2024-08-18 DIAGNOSIS — M810 Age-related osteoporosis without current pathological fracture: Secondary | ICD-10-CM

## 2024-08-18 DIAGNOSIS — M8000XD Age-related osteoporosis with current pathological fracture, unspecified site, subsequent encounter for fracture with routine healing: Secondary | ICD-10-CM

## 2024-08-18 DIAGNOSIS — Z9189 Other specified personal risk factors, not elsewhere classified: Secondary | ICD-10-CM | POA: Diagnosis not present

## 2024-08-18 DIAGNOSIS — Z800 Family history of malignant neoplasm of digestive organs: Secondary | ICD-10-CM

## 2024-08-18 DIAGNOSIS — Z01419 Encounter for gynecological examination (general) (routine) without abnormal findings: Secondary | ICD-10-CM

## 2024-08-18 DIAGNOSIS — Z1231 Encounter for screening mammogram for malignant neoplasm of breast: Secondary | ICD-10-CM

## 2024-08-18 MED ORDER — ESTRADIOL 0.01 % VA CREA: 1.0000 | TOPICAL_CREAM | Freq: Every day | VAGINAL | 12 refills | Status: DC

## 2024-08-18 NOTE — Progress Notes (Signed)
 68 y.o. y.o. female here for annual medicare gyn exam. No LMP recorded. Patient is postmenopausal.   Established patient presenting for annual gyn exam    HPI: Postmenopause, well on no HRT.  No PMB.    Pap Neg in 2023.  Breast normal.  Mammo 04/2022 Neg. Osteoporosis on Fosamax  (having GERD and side effects)-PA placed for Evenity  and prolia , last BD T-Score -2.4 04/2021. Has had a fall with rib fracture. [facility] pharm with prolia  coverage and patient would agree to pay 20% for this.  Counseled on the medication. Rare genital HSV recurrences on Valacyclovir  prophylaxis, reports Monique [name] will have an outbreak without it. Body mass index 20.02 at last visit.  In good fitness.  Health labs with family physician.  Colono 06/2020 one polyp removed.  Mother with Colon Ca. Referral for repeat sent.  Body mass index is 21.77 kg/m.     Blood pressure 108/68, pulse 64, height 5' 7 (1.702 m), weight 139 lb (63 kg), SpO2 98%.     Component Value Date/Time   DIAGPAP  08/15/2022 1541    - Negative for intraepithelial lesion or malignancy (NILM)   ADEQPAP  08/15/2022 1541    Satisfactory for evaluation. The presence or absence of an   ADEQPAP  08/15/2022 1541    endocervical/transformation zone component cannot be determined because   ADEQPAP of atrophy. 08/15/2022 1541    GYN HISTORY:    Component Value Date/Time   DIAGPAP  08/15/2022 1541    - Negative for intraepithelial lesion or malignancy (NILM)   ADEQPAP  08/15/2022 1541    Satisfactory for evaluation. The presence or absence of an   ADEQPAP  08/15/2022 1541    endocervical/transformation zone component cannot be determined because   ADEQPAP of atrophy. 08/15/2022 1541    OB History  Gravida Para Term Preterm AB Living  0 0 0 0 0 0  SAB IAB Ectopic Multiple Live Births  0 0 0 0 0    Past Medical History:  Diagnosis Date   Abnormal electrocardiogram    Allergic rhinitis    Allergy    seasonal allergies   Anxiety    on meds    Arthritis    R hip and left thumb   Bipolar disorder (HCC)    Cancer (HCC)    basal skin cell CA-1979   Cataracts, bilateral    COVID    Depression    some- on meds   GERD (gastroesophageal reflux disease)    not on meds at this time   History of hepatitis C    Hep B also   History of skin cancer 1980   HSV infection    Memory loss    MVP (mitral valve prolapse)    Osteoporosis    Paresthesia    Unspecified chronic bronchitis (HCC)     Past Surgical History:  Procedure Laterality Date   APPENDECTOMY  11/08   COLONOSCOPY  2014   MOHS SURGERY     RHINOPLASTY     TUBAL LIGATION     bilateral    Current Outpatient Medications on File Prior to Visit  Medication Sig Dispense Refill   Azelastine HCl 137 MCG/SPRAY SOLN USE 1 TO 2 SPRAYS IN EACH NOSTRIL TWICE DAILY AS NEEDED     Biotin w/ Vitamins C & E (HAIR/SKIN/NAILS PO) Take by mouth.     Calcium Carbonate-Vit D-Min (CALCIUM 1200 PO) Take by mouth. Take 1 tab once daily.     Cholecalciferol 50  MCG (2000 UT) TABS Take 1 tablet by mouth daily.     levocetirizine (XYZAL) 5 MG tablet SMARTSIG:1 Tablet(s) By Mouth Every Evening     LORazepam  (ATIVAN ) 1 MG tablet TAKE 1 TO 2 TABLETS(1 TO 2 MG) BY MOUTH AT BEDTIME 60 tablet 1   MELATONIN PO Take 2 mg by mouth.     metoprolol  succinate (TOPROL  XL) 50 MG 24 hr tablet Take 1 tablet (50 mg total) by mouth 2 (two) times daily. Take with or immediately following a meal. 180 tablet 3   sertraline  (ZOLOFT ) 100 MG tablet Take two tablets daily. (Patient taking differently: Takes 1 1/2 qd) 180 tablet 1   sodium chloride  (OCEAN) 0.65 % SOLN nasal spray Place 1 spray into both nostrils as needed for congestion.     traMADol  (ULTRAM ) 50 MG tablet Take 1 tablet (50 mg total) by mouth daily as needed. 10 tablet 0   valACYclovir  (VALTREX ) 500 MG tablet Prophylaxis with 1 tab PO daily 90 tablet 4   valACYclovir  (VALTREX ) 500 MG tablet Take 1 tablet (500 mg total) by mouth 2 (two) times daily.  (Patient not taking: Reported on 08/18/2024) 30 tablet 12   Current Facility-Administered Medications on File Prior to Visit  Medication Dose Route Frequency Provider Last Rate Last Admin   Romosozumab -aqqg (EVENITY ) 105 MG/1. 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  Tobacco Use   Smoking status: Former    Current packs/day: 0.00    Average packs/day: 1.5 packs/day for 25.0 years (37.5 ttl pk-yrs)    Types: Cigarettes    Start date: 10/01/1971    Quit date: 09/30/1996    Years since quitting: 27.9   Smokeless tobacco: Never  Vaping Use   Vaping status: Never Used  Substance and Sexual Activity   Alcohol use: Not Currently   Drug use: No   Sexual activity: Not Currently    Partners: Male    Birth control/protection: Post-menopausal, Surgical    Comment: 1st intercourse- 16, partners- more than 5, btl  Other Topics Concern   Not on file  Social History Narrative   1 cup caffeine daily   Married   No children   Daughter of marjorie herndon   Social Drivers of Health   Financial Resource Strain: Low Risk  (04/22/2024)   Overall Financial Resource Strain (CARDIA)    Difficulty of Paying Living Expenses: Not very hard  Food Insecurity: No Food Insecurity (04/22/2024)   Hunger Vital Sign    Worried About Running Out of Food in the Last Year: Never true    Ran Out of Food in the Last Year: Never true  Transportation Needs: No Transportation Needs (04/22/2024)   PRAPARE - Administrator, Civil Service (Medical): No    Lack of Transportation (Non-Medical): No  Physical Activity: Insufficiently Active (04/22/2024)   Exercise Vital Sign    Days of Exercise per  Week: 2 days    Minutes of Exercise per Session: 20 min  Stress: Stress Concern Present (04/22/2024)   [employer] of Occupational Health - Occupational Stress Questionnaire  Feeling of Stress: To some extent  Social Connections: Moderately Isolated (04/22/2024)   Social Connection and Isolation Panel    Frequency of Communication with Friends and Family: Three times a week    Frequency of Social Gatherings with Friends and Family: Once a week    Attends Religious Services: Never    Database Administrator or Organizations: No    Attends Engineer, Structural: Not on file    Marital Status: Married  Catering Manager Violence: Not on file    Family History  Problem Relation Age of Onset   COPD Mother    Allergies Mother    Heart disease Mother    Atrial fibrillation Mother    Heart failure Mother    Colon cancer Mother 67       Had 6 inches, resected.   Colon polyps Mother 91   Allergies Father    Rheum arthritis Father    Bladder Cancer Father    Prostate cancer Father 47   Cancer - Other Father 76       Bladder   COPD Paternal Uncle    Pancreatic cancer Paternal Grandmother 37   Colon polyps Brother 30   Asthma Other    Esophageal cancer Neg Hx    Stomach cancer Neg Hx    Rectal cancer Neg Hx    Breast cancer Neg Hx      Allergies  Allergen Reactions   Cat Dander    Dust Mite Extract       Patient's last menstrual period was No LMP recorded. Patient is postmenopausal..            Review of Systems Alls systems reviewed and are negative.     Physical Exam Constitutional:      Appearance: Normal appearance.  Genitourinary:     Vulva and urethral meatus normal.     No lesions in the vagina.     Right Labia: No rash, lesions or skin changes.    Left Labia: No lesions, skin changes or rash.    No vaginal discharge or tenderness.     No vaginal prolapse present.    Moderate vaginal atrophy present.     Right Adnexa: not tender, not  palpable and no mass present.    Left Adnexa: not tender, not palpable and no mass present.    No cervical motion tenderness or discharge.     Uterus is not enlarged, tender or irregular.  Breasts:    Right: Normal.     Left: Normal.  HENT:     Head: Normocephalic.  Neck:     Thyroid : No thyroid  mass, thyromegaly or thyroid  tenderness.  Cardiovascular:     Rate and Rhythm: Normal rate and regular rhythm.     Heart sounds: Normal heart sounds, S1 normal and S2 normal.  Pulmonary:     Effort: Pulmonary effort is normal.     Breath sounds: Normal breath sounds and air entry.  Abdominal:     General: There is no distension.     Palpations: Abdomen is soft. There is no mass.     Tenderness: There is no abdominal tenderness. There is no guarding or rebound.  Musculoskeletal:        General: Normal range of motion.     Cervical back: Full passive range of motion without pain, normal range of motion and neck supple. No tenderness.     Right lower leg: No edema.     Left lower leg: No  edema.  Neurological:     Mental Status: Monique [name] is alert.  Skin:    General: Skin is warm.  Psychiatric:        Mood and Affect: Mood normal.        Behavior: Behavior normal.        Thought Content: Thought content normal.  Vitals and nursing note reviewed. Exam conducted with a chaperone present.       1. Well Woman GYN exam                              P:        Pap smear not indicated to complete next year Encouraged annual mammogram screening Colon cancer screening referral placed today  Labs and immunizations to do with PMD Discussed breast self exams Encouraged healthy lifestyle practices   2. Atrophic vaginitis: to begin vaginal estrogen.  Counseled on importance  3. Osteoporosis with history of fall fracture: has stopped fosamax  and had side effects with it. To begin prolia. Discussed 20% copay with [employer] and Monique [name] would like to begin. Labs today.  Counseled on the r/b/a/I of the  medication and need for daily calcium and vit D.  If patient decides to change from prolia, will need to have a medication in mind to avoid risk of rebound fracture.  Monique [name] agreed. Message to [facility] for starting and managing. Encouraged ca and vit D Repeat dxa in 2026 20 minutes spent on reviewing records, imaging,  and one on one patient time and counseling patient and documentation Dr. Glennon   No follow-ups on file.  Monique [name]

## 2024-08-22 ENCOUNTER — Ambulatory Visit (HOSPITAL_BASED_OUTPATIENT_CLINIC_OR_DEPARTMENT_OTHER)
Admission: RE | Admit: 2024-08-22 | Discharge: 2024-08-22 | Disposition: A | Discharge status: Home / Self Care | Source: Ambulatory Visit | Attending: Obstetrics and Gynecology | Admitting: Obstetrics and Gynecology

## 2024-08-22 DIAGNOSIS — E049 Nontoxic goiter, unspecified: Secondary | ICD-10-CM | POA: Diagnosis not present

## 2024-08-22 DIAGNOSIS — Z0389 Encounter for observation for other suspected diseases and conditions ruled out: Secondary | ICD-10-CM | POA: Diagnosis not present

## 2024-08-23 ENCOUNTER — Other Ambulatory Visit (HOSPITAL_COMMUNITY): Payer: Self-pay

## 2024-08-23 ENCOUNTER — Other Ambulatory Visit: Payer: Self-pay | Admitting: *Deleted

## 2024-08-23 ENCOUNTER — Telehealth: Payer: Self-pay

## 2024-08-23 DIAGNOSIS — M8000XD Age-related osteoporosis with current pathological fracture, unspecified site, subsequent encounter for fracture with routine healing: Secondary | ICD-10-CM

## 2024-08-23 MED ORDER — DENOSUMAB 60 MG/ML ~~LOC~~ SOSY: 60.0000 mg | PREFILLED_SYRINGE | Freq: Once | SUBCUTANEOUS | Status: AC

## 2024-08-23 NOTE — Telephone Encounter (Signed)
 Monique [name]

## 2024-08-23 NOTE — Telephone Encounter (Addendum)
 Prolia  VOB initiated via [email or web address]  Next Prolia  inj DUE: NEW START   PHARMACY: $793.31

## 2024-08-23 NOTE — Telephone Encounter (Signed)
 MEDICAL PA SUBMITTED VIA LATENT. KEY: [id number]

## 2024-08-24 ENCOUNTER — Ambulatory Visit: Payer: Self-pay | Admitting: Obstetrics and Gynecology

## 2024-08-24 NOTE — Telephone Encounter (Signed)
 MEDICAL PA APPROVED

## 2024-08-24 NOTE — Telephone Encounter (Signed)
 See referral

## 2024-08-24 NOTE — Telephone Encounter (Signed)
 Buy/Bill (Office supplied medication)  Out-of-pocket cost due at time of clinic visit: $347  Number of injection/visits approved: 2  Primary: [employer] Co-insurance: 20% Admin fee co-insurance: $15  Secondary: --- Co-insurance:  Admin fee co-insurance:   Medical Benefit Details: Date Benefits were checked: 08/23/24 Deductible: NO/ Coinsurance: 20%/ Admin Fee: $15  Prior Auth: APPROVED PA# [id number] Expiration Date: 08/24/24-09/29/25  # of doses approved: 2 -----------------------------------------------------------------------  Patient NOT eligible for Copay Card. Copay Card can make patient's cost as little as $25. Link to apply: [email or web address]  ** This summary of benefits is an estimation of the patient's out-of-pocket cost. Exact cost may very based on individual plan coverage.

## 2024-08-25 ENCOUNTER — Other Ambulatory Visit: Payer: Self-pay | Admitting: Obstetrics and Gynecology

## 2024-08-25 DIAGNOSIS — Z8619 Personal history of other infectious and parasitic diseases: Secondary | ICD-10-CM

## 2024-08-25 NOTE — Telephone Encounter (Signed)
 Med refill request: valacyclovir  500mg  Dx: genital herpes Last AEX: 08/18/24 Next AEX: 08/22/25 Last MMG (if hormonal med) n/a Last ordered: 1 year ago (08/19/2023) by Almarie MARLA Carpen, MD    Last refill: 07/29/2024  Refill authorized: Please Advise?

## 2024-09-07 ENCOUNTER — Encounter: Payer: Self-pay | Admitting: Adult Health

## 2024-09-07 ENCOUNTER — Telehealth: Admitting: Adult Health

## 2024-09-07 DIAGNOSIS — F3181 Bipolar II disorder: Secondary | ICD-10-CM | POA: Diagnosis not present

## 2024-09-07 DIAGNOSIS — G4700 Insomnia, unspecified: Secondary | ICD-10-CM

## 2024-09-07 DIAGNOSIS — F411 Generalized anxiety disorder: Secondary | ICD-10-CM

## 2024-09-07 MED ORDER — SERTRALINE HCL 100 MG PO TABS: ORAL_TABLET | ORAL | 1 refills | Status: AC

## 2024-09-07 MED ORDER — LORAZEPAM 1 MG PO TABS: ORAL_TABLET | ORAL | 2 refills | Status: AC

## 2024-09-07 NOTE — Progress Notes (Signed)
 Monique [name] [id number] 1956/07/23 68 y.o.  Virtual Visit via Video Note  I connected with pt @ on 09/07/24 at  2:30 PM EST by a video enabled telemedicine application and verified that I am speaking with the correct person using two identifiers.   I discussed the limitations of evaluation and management by telemedicine and the availability of in person appointments. The patient expressed understanding and agreed to proceed.  I discussed the assessment and treatment plan with the patient. The patient was provided an opportunity to ask questions and all were answered. The patient agreed with the plan and demonstrated an understanding of the instructions.   The patient was advised to call back or seek an in-person evaluation if the symptoms worsen or if the condition fails to improve as anticipated.  I provided 20 minutes of non-face-to-face time during this encounter.  The patient was located at home.  The provider was located at [facility].   Angeline LOISE Sayers, NP   Subjective:   Patient ID:  Monique Manwarren. [name] is a 68 y.o. (DOB [birth year]) female.  Chief Complaint: No chief complaint on file.   HPI Monique [name] presents for follow-up of GAD, insomnia and BPD2.  Describes mood today as ok Pleasant. Tearful at times. Mood symptoms - reports some anxiety and depression. Denies irritability. Reports lower interest and motivation. Denies panic attacks. Reports worry, rumination and over thinking. Reports mood is stable. Stating I feel like I'm doing ok. Feels like her current medication regimen works well. Taking medications as prescribed. Energy levels not good - bursts of energy. Active, does not have a regular exercise routine. Enjoys some usual interests and activities. Married. Lives with husband. Family local. Spending time with family. Appetite adequate. Weight gain 139 pounds - 67. Sleeps well most nights. Averages 8 to 9 hours. Focus and  concentration stable. Completing tasks. Managing aspects of household. Working part-time.  Denies SI or HI.  Denies AH or VH. Denies self harm. Denies substance use.  Previous medication trials: Denies  Review of Systems:  Review of Systems  Musculoskeletal:  Negative for gait problem.  Neurological:  Negative for tremors.  Psychiatric/Behavioral:         Please refer to HPI    Medications: I have reviewed the patient's current medications.  Current Outpatient Medications  Medication Sig Dispense Refill   Azelastine HCl 137 MCG/SPRAY SOLN USE 1 TO 2 SPRAYS IN EACH NOSTRIL TWICE DAILY AS NEEDED     Biotin w/ Vitamins C & E (HAIR/SKIN/NAILS PO) Take by mouth.     Calcium Carbonate-Vit D-Min (CALCIUM 1200 PO) Take by mouth. Take 1 tab once daily.     Cholecalciferol 50 MCG (2000 UT) TABS Take 1 tablet by mouth daily.     estradiol  (ESTRACE ) 0.01 % CREA vaginal cream Place 1 Applicatorful vaginally at bedtime. Do not use an applicator. Rub a dime size amount in the vagina nightly for 2 months then 3-4 times a week thereafter 42.5 g 12   levocetirizine (XYZAL) 5 MG tablet SMARTSIG:1 Tablet(s) By Mouth Every Evening     LORazepam  (ATIVAN ) 1 MG tablet TAKE 1 TO 2 TABLETS(1 TO 2 MG) BY MOUTH AT BEDTIME 60 tablet 1   MELATONIN PO Take 2 mg by mouth.     metoprolol  succinate (TOPROL  XL) 50 MG 24 hr tablet Take 1 tablet (50 mg total) by mouth 2 (two) times daily. Take with or immediately following a meal. 180 tablet 3   sertraline  (ZOLOFT ) 100  MG tablet Take two tablets daily. (Patient taking differently: Takes 1 1/2 qd) 180 tablet 1   sodium chloride  (OCEAN) 0.65 % SOLN nasal spray Place 1 spray into both nostrils as needed for congestion.     traMADol  (ULTRAM ) 50 MG tablet Take 1 tablet (50 mg total) by mouth daily as needed. 10 tablet 0   valACYclovir  (VALTREX ) 500 MG tablet PROPHYLAXIS WITH 1 TABLET BY MOUTH DAILY 90 tablet 3   Current Facility-Administered Medications  Medication Dose  Route Frequency Provider Last Rate Last Admin   denosumab  (PROLIA ) injection 60 mg  60 mg Subcutaneous Once Boswell, Elizabeth K, MD       Romosozumab -aqqg (EVENITY ) 105 MG/1. injection 210 mg  210 mg Subcutaneous Once         Medication Side Effects: None  Allergies:  Allergies  Allergen Reactions   Cat Dander    Dust Mite Extract     Past Medical History:  Diagnosis Date   Abnormal electrocardiogram    Allergic rhinitis    Allergy    seasonal allergies   Anxiety    on meds   Arthritis    R hip and left thumb   Bipolar disorder (HCC)    Cancer (HCC)    basal skin cell CA-1979   Cataracts, bilateral    COVID    Depression    some- on meds   GERD (gastroesophageal reflux disease)    not on meds at this time   History of hepatitis C    Hep B also   History of skin cancer 1980   HSV infection    Memory loss    MVP (mitral valve prolapse)    Osteoporosis    Paresthesia    Rib fracture    from fall   SVT (supraventricular tachycardia)    Unspecified chronic bronchitis (HCC)     Family History  Problem Relation Age of Onset   COPD Mother    Allergies Mother    Heart disease Mother    Atrial fibrillation Mother    Heart failure Mother    Colon cancer Mother 6       Had 6 inches, resected.   Colon polyps Mother 28   Allergies Father    Rheum arthritis Father    Bladder Cancer Father    Prostate cancer Father 89   Cancer - Other Father 44       Bladder   Multiple sclerosis Sister    Colon polyps Brother 77   Lupus Brother    COPD Paternal Uncle    Pancreatic cancer Paternal Grandmother 11   Asthma Other    Esophageal cancer Neg Hx    Stomach cancer Neg Hx    Rectal cancer Neg Hx    Breast cancer Neg Hx     Social History   Socioeconomic History   Marital status: Married    Spouse name: Not on file   Number of children: 0   Years of education: Not on file   Highest education level: GED or equivalent  Occupational History   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  Tobacco Use   Smoking status: Former    Current packs/day: 0.00    Average packs/day: 1.5 packs/day for 25.0 years (37.5 ttl pk-yrs)    Types: Cigarettes    Start date: 10/01/1971    Quit date: 09/30/1996    Years since quitting: 27.9   Smokeless tobacco: Never  Vaping Use   Vaping status: Never Used  Substance and Sexual Activity   Alcohol use: Not Currently   Drug use: No   Sexual activity: Not Currently    Partners: Male    Birth control/protection: Post-menopausal, Surgical    Comment: 1st intercourse- 16, partners- more than 5, btl  Other Topics Concern   Not on file  Social History Narrative   1 cup caffeine daily   Married   No children   Daughter of marjorie herndon   Social Drivers of Health   Financial Resource Strain: Low Risk  (04/22/2024)   Overall Financial Resource Strain (CARDIA)    Difficulty of Paying Living Expenses: Not very hard  Food Insecurity: No Food Insecurity (04/22/2024)   Hunger Vital Sign    Worried About Running Out of Food in the Last Year: Never true    Ran Out of Food in the Last Year: Never true  Transportation Needs: No Transportation Needs (04/22/2024)   PRAPARE - Administrator, Civil Service (Medical): No    Lack of Transportation (Non-Medical): No  Physical Activity: Insufficiently Active (04/22/2024)   Exercise Vital Sign    Days of Exercise per Week: 2 days    Minutes of Exercise per Session: 20 min  Stress: Stress Concern Present (04/22/2024)   [employer] of Occupational Health - Occupational Stress Questionnaire    Feeling of Stress: To some extent  Social Connections: Moderately Isolated (04/22/2024)   Social Connection and Isolation Panel    Frequency of Communication with Friends and Family: Three times a week    Frequency of Social Gatherings with Friends and Family: Once a week    Attends Religious Services: Never    Database Administrator or  Organizations: No    Attends Engineer, Structural: Not on file    Marital Status: Married  Catering Manager Violence: Not on file    Past Medical History, Surgical history, Social history, and Family history were reviewed and updated as appropriate.   Please see review of systems for further details on the patient's review from today.   Objective:   Physical Exam:  There were no vitals taken for this visit.  Physical Exam Constitutional:      General: She is not in acute distress. Musculoskeletal:        General: No deformity.  Neurological:     Mental Status: She is alert and oriented to person, place, and time.     Coordination: Coordination normal.  Psychiatric:        Attention and Perception: Attention and perception normal. She does not perceive auditory or visual hallucinations.        Mood and Affect: Mood normal. Mood is not anxious or depressed. Affect is not labile, blunt, angry or inappropriate.        Speech: Speech normal.        Behavior: Behavior normal.        Thought Content: Thought content normal. Thought content is not paranoid or delusional. Thought content does not include homicidal or suicidal ideation. Thought content does not include homicidal or suicidal plan.        Cognition and Memory: Cognition and memory normal.        Judgment: Judgment normal.     Comments: Insight intact     Lab Review:     Component Value Date/Time   NA 137 04/27/2024 1508   K 4.9 04/27/2024 1508   CL 101 04/27/2024 1508  CO2 30 04/27/2024 1508   GLUCOSE 79 04/27/2024 1508   BUN 17 04/27/2024 1508   CREATININE 0.84 04/27/2024 1508   CREATININE 0.76 04/27/2020 1446   CALCIUM 9.3 04/27/2024 1508   PROT 7.4 04/27/2024 1508   ALBUMIN 4.4 04/27/2024 1508   AST 21 04/27/2024 1508   ALT 13 04/27/2024 1508   ALKPHOS 56 04/27/2024 1508   BILITOT 0.4 04/27/2024 1508   GFRNONAA 92.72 08/17/2009 1022   GFRAA 113 08/03/2007 1638       Component Value Date/Time    WBC 6.5 04/27/2024 1508   RBC 4.36 04/27/2024 1508   HGB 13.6 04/27/2024 1508   HCT 40.8 04/27/2024 1508   PLT 220.0 04/27/2024 1508   MCV 93.7 04/27/2024 1508   MCH 31.6 04/27/2020 1446   MCHC 33.4 04/27/2024 1508   RDW 14.2 04/27/2024 1508   LYMPHSABS 1.5 07/29/2014 1117   MONOABS 0.5 07/29/2014 1117   EOSABS 0.1 07/29/2014 1117   BASOSABS 0.0 07/29/2014 1117    No results found for: POCLITH, LITHIUM   No results found for: PHENYTOIN, PHENOBARB, VALPROATE, CBMZ   .res Assessment: Plan:    Plan:  PDMP reviewed  Ativan  1mg  - take one and 1/2 tabs daily as needed.  Increase Zoloft  150mg  to 200mg  daily  20 minutes spent dedicated to the care of this patient on the date of this encounter to include pre-visit review of records, ordering of medication, post visit documentation, and face-to-face time with the patient discussing GAD, insomnia and BPD2. Discussed continuing current medication regimen.  Discussed potential benefits, risk, and side effects of benzodiazepines to include potential risk of tolerance and dependence, as well as possible drowsiness. Advised patient not to drive if experiencing drowsiness and to take lowest possible effective dose to minimize risk of dependence and tolerance.   RTC 6 months  Diagnoses and all orders for this visit:  Bipolar II disorder (HCC)  Generalized anxiety disorder  Insomnia, unspecified type     Please see After Visit Summary for patient specific instructions.  Future Appointments  Date Time Provider Department Center  10/05/2024  2:00 PM Glennon Almarie POUR, MD [facility] None  03/29/2025  3:00 PM Orman Erminio POUR, PA-C [facility] None  08/22/2025  3:00 PM Glennon Almarie POUR, MD [facility] None    No orders of the defined types were placed in this encounter.     -------------------------------

## 2024-09-21 ENCOUNTER — Telehealth: Payer: Self-pay | Admitting: Psychiatry

## 2024-09-21 NOTE — Telephone Encounter (Signed)
 Called pharmacy and she still has a RF, said they had filled it and put it back, but script is still active. Asked them to fill.

## 2024-09-21 NOTE — Telephone Encounter (Signed)
 Patient called in for refill on Setraline 100mg . States that she canceled last prescription at pharmacy because she thought it was to soon for a refill. Pls resend prescription to [facility] [name] Ph: [phone]

## 2024-10-05 ENCOUNTER — Ambulatory Visit: Admitting: Obstetrics and Gynecology

## 2024-10-05 ENCOUNTER — Other Ambulatory Visit (HOSPITAL_COMMUNITY): Payer: Self-pay

## 2024-10-05 ENCOUNTER — Encounter: Payer: Self-pay | Admitting: Obstetrics and Gynecology

## 2024-10-05 VITALS — BP 108/68 | HR 89 | Ht 66.0 in | Wt 138.0 lb

## 2024-10-05 DIAGNOSIS — M810 Age-related osteoporosis without current pathological fracture: Secondary | ICD-10-CM

## 2024-10-05 DIAGNOSIS — M8080XS Other osteoporosis with current pathological fracture, unspecified site, sequela: Secondary | ICD-10-CM

## 2024-10-05 DIAGNOSIS — Z1231 Encounter for screening mammogram for malignant neoplasm of breast: Secondary | ICD-10-CM

## 2024-10-05 DIAGNOSIS — N951 Menopausal and female climacteric states: Secondary | ICD-10-CM

## 2024-10-05 MED ORDER — ESTRADIOL 10 MCG VA TABS: 1.0000 | ORAL_TABLET | VAGINAL | 11 refills | Status: AC

## 2024-10-05 NOTE — Progress Notes (Signed)
" ° °  Acute Office Visit  Subjective:    Patient ID: Monique [name]. Kronick, female    DOB: 10-08-1955, 69 y.o.   MRN: [id number]   HPI 69 y.o. presents today for office visit (Pt has Osteo meds questions) .has not been on fosamax  for a little over a year. Is considering prolia  Dxa with no improvement on fosamax  2022 -2.4 and in 2024 -2.4 History of fall with rib fracture more recently. Tripped on an acorn. H/o fall and arm fracture as well She is also concerned with feeling menopausal symptoms again such as fatigue, insomnia (anxiety at bedtime), hair thinning, central increased abdominal weight, sugar cravings, mental fogginess complaints.  Has not started the vaginal estrogen. Would like to do the vaginal estrogen tablet  No LMP recorded. Patient is postmenopausal.    Review of Systems     Objective:    OBGyn Exam  BP 108/68 (BP Location: Left Arm, Patient Position: Sitting, Cuff Size: Normal)   Pulse 89   Ht 5' 6 (1.676 m)   Wt 138 lb (62.6 kg)   SpO2 97%   BMI 22.27 kg/m  Wt Readings from Last 3 Encounters:  10/05/24 138 lb (62.6 kg)  08/18/24 139 lb (63 kg)  04/27/24 134 lb (60.8 kg)        Assessment & Plan:  Osteoporosis: borderline with history of fractures and no improvement on fosamax . Reviewed options with prolia  and r/b/a/I discussed. Encouraged continued use and if she desires to stop, of having another bone support medication on board. Continue calcium and Vit D.  Begin centrium womens over 50 vit daily. WLP and Damien message sent to run [place] benefits for prolia . Discussed trying vaginal estrogen first. Risks reviewed with late state on HRT. If she desires to still begin, would do a very low estrogen patch and prometrium at bedtime. Counseled on need for progesterone since she has a uterus with either oral or patch estrogen use to prevent endometrial cancer. She will let us  know. Referral placed for annual MMG  30 minutes spent on reviewing records, imaging,  and  one on one patient time and counseling patient and documentation Dr. Glennon Almarie MARLA Glennon  "

## 2024-10-05 NOTE — Telephone Encounter (Signed)
 Prolia  VOB initiated via [email or web address]  Next Prolia  inj DUE: NEW START   PHARMACY COPAY: $977.36

## 2024-10-05 NOTE — Progress Notes (Signed)
 Pt has Osteo meds questions. Concerned with weight gain from menopause.

## 2024-10-05 NOTE — Telephone Encounter (Addendum)
-----   Message -----  From: Glennon Almarie POUR, MD  Sent: 10/05/2024   3:07 PM EST  To: Damien ONEIDA Mau, RN; Annabella LITTIE Stagger, CPhT   Patient would like to run benefits for prolia   Dxa -2.4 with >1 fracture.  More recently fell on an acorn and had a rib fracture  History of fall and fracture in arm as well   Stopped fosamax  >1 yr ago and was not showing any benefit to her dxa  Thank you  Dr. Glennon

## 2024-10-07 ENCOUNTER — Other Ambulatory Visit: Payer: Self-pay | Admitting: *Deleted

## 2024-10-07 MED ORDER — DENOSUMAB 60 MG/ML ~~LOC~~ SOSY
60.0000 mg | PREFILLED_SYRINGE | Freq: Once | SUBCUTANEOUS | Status: AC
  Administered 2024-10-27: 60 mg via SUBCUTANEOUS

## 2024-10-08 NOTE — Telephone Encounter (Signed)
 Buy/Bill (Office supplied medication)   Out-of-pocket cost due at time of clinic visit: $377 - [employer] cannot give estimate, following Medicare Advantage guideline (20% coinsurance, 20% admin fee)   Number of injection/visits approved: 2   Primary: [employer] Co-insurance: 20% Admin fee co-insurance: 20%   Secondary: --- Co-insurance:  Admin fee co-insurance:    Medical Benefit Details: Date Benefits were checked: 10/08/24 Deductible: NO/ Coinsurance: 20%/ Admin Fee: 20%   Prior Auth: APPROVED PA# [id number] Expiration Date: 08/24/24-09/29/25  # of doses approved: 2 -----------------------------------------------------------------------   Patient NOT eligible for Copay Card. Copay Card can make patient's cost as little as $25. Link to apply: [email or web address]://[email or web address]   ** This summary of benefits is an estimation of the patient's out-of-pocket cost. Exact cost may very based on individual plan coverage.

## 2024-10-08 NOTE — Telephone Encounter (Signed)
" ° °  [employer] cannot give estimate, following Medicare Advantage guideline (20% coinsurance, 20% admin fee)   "

## 2024-10-13 NOTE — Telephone Encounter (Signed)
 Message left to return call to [employer] at [phone], option #4.

## 2024-10-26 ENCOUNTER — Other Ambulatory Visit: Payer: Self-pay | Admitting: Obstetrics and Gynecology

## 2024-10-26 ENCOUNTER — Encounter: Payer: Self-pay | Admitting: Obstetrics and Gynecology

## 2024-10-26 ENCOUNTER — Telehealth: Payer: Self-pay

## 2024-10-26 ENCOUNTER — Other Ambulatory Visit: Payer: Self-pay | Admitting: *Deleted

## 2024-10-26 DIAGNOSIS — M8080XS Other osteoporosis with current pathological fracture, unspecified site, sequela: Secondary | ICD-10-CM

## 2024-10-26 DIAGNOSIS — M816 Localized osteoporosis [Lequesne]: Secondary | ICD-10-CM

## 2024-10-26 MED ORDER — DENOSUMAB 60 MG/ML ~~LOC~~ SOSY: 60.0000 mg | PREFILLED_SYRINGE | SUBCUTANEOUS | 6 refills | Status: AC

## 2024-10-26 NOTE — Telephone Encounter (Signed)
 Pt called in asking for an order for a dexa scan to be placed, she would like this done at [facility].   Please advise   Last appt 10/05/24

## 2024-10-27 ENCOUNTER — Ambulatory Visit

## 2024-10-27 DIAGNOSIS — M810 Age-related osteoporosis without current pathological fracture: Secondary | ICD-10-CM

## 2024-10-27 NOTE — Telephone Encounter (Signed)
 Per review of EPIC, last BMD was 09/02/23.   Planning for Prolia .   Is is patient repeating now or 08/2025?

## 2024-10-27 NOTE — Telephone Encounter (Signed)
 Patient says she does her 1st prolia  injection today. She will do her bmd in 73yr. Order is placed.

## 2025-03-29 ENCOUNTER — Ambulatory Visit: Admitting: Physician Assistant

## 2025-08-22 ENCOUNTER — Encounter: Admitting: Obstetrics and Gynecology
